# Patient Record
Sex: Male | Born: 1975
Health system: Southern US, Community
[De-identification: ages and names within clinical notes are randomized; demographics above are authoritative.]

## PROBLEM LIST (undated history)

## (undated) DIAGNOSIS — I700 Atherosclerosis of aorta: Secondary | ICD-10-CM

## (undated) DIAGNOSIS — E876 Hypokalemia: Secondary | ICD-10-CM

## (undated) DIAGNOSIS — K760 Fatty (change of) liver, not elsewhere classified: Secondary | ICD-10-CM

## (undated) DIAGNOSIS — R945 Abnormal results of liver function studies: Secondary | ICD-10-CM

## (undated) DIAGNOSIS — Z87442 Personal history of urinary calculi: Secondary | ICD-10-CM

## (undated) DIAGNOSIS — Z720 Tobacco use: Secondary | ICD-10-CM

## (undated) DIAGNOSIS — I10 Essential (primary) hypertension: Secondary | ICD-10-CM

## (undated) DIAGNOSIS — J45909 Unspecified asthma, uncomplicated: Secondary | ICD-10-CM

## (undated) DIAGNOSIS — Z7289 Other problems related to lifestyle: Secondary | ICD-10-CM

## (undated) DIAGNOSIS — F419 Anxiety disorder, unspecified: Secondary | ICD-10-CM

## (undated) DIAGNOSIS — F1090 Alcohol use, unspecified, uncomplicated: Secondary | ICD-10-CM

## (undated) DIAGNOSIS — R7989 Other specified abnormal findings of blood chemistry: Secondary | ICD-10-CM

---
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
## 1997-11-16 ENCOUNTER — Emergency Department (HOSPITAL_COMMUNITY): Admission: EM | Admit: 1997-11-16 | Discharge: 1997-11-16 | Payer: Self-pay | Admitting: Emergency Medicine

## 1998-01-13 ENCOUNTER — Emergency Department (HOSPITAL_COMMUNITY): Admission: EM | Admit: 1998-01-13 | Discharge: 1998-01-13 | Payer: Self-pay | Admitting: Emergency Medicine

## 1998-03-29 ENCOUNTER — Emergency Department (HOSPITAL_COMMUNITY): Admission: EM | Admit: 1998-03-29 | Discharge: 1998-03-30 | Payer: Self-pay

## 1998-04-10 ENCOUNTER — Emergency Department (HOSPITAL_COMMUNITY): Admission: EM | Admit: 1998-04-10 | Discharge: 1998-04-10 | Payer: Self-pay | Admitting: Emergency Medicine

## 1999-06-22 ENCOUNTER — Emergency Department (HOSPITAL_COMMUNITY): Admission: EM | Admit: 1999-06-22 | Discharge: 1999-06-22 | Payer: Self-pay | Admitting: Emergency Medicine

## 1999-06-22 ENCOUNTER — Encounter: Payer: Self-pay | Admitting: Emergency Medicine

## 1999-07-28 ENCOUNTER — Ambulatory Visit (HOSPITAL_COMMUNITY): Admission: RE | Admit: 1999-07-28 | Discharge: 1999-07-28 | Payer: Self-pay | Admitting: Family Medicine

## 1999-07-28 ENCOUNTER — Encounter: Payer: Self-pay | Admitting: Family Medicine

## 2000-03-01 ENCOUNTER — Emergency Department (HOSPITAL_COMMUNITY): Admission: EM | Admit: 2000-03-01 | Discharge: 2000-03-01 | Payer: Self-pay | Admitting: Emergency Medicine

## 2000-05-09 ENCOUNTER — Emergency Department (HOSPITAL_COMMUNITY): Admission: EM | Admit: 2000-05-09 | Discharge: 2000-05-09 | Payer: Self-pay | Admitting: *Deleted

## 2000-05-09 ENCOUNTER — Encounter: Payer: Self-pay | Admitting: Emergency Medicine

## 2000-09-11 ENCOUNTER — Emergency Department (HOSPITAL_COMMUNITY): Admission: EM | Admit: 2000-09-11 | Discharge: 2000-09-11 | Payer: Self-pay | Admitting: *Deleted

## 2000-09-11 ENCOUNTER — Encounter: Payer: Self-pay | Admitting: Emergency Medicine

## 2001-01-02 ENCOUNTER — Emergency Department (HOSPITAL_COMMUNITY): Admission: EM | Admit: 2001-01-02 | Discharge: 2001-01-02 | Payer: Self-pay | Admitting: Emergency Medicine

## 2001-02-06 ENCOUNTER — Encounter: Payer: Self-pay | Admitting: Emergency Medicine

## 2001-02-06 ENCOUNTER — Emergency Department (HOSPITAL_COMMUNITY): Admission: EM | Admit: 2001-02-06 | Discharge: 2001-02-06 | Payer: Self-pay | Admitting: Emergency Medicine

## 2001-07-25 ENCOUNTER — Emergency Department (HOSPITAL_COMMUNITY): Admission: EM | Admit: 2001-07-25 | Discharge: 2001-07-26 | Payer: Self-pay | Admitting: Emergency Medicine

## 2001-07-25 ENCOUNTER — Encounter: Payer: Self-pay | Admitting: Emergency Medicine

## 2001-10-30 ENCOUNTER — Emergency Department (HOSPITAL_COMMUNITY): Admission: EM | Admit: 2001-10-30 | Discharge: 2001-10-30 | Payer: Self-pay | Admitting: Emergency Medicine

## 2001-10-30 ENCOUNTER — Encounter: Payer: Self-pay | Admitting: Emergency Medicine

## 2011-05-05 HISTORY — PX: BUNIONECTOMY: SHX129

## 2017-07-20 ENCOUNTER — Ambulatory Visit (HOSPITAL_COMMUNITY)
Admission: EM | Admit: 2017-07-20 | Discharge: 2017-07-20 | Disposition: A | Discharge status: Home / Self Care | Payer: Self-pay | Attending: Family Medicine | Admitting: Family Medicine

## 2017-07-20 ENCOUNTER — Encounter (HOSPITAL_COMMUNITY): Payer: Self-pay | Admitting: Emergency Medicine

## 2017-07-20 DIAGNOSIS — I10 Essential (primary) hypertension: Secondary | ICD-10-CM

## 2017-07-20 HISTORY — DX: Essential (primary) hypertension: I10

## 2017-07-20 LAB — POCT I-STAT, CHEM 8
BUN: 17 mg/dL (ref 6–20)
CHLORIDE: 103 mmol/L (ref 101–111)
CREATININE: 1.1 mg/dL (ref 0.61–1.24)
Calcium, Ion: 1.16 mmol/L (ref 1.15–1.40)
Glucose, Bld: 91 mg/dL (ref 65–99)
HCT: 47 % (ref 39.0–52.0)
Hemoglobin: 16 g/dL (ref 13.0–17.0)
POTASSIUM: 3.5 mmol/L (ref 3.5–5.1)
Sodium: 141 mmol/L (ref 135–145)
TCO2: 26 mmol/L (ref 22–32)

## 2017-07-20 MED ORDER — AMLODIPINE BESYLATE 5 MG PO TABS: 5.0000 mg | ORAL_TABLET | Freq: Every day | ORAL | 0 refills | Status: DC

## 2017-07-20 NOTE — Discharge Instructions (Signed)
Your electrolytes and kidney function was normal. Start amlodipine as directed. I have attached some information for it. Start DASH diet to further reduce high blood pressure. Monitor your blood pressure on the medicine, and keep a log. Follow up with PCP for further evaluation needed.

## 2017-07-20 NOTE — ED Provider Notes (Signed)
[facility]    CSN: [id number] Arrival date & time: 07/20/17  1815     History   Chief Complaint Chief Complaint  Patient presents with  . Hypertension    HPI Derrick Hoffman is a 42 y.o. male.   42 year old male comes in for evaluation of hypertension.  Patient states was at his psychiatrist office yesterday, and was noted to have elevated blood pressure reading.  States he went to the pharmacy afterwards and  had continued increased blood pressure.  States he was diagnosed with hypertension while he was in prison a few years ago, and stopped taking the medicine after he was released.  Has not seen a doctor since.  He is unsure what medication he was on.  States insurance kicks in next month, and will be finding PCP for further evaluation, but would like to start on a medication during this process.   Has intermittent left chest pain that he associates with anxiety. No obvious aggravating or alleviating factor. Not associated with activity. Denies current chest pain. Denies shortness of breath, palpitations. Denies weakness, dizziness, syncope.       Past Medical History:  Diagnosis Date  . Hypertension     There are no active problems to display for this patient.   History reviewed. No pertinent surgical history.     Home Medications    Prior to Admission medications   Medication Sig Start Date End Date Taking? Authorizing Provider  amLODipine (NORVASC) 5 MG tablet Take 1 tablet (5 mg total) by mouth daily. 07/20/17   Belinda Fisher, PA-C    Family History History reviewed. No pertinent family history.  Social History Social History   Tobacco Use  . Smoking status: Current Every Day Smoker  . Smokeless tobacco: Never Used  Substance Use Topics  . Alcohol use: Yes    Frequency: Never  . Drug use: No     Allergies   Patient has no known allergies.   Review of Systems Review of Systems  Reason unable to perform ROS: See HPI as above.      Physical Exam Triage Vital Signs ED Triage Vitals [07/20/17 1856]  Enc Vitals Group     BP (!) 166/100     Pulse Rate 82     Resp 18     Temp 98.2 F (36.8 C)     Temp Source Oral     SpO2 100 %     Weight      Height      Head Circumference      Peak Flow      Pain Score 0     Pain Loc      Pain Edu?      Excl. in GC?    No data found.  Updated Vital Signs BP (!) 166/100 (BP Location: Left Arm)   Pulse 82   Temp 98.2 F (36.8 C) (Oral)   Resp 18   SpO2 100%   Physical Exam  Constitutional: He is oriented to person, place, and time. He appears well-developed and well-nourished. No distress.  HENT:  Head: Normocephalic and atraumatic.  Eyes: Conjunctivae are normal. Pupils are equal, round, and reactive to light.  Cardiovascular: Normal rate, regular rhythm and normal heart sounds. Exam reveals no gallop and no friction rub.  No murmur heard. Pulmonary/Chest: Effort normal and breath sounds normal. No stridor. No respiratory distress. He has no decreased breath sounds. He has no wheezes. He has no rhonchi. He has  no rales.  Neurological: He is alert and oriented to person, place, and time.    UC Treatments / Results  Labs (all labs ordered are listed, but only abnormal results are displayed) Labs Reviewed  POCT I-STAT, CHEM 8    EKG  EKG Interpretation None       Radiology No results found.  Procedures Procedures (including critical care time)  Medications Ordered in UC Medications - No data to display   Initial Impression / Assessment and Plan / UC Course  I have reviewed the triage vital signs and the nursing notes.  Pertinent labs & imaging results that were available during my care of the patient were reviewed by me and considered in my medical decision making (see chart for details).    I-STAT without abnormalities.  Will have patient start Norvasc for hypertension.  Information about Norvasc provided.  DASH diet provided.  Patient to  document blood pressure on medication.  Follow-up with PCP for further evaluation management needed.  Final Clinical Impressions(s) / UC Diagnoses   Final diagnoses:  Hypertension, unspecified type    ED Discharge Orders        Ordered    amLODipine (NORVASC) 5 MG tablet  Daily     07/20/17 2002       Belinda Fisher, Cordelia Poche 07/20/17 2006

## 2017-07-20 NOTE — ED Triage Notes (Signed)
Pt here with htn; pt sts hx of same supposed to take meds but not taking

## 2017-09-18 ENCOUNTER — Emergency Department (HOSPITAL_COMMUNITY)
Admission: EM | Admit: 2017-09-18 | Discharge: 2017-09-18 | Disposition: A | Discharge status: Home / Self Care | Payer: Self-pay | Attending: Emergency Medicine | Admitting: Emergency Medicine

## 2017-09-18 ENCOUNTER — Encounter (HOSPITAL_COMMUNITY): Payer: Self-pay

## 2017-09-18 ENCOUNTER — Other Ambulatory Visit: Payer: Self-pay

## 2017-09-18 DIAGNOSIS — Z5321 Procedure and treatment not carried out due to patient leaving prior to being seen by health care provider: Secondary | ICD-10-CM | POA: Insufficient documentation

## 2017-09-18 DIAGNOSIS — R1110 Vomiting, unspecified: Secondary | ICD-10-CM | POA: Insufficient documentation

## 2017-09-18 MED ORDER — ONDANSETRON 4 MG PO TBDP
4.0000 mg | ORAL_TABLET | Freq: Once | ORAL | Status: AC | PRN
  Administered 2017-09-18: 4 mg via ORAL
  Filled 2017-09-18: qty 1

## 2017-09-18 NOTE — ED Triage Notes (Signed)
He c/o "vomited all night". He is in no distress. Zofran ODT given at triage.

## 2017-09-18 NOTE — ED Notes (Signed)
Called pt for room replacement no response x2.

## 2017-09-18 NOTE — ED Notes (Signed)
Called pt for room placement no response. x3 

## 2017-09-18 NOTE — ED Notes (Signed)
Pt called for room replacement no response.

## 2018-01-05 ENCOUNTER — Emergency Department (HOSPITAL_COMMUNITY): Payer: 59 | Admitting: Certified Registered Nurse Anesthetist

## 2018-01-05 ENCOUNTER — Ambulatory Visit (HOSPITAL_COMMUNITY)
Admission: EM | Admit: 2018-01-05 | Discharge: 2018-01-06 | Disposition: A | Discharge status: Home / Self Care | Payer: 59 | Attending: Internal Medicine | Admitting: Internal Medicine

## 2018-01-05 ENCOUNTER — Other Ambulatory Visit: Payer: Self-pay

## 2018-01-05 ENCOUNTER — Encounter (HOSPITAL_COMMUNITY)
Admission: EM | Disposition: A | Discharge status: Home / Self Care | Payer: Self-pay | Source: Home / Self Care | Attending: Emergency Medicine

## 2018-01-05 ENCOUNTER — Emergency Department (HOSPITAL_COMMUNITY): Payer: 59

## 2018-01-05 ENCOUNTER — Encounter (HOSPITAL_COMMUNITY): Payer: Self-pay | Admitting: Emergency Medicine

## 2018-01-05 DIAGNOSIS — Z79899 Other long term (current) drug therapy: Secondary | ICD-10-CM | POA: Insufficient documentation

## 2018-01-05 DIAGNOSIS — Z880 Allergy status to penicillin: Secondary | ICD-10-CM | POA: Diagnosis not present

## 2018-01-05 DIAGNOSIS — K3589 Other acute appendicitis without perforation or gangrene: Secondary | ICD-10-CM

## 2018-01-05 DIAGNOSIS — K37 Unspecified appendicitis: Secondary | ICD-10-CM | POA: Diagnosis present

## 2018-01-05 DIAGNOSIS — Z9119 Patient's noncompliance with other medical treatment and regimen: Secondary | ICD-10-CM | POA: Diagnosis not present

## 2018-01-05 DIAGNOSIS — K3533 Acute appendicitis with perforation and localized peritonitis, with abscess: Secondary | ICD-10-CM | POA: Diagnosis not present

## 2018-01-05 DIAGNOSIS — I700 Atherosclerosis of aorta: Secondary | ICD-10-CM | POA: Diagnosis not present

## 2018-01-05 DIAGNOSIS — K449 Diaphragmatic hernia without obstruction or gangrene: Secondary | ICD-10-CM | POA: Diagnosis not present

## 2018-01-05 DIAGNOSIS — I10 Essential (primary) hypertension: Secondary | ICD-10-CM | POA: Insufficient documentation

## 2018-01-05 DIAGNOSIS — K760 Fatty (change of) liver, not elsewhere classified: Secondary | ICD-10-CM | POA: Insufficient documentation

## 2018-01-05 DIAGNOSIS — F17200 Nicotine dependence, unspecified, uncomplicated: Secondary | ICD-10-CM | POA: Diagnosis not present

## 2018-01-05 HISTORY — DX: Anxiety disorder, unspecified: F41.9

## 2018-01-05 HISTORY — DX: Personal history of urinary calculi: Z87.442

## 2018-01-05 HISTORY — DX: Unspecified asthma, uncomplicated: J45.909

## 2018-01-05 HISTORY — PX: LAPAROSCOPIC APPENDECTOMY: SHX408

## 2018-01-05 HISTORY — PX: APPENDECTOMY: SHX54

## 2018-01-05 LAB — COMPREHENSIVE METABOLIC PANEL
ALT: 90 U/L — AB (ref 0–44)
ANION GAP: 16 — AB (ref 5–15)
AST: 83 U/L — ABNORMAL HIGH (ref 15–41)
Albumin: 5 g/dL (ref 3.5–5.0)
Alkaline Phosphatase: 78 U/L (ref 38–126)
BILIRUBIN TOTAL: 1.5 mg/dL — AB (ref 0.3–1.2)
BUN: 19 mg/dL (ref 6–20)
CO2: 24 mmol/L (ref 22–32)
Calcium: 10 mg/dL (ref 8.9–10.3)
Chloride: 100 mmol/L (ref 98–111)
Creatinine, Ser: 1.64 mg/dL — ABNORMAL HIGH (ref 0.61–1.24)
GFR calc Af Amer: 59 mL/min — ABNORMAL LOW (ref >60)
GFR calc non Af Amer: 50 mL/min — ABNORMAL LOW (ref >60)
Glucose, Bld: 122 mg/dL — ABNORMAL HIGH (ref 70–99)
POTASSIUM: 3.3 mmol/L — AB (ref 3.5–5.1)
Sodium: 140 mmol/L (ref 135–145)
Total Protein: 9 g/dL — ABNORMAL HIGH (ref 6.5–8.1)

## 2018-01-05 LAB — URINALYSIS, ROUTINE W REFLEX MICROSCOPIC
BACTERIA UA: NONE SEEN
Bilirubin Urine: NEGATIVE
GLUCOSE, UA: NEGATIVE mg/dL
Hgb urine dipstick: NEGATIVE
KETONES UR: 5 mg/dL — AB
Nitrite: NEGATIVE
PROTEIN: 100 mg/dL — AB
Specific Gravity, Urine: 1.028 (ref 1.005–1.030)
pH: 5 (ref 5.0–8.0)

## 2018-01-05 LAB — LIPASE, BLOOD: LIPASE: 45 U/L (ref 11–51)

## 2018-01-05 LAB — CBC
HEMATOCRIT: 54.1 % — AB (ref 39.0–52.0)
HEMOGLOBIN: 18 g/dL — AB (ref 13.0–17.0)
MCH: 32.6 pg (ref 26.0–34.0)
MCHC: 33.3 g/dL (ref 30.0–36.0)
MCV: 98 fL (ref 78.0–100.0)
Platelets: 284 10*3/uL (ref 150–400)
RBC: 5.52 MIL/uL (ref 4.22–5.81)
RDW: 14.3 % (ref 11.5–15.5)
WBC: 14 10*3/uL — ABNORMAL HIGH (ref 4.0–10.5)

## 2018-01-05 SURGERY — APPENDECTOMY, LAPAROSCOPIC
Anesthesia: General | Site: Abdomen

## 2018-01-05 MED ORDER — SODIUM CHLORIDE 0.9 % IV SOLN
2.0000 g | Freq: Once | INTRAVENOUS | Status: AC
  Administered 2018-01-05: 2 g via INTRAVENOUS
  Filled 2018-01-05: qty 20

## 2018-01-05 MED ORDER — HYDROCODONE-ACETAMINOPHEN 5-325 MG PO TABS
1.0000 | ORAL_TABLET | ORAL | Status: DC | PRN
  Administered 2018-01-05: 2 via ORAL
  Administered 2018-01-05: 1 via ORAL
  Administered 2018-01-06 (×2): 2 via ORAL
  Filled 2018-01-05: qty 1
  Filled 2018-01-05 (×4): qty 2

## 2018-01-05 MED ORDER — PANTOPRAZOLE SODIUM 40 MG PO TBEC
40.0000 mg | DELAYED_RELEASE_TABLET | Freq: Every day | ORAL | Status: DC
  Administered 2018-01-05 – 2018-01-06 (×2): 40 mg via ORAL
  Filled 2018-01-05 (×2): qty 1

## 2018-01-05 MED ORDER — FAMOTIDINE IN NACL 20-0.9 MG/50ML-% IV SOLN: 20.0000 mg | Freq: Two times a day (BID) | INTRAVENOUS | Status: DC

## 2018-01-05 MED ORDER — FENTANYL CITRATE (PF) 250 MCG/5ML IJ SOLN
INTRAMUSCULAR | Status: DC | PRN
  Administered 2018-01-05 (×2): 100 ug via INTRAVENOUS
  Administered 2018-01-05: 50 ug via INTRAVENOUS

## 2018-01-05 MED ORDER — POTASSIUM CHLORIDE IN NACL 40-0.9 MEQ/L-% IV SOLN: INTRAVENOUS | Status: DC

## 2018-01-05 MED ORDER — ROCURONIUM BROMIDE 10 MG/ML (PF) SYRINGE
PREFILLED_SYRINGE | INTRAVENOUS | Status: DC | PRN
  Administered 2018-01-05: 50 mg via INTRAVENOUS

## 2018-01-05 MED ORDER — SUGAMMADEX SODIUM 200 MG/2ML IV SOLN
INTRAVENOUS | Status: DC | PRN
  Administered 2018-01-05: 200 mg via INTRAVENOUS

## 2018-01-05 MED ORDER — BUPIVACAINE-EPINEPHRINE 0.25% -1:200000 IJ SOLN
INTRAMUSCULAR | Status: DC | PRN
  Administered 2018-01-05: 20 mL

## 2018-01-05 MED ORDER — LIDOCAINE 2% (20 MG/ML) 5 ML SYRINGE
INTRAMUSCULAR | Status: DC | PRN
  Administered 2018-01-05: 60 mg via INTRAVENOUS

## 2018-01-05 MED ORDER — METOPROLOL TARTRATE 5 MG/5ML IV SOLN
5.0000 mg | INTRAVENOUS | Status: DC | PRN
  Administered 2018-01-05 (×4): 5 mg via INTRAVENOUS

## 2018-01-05 MED ORDER — MORPHINE SULFATE (PF) 4 MG/ML IV SOLN
4.0000 mg | Freq: Once | INTRAVENOUS | Status: AC
  Administered 2018-01-05: 4 mg via INTRAVENOUS
  Filled 2018-01-05: qty 1

## 2018-01-05 MED ORDER — DIPHENHYDRAMINE HCL 50 MG/ML IJ SOLN: 25.0000 mg | Freq: Four times a day (QID) | INTRAMUSCULAR | Status: DC | PRN

## 2018-01-05 MED ORDER — METOPROLOL TARTRATE 5 MG/5ML IV SOLN
INTRAVENOUS | Status: AC
  Filled 2018-01-05: qty 5

## 2018-01-05 MED ORDER — LORAZEPAM 2 MG/ML IJ SOLN: 0.5000 mg | Freq: Once | INTRAMUSCULAR | Status: DC

## 2018-01-05 MED ORDER — HYDRALAZINE HCL 20 MG/ML IJ SOLN: 5.0000 mg | INTRAMUSCULAR | Status: DC | PRN

## 2018-01-05 MED ORDER — ONDANSETRON HCL 4 MG/2ML IJ SOLN
INTRAMUSCULAR | Status: AC
Start: 2018-01-05 — End: 2018-01-05
  Filled 2018-01-05: qty 2

## 2018-01-05 MED ORDER — FENTANYL CITRATE (PF) 250 MCG/5ML IJ SOLN
INTRAMUSCULAR | Status: AC
  Filled 2018-01-05: qty 5

## 2018-01-05 MED ORDER — SUCCINYLCHOLINE CHLORIDE 200 MG/10ML IV SOSY
PREFILLED_SYRINGE | INTRAVENOUS | Status: DC | PRN
  Administered 2018-01-05: 100 mg via INTRAVENOUS

## 2018-01-05 MED ORDER — METOPROLOL TARTRATE 25 MG PO TABS
25.0000 mg | ORAL_TABLET | Freq: Two times a day (BID) | ORAL | Status: DC
  Administered 2018-01-05 – 2018-01-06 (×3): 25 mg via ORAL
  Filled 2018-01-05 (×3): qty 1

## 2018-01-05 MED ORDER — ONDANSETRON 4 MG PO TBDP: 4.0000 mg | ORAL_TABLET | Freq: Four times a day (QID) | ORAL | Status: DC | PRN

## 2018-01-05 MED ORDER — METRONIDAZOLE IN NACL 5-0.79 MG/ML-% IV SOLN
500.0000 mg | Freq: Once | INTRAVENOUS | Status: AC
  Administered 2018-01-05: 500 mg via INTRAVENOUS
  Filled 2018-01-05 (×2): qty 100

## 2018-01-05 MED ORDER — SERTRALINE HCL 50 MG PO TABS
50.0000 mg | ORAL_TABLET | Freq: Every day | ORAL | Status: DC
  Administered 2018-01-05 – 2018-01-06 (×2): 50 mg via ORAL
  Filled 2018-01-05 (×2): qty 1

## 2018-01-05 MED ORDER — HEPARIN SODIUM (PORCINE) 5000 UNIT/ML IJ SOLN
5000.0000 [IU] | Freq: Three times a day (TID) | INTRAMUSCULAR | Status: DC
  Administered 2018-01-06: 5000 [IU] via SUBCUTANEOUS
  Filled 2018-01-05: qty 1

## 2018-01-05 MED ORDER — HYDRALAZINE HCL 20 MG/ML IJ SOLN
INTRAMUSCULAR | Status: AC
  Filled 2018-01-05: qty 1

## 2018-01-05 MED ORDER — 0.9 % SODIUM CHLORIDE (POUR BTL) OPTIME
TOPICAL | Status: DC | PRN
  Administered 2018-01-05: 1000 mL

## 2018-01-05 MED ORDER — SODIUM CHLORIDE 0.9 % IV BOLUS
1000.0000 mL | Freq: Once | INTRAVENOUS | Status: AC
  Administered 2018-01-05: 1000 mL via INTRAVENOUS

## 2018-01-05 MED ORDER — HYDROXYZINE HCL 50 MG PO TABS
50.0000 mg | ORAL_TABLET | Freq: Three times a day (TID) | ORAL | Status: DC | PRN
  Administered 2018-01-05: 50 mg via ORAL
  Filled 2018-01-05 (×2): qty 1

## 2018-01-05 MED ORDER — FAMOTIDINE IN NACL 20-0.9 MG/50ML-% IV SOLN
20.0000 mg | Freq: Two times a day (BID) | INTRAVENOUS | Status: DC
  Administered 2018-01-05: 20 mg via INTRAVENOUS
  Filled 2018-01-05: qty 50

## 2018-01-05 MED ORDER — HYDROMORPHONE HCL 1 MG/ML IJ SOLN
0.5000 mg | Freq: Once | INTRAMUSCULAR | Status: AC
  Administered 2018-01-05: 0.5 mg via INTRAVENOUS
  Filled 2018-01-05: qty 1

## 2018-01-05 MED ORDER — HYDRALAZINE HCL 20 MG/ML IJ SOLN
10.0000 mg | INTRAMUSCULAR | Status: DC | PRN
  Administered 2018-01-05: 5 mg via INTRAVENOUS
  Filled 2018-01-05: qty 1

## 2018-01-05 MED ORDER — METOPROLOL TARTRATE 5 MG/5ML IV SOLN
INTRAVENOUS | Status: AC
  Filled 2018-01-05: qty 10

## 2018-01-05 MED ORDER — AMLODIPINE BESYLATE 5 MG PO TABS
5.0000 mg | ORAL_TABLET | Freq: Every day | ORAL | Status: DC
  Administered 2018-01-05 – 2018-01-06 (×2): 5 mg via ORAL
  Filled 2018-01-05 (×2): qty 1

## 2018-01-05 MED ORDER — IOPAMIDOL (ISOVUE-300) INJECTION 61%
INTRAVENOUS | Status: AC
  Administered 2018-01-05: 100 mL
  Filled 2018-01-05: qty 100

## 2018-01-05 MED ORDER — OXYCODONE HCL 5 MG PO TABS
ORAL_TABLET | ORAL | Status: AC
  Filled 2018-01-05: qty 2

## 2018-01-05 MED ORDER — ONDANSETRON HCL 4 MG/2ML IJ SOLN
INTRAMUSCULAR | Status: DC | PRN
  Administered 2018-01-05: 4 mg via INTRAVENOUS

## 2018-01-05 MED ORDER — MIDAZOLAM HCL 2 MG/2ML IJ SOLN
INTRAMUSCULAR | Status: AC
  Filled 2018-01-05: qty 2

## 2018-01-05 MED ORDER — ACETAMINOPHEN 500 MG PO TABS: 1000.0000 mg | ORAL_TABLET | Freq: Four times a day (QID) | ORAL | Status: DC

## 2018-01-05 MED ORDER — HYDROMORPHONE HCL 1 MG/ML IJ SOLN
0.5000 mg | INTRAMUSCULAR | Status: DC | PRN
  Administered 2018-01-05: 1 mg via INTRAVENOUS
  Administered 2018-01-05: 0.5 mg via INTRAVENOUS
  Filled 2018-01-05 (×3): qty 1

## 2018-01-05 MED ORDER — HYDROMORPHONE HCL 1 MG/ML IJ SOLN
INTRAMUSCULAR | Status: AC
  Administered 2018-01-05: 1 mg
  Filled 2018-01-05: qty 1

## 2018-01-05 MED ORDER — DEXAMETHASONE SODIUM PHOSPHATE 10 MG/ML IJ SOLN
INTRAMUSCULAR | Status: AC
  Filled 2018-01-05: qty 1

## 2018-01-05 MED ORDER — HYDRALAZINE HCL 20 MG/ML IJ SOLN
5.0000 mg | INTRAMUSCULAR | Status: DC | PRN
  Administered 2018-01-05 (×2): 5 mg via INTRAVENOUS

## 2018-01-05 MED ORDER — DOCUSATE SODIUM 100 MG PO CAPS
100.0000 mg | ORAL_CAPSULE | Freq: Two times a day (BID) | ORAL | Status: DC
  Administered 2018-01-05 – 2018-01-06 (×2): 100 mg via ORAL
  Filled 2018-01-05 (×2): qty 1

## 2018-01-05 MED ORDER — KCL IN DEXTROSE-NACL 20-5-0.9 MEQ/L-%-% IV SOLN
INTRAVENOUS | Status: DC
  Administered 2018-01-05: 15:00:00 via INTRAVENOUS
  Filled 2018-01-05 (×2): qty 1000

## 2018-01-05 MED ORDER — PROPOFOL 10 MG/ML IV BOLUS
INTRAVENOUS | Status: AC
  Filled 2018-01-05: qty 20

## 2018-01-05 MED ORDER — MIDAZOLAM HCL 5 MG/5ML IJ SOLN
INTRAMUSCULAR | Status: DC | PRN
  Administered 2018-01-05: 2 mg via INTRAVENOUS

## 2018-01-05 MED ORDER — HYDRALAZINE HCL 20 MG/ML IJ SOLN
5.0000 mg | Freq: Once | INTRAMUSCULAR | Status: AC
  Administered 2018-01-05: 5 mg via INTRAVENOUS

## 2018-01-05 MED ORDER — GABAPENTIN 300 MG PO CAPS
300.0000 mg | ORAL_CAPSULE | ORAL | Status: AC
  Administered 2018-01-05: 300 mg via ORAL
  Filled 2018-01-05: qty 1

## 2018-01-05 MED ORDER — MORPHINE SULFATE (PF) 2 MG/ML IV SOLN
1.0000 mg | INTRAVENOUS | Status: DC | PRN
  Administered 2018-01-05: 2 mg via INTRAVENOUS
  Filled 2018-01-05: qty 1

## 2018-01-05 MED ORDER — ACETAMINOPHEN 500 MG PO TABS
1000.0000 mg | ORAL_TABLET | ORAL | Status: AC
  Administered 2018-01-05: 1000 mg via ORAL
  Filled 2018-01-05: qty 2

## 2018-01-05 MED ORDER — SODIUM CHLORIDE 0.9 % IR SOLN
Status: DC | PRN
  Administered 2018-01-05: 1

## 2018-01-05 MED ORDER — METOPROLOL TARTRATE 5 MG/5ML IV SOLN
5.0000 mg | Freq: Once | INTRAVENOUS | Status: AC
  Administered 2018-01-05: 5 mg via INTRAVENOUS
  Filled 2018-01-05: qty 5

## 2018-01-05 MED ORDER — ONDANSETRON HCL 4 MG/2ML IJ SOLN: 4.0000 mg | Freq: Four times a day (QID) | INTRAMUSCULAR | Status: DC | PRN

## 2018-01-05 MED ORDER — HYDROMORPHONE HCL 1 MG/ML IJ SOLN
1.0000 mg | Freq: Once | INTRAMUSCULAR | Status: AC
  Administered 2018-01-05: 1 mg via INTRAVENOUS
  Filled 2018-01-05: qty 1

## 2018-01-05 MED ORDER — PROPOFOL 10 MG/ML IV BOLUS
INTRAVENOUS | Status: DC | PRN
  Administered 2018-01-05: 200 mg via INTRAVENOUS

## 2018-01-05 MED ORDER — HYDROMORPHONE HCL 1 MG/ML IJ SOLN
0.2500 mg | INTRAMUSCULAR | Status: DC | PRN
  Administered 2018-01-05 (×2): 0.5 mg via INTRAVENOUS

## 2018-01-05 MED ORDER — ONDANSETRON HCL 4 MG/2ML IJ SOLN
4.0000 mg | Freq: Once | INTRAMUSCULAR | Status: AC
  Administered 2018-01-05: 4 mg via INTRAVENOUS
  Filled 2018-01-05: qty 2

## 2018-01-05 MED ORDER — DEXAMETHASONE SODIUM PHOSPHATE 10 MG/ML IJ SOLN
INTRAMUSCULAR | Status: DC | PRN
  Administered 2018-01-05: 10 mg via INTRAVENOUS

## 2018-01-05 MED ORDER — LACTATED RINGERS IV SOLN
INTRAVENOUS | Status: DC
  Administered 2018-01-05: 10:00:00 via INTRAVENOUS

## 2018-01-05 MED ORDER — DIPHENHYDRAMINE HCL 25 MG PO CAPS: 25.0000 mg | ORAL_CAPSULE | Freq: Four times a day (QID) | ORAL | Status: DC | PRN

## 2018-01-05 MED ORDER — OXYCODONE HCL 5 MG PO TABS
5.0000 mg | ORAL_TABLET | ORAL | Status: DC | PRN
  Administered 2018-01-05 – 2018-01-06 (×2): 10 mg via ORAL
  Filled 2018-01-05: qty 2

## 2018-01-05 MED ORDER — ONDANSETRON HCL 4 MG/2ML IJ SOLN
INTRAMUSCULAR | Status: AC
  Filled 2018-01-05: qty 2

## 2018-01-05 MED ORDER — BUPIVACAINE-EPINEPHRINE (PF) 0.25% -1:200000 IJ SOLN
INTRAMUSCULAR | Status: AC
  Filled 2018-01-05: qty 30

## 2018-01-05 SURGICAL SUPPLY — 34 items
APPLIER CLIP ROT 10 11.4 M/L (STAPLE)
BLADE CLIPPER SURG (BLADE) IMPLANT
CANISTER SUCT 3000ML PPV (MISCELLANEOUS) ×3 IMPLANT
CHLORAPREP W/TINT 26ML (MISCELLANEOUS) ×3 IMPLANT
CLIP APPLIE ROT 10 11.4 M/L (STAPLE) IMPLANT
COVER SURGICAL LIGHT HANDLE (MISCELLANEOUS) ×3 IMPLANT
CUTTER FLEX LINEAR 45M (STAPLE) ×3 IMPLANT
DERMABOND ADVANCED (GAUZE/BANDAGES/DRESSINGS) ×2
DERMABOND ADVANCED .7 DNX12 (GAUZE/BANDAGES/DRESSINGS) ×1 IMPLANT
ELECT REM PT RETURN 9FT ADLT (ELECTROSURGICAL) ×3
ELECTRODE REM PT RTRN 9FT ADLT (ELECTROSURGICAL) ×1 IMPLANT
ENDOLOOP SUT PDS II  0 18 (SUTURE)
ENDOLOOP SUT PDS II 0 18 (SUTURE) IMPLANT
GLOVE BIO SURGEON STRL SZ7.5 (GLOVE) ×3 IMPLANT
GOWN STRL REUS W/ TWL LRG LVL3 (GOWN DISPOSABLE) ×3 IMPLANT
GOWN STRL REUS W/TWL LRG LVL3 (GOWN DISPOSABLE) ×6
KIT BASIN OR (CUSTOM PROCEDURE TRAY) ×3 IMPLANT
KIT TURNOVER KIT B (KITS) ×3 IMPLANT
NS IRRIG 1000ML POUR BTL (IV SOLUTION) ×3 IMPLANT
PAD ARMBOARD 7.5X6 YLW CONV (MISCELLANEOUS) ×6 IMPLANT
POUCH SPECIMEN RETRIEVAL 10MM (ENDOMECHANICALS) ×3 IMPLANT
RELOAD STAPLE TA45 3.5 REG BLU (ENDOMECHANICALS) ×3 IMPLANT
SET IRRIG TUBING LAPAROSCOPIC (IRRIGATION / IRRIGATOR) ×3 IMPLANT
SHEARS HARMONIC ACE PLUS 36CM (ENDOMECHANICALS) ×3 IMPLANT
SPECIMEN JAR SMALL (MISCELLANEOUS) ×3 IMPLANT
SUT MNCRL AB 4-0 PS2 18 (SUTURE) ×3 IMPLANT
TOWEL OR 17X24 6PK STRL BLUE (TOWEL DISPOSABLE) ×3 IMPLANT
TOWEL OR 17X26 10 PK STRL BLUE (TOWEL DISPOSABLE) ×3 IMPLANT
TRAY FOLEY CATH SILVER 16FR (SET/KITS/TRAYS/PACK) ×3 IMPLANT
TRAY LAPAROSCOPIC MC (CUSTOM PROCEDURE TRAY) ×3 IMPLANT
TROCAR XCEL BLUNT TIP 100MML (ENDOMECHANICALS) ×3 IMPLANT
TROCAR XCEL NON-BLD 5MMX100MML (ENDOMECHANICALS) ×6 IMPLANT
TUBING INSUFFLATION (TUBING) ×3 IMPLANT
WATER STERILE IRR 1000ML POUR (IV SOLUTION) ×3 IMPLANT

## 2018-01-05 NOTE — Discharge Instructions (Signed)
Please arrive at least 30 min before your appointment to complete your check in paperwork.  If you are unable to arrive 30 min prior to your appointment time we may have to cancel or reschedule you. ° °LAPAROSCOPIC SURGERY: POST OP INSTRUCTIONS  °1. DIET: Follow a light bland diet the first 24 hours after arrival home, such as soup, liquids, crackers, etc. Be sure to include lots of fluids daily. Avoid fast food or heavy meals as your are more likely to get nauseated. Eat a low fat the next few days after surgery.  °2. Take your usually prescribed home medications unless otherwise directed. °3. PAIN CONTROL:  °1. Pain is best controlled by a usual combination of three different methods TOGETHER:  °1. Ice/Heat °2. Over the counter pain medication °3. Prescription pain medication °2. Most patients will experience some swelling and bruising around the incisions. Ice packs or heating pads (30-60 minutes up to 6 times a day) will help. Use ice for the first few days to help decrease swelling and bruising, then switch to heat to help relax tight/sore spots and speed recovery. Some people prefer to use ice alone, heat alone, alternating between ice & heat. Experiment to what works for you. Swelling and bruising can take several weeks to resolve.  °3. It is helpful to take an over-the-counter pain medication regularly for the first few weeks. Choose one of the following that works best for you:  °1. Naproxen (Aleve, etc) Two 220mg tabs twice a day °2. Ibuprofen (Advil, etc) Three 200mg tabs four times a day (every meal & bedtime) °3. Acetaminophen (Tylenol, etc) 500-650mg four times a day (every meal & bedtime) °4. A prescription for pain medication (such as oxycodone, hydrocodone, etc) should be given to you upon discharge. Take your pain medication as prescribed.  °1. If you are having problems/concerns with the prescription medicine (does not control pain, nausea, vomiting, rash, itching, etc), please call us [phone] to see if we need to switch you to a different pain medicine that will work better for you and/or control your side effect better. °2. If you need a refill on your pain medication, please contact your pharmacy. They will contact our office to request authorization. Prescriptions will not be filled after 5 pm or on week-ends. °4. Avoid getting constipated. Between the surgery and the pain medications, it is common to experience some constipation. Increasing fluid intake and taking a fiber supplement (such as Metamucil, Citrucel, FiberCon, MiraLax, etc) 1-2 times a day regularly will usually help prevent this problem from occurring. A mild laxative (prune juice, Milk of Magnesia, MiraLax, etc) should be taken according to package directions if there are no bowel movements after 48 hours.  °5. Watch out for diarrhea. If you have many loose bowel movements, simplify your diet to bland foods & liquids for a few days. Stop any stool softeners and decrease your fiber supplement. Switching to mild anti-diarrheal medications (Kayopectate, Pepto Bismol) can help. If this worsens or does not improve, please call us. °6. Wash / shower every day. You may shower over the dressings as they are waterproof. Continue to shower over incision(s) after the dressing is off. °7. Remove your waterproof bandages 5 days after surgery. You may leave the incision open to air. You may replace a dressing/Band-Aid to cover the incision for comfort if you wish.  °8. ACTIVITIES as tolerated:  °1. You may resume regular (light) daily activities beginning the next day--such as daily self-care, walking, climbing stairs--gradually   increasing activities as tolerated. If you can walk 30 minutes without difficulty, it is safe to try more intense activity such as jogging, treadmill, bicycling, low-impact aerobics, swimming, etc. °2. Save the most intensive and strenuous activity for last such as sit-ups, heavy lifting, contact sports, etc Refrain  from any heavy lifting or straining until you are off narcotics for pain control.  °3. DO NOT PUSH THROUGH PAIN. Let pain be your guide: If it hurts to do something, don't do it. Pain is your body warning you to avoid that activity for another week until the pain goes down. °4. You may drive when you are no longer taking prescription pain medication, you can comfortably wear a seatbelt, and you can safely maneuver your car and apply brakes. °5. You may have sexual intercourse when it is comfortable.  °9. FOLLOW UP in our office  °1. Please call [facility] at [phone] to set up an appointment to see your surgeon in the office for a follow-up appointment approximately 2-3 weeks after your surgery. °2. Make sure that you call for this appointment the day you arrive home to insure a convenient appointment time. °     10. IF YOU HAVE DISABILITY OR FAMILY LEAVE FORMS, BRING THEM TO THE               OFFICE FOR PROCESSING.  ° °WHEN TO CALL US [phone]:  °1. Poor pain control °2. Reactions / problems with new medications (rash/itching, nausea, etc)  °3. Fever over 101.5 F (38.5 C) °4. Inability to urinate °5. Nausea and/or vomiting °6. Worsening swelling or bruising °7. Continued bleeding from incision. °8. Increased pain, redness, or drainage from the incision ° °The clinic staff is available to answer your questions during regular business hours (8:30am-5pm). Please don’t hesitate to call and ask to speak to one of our nurses for clinical concerns.  °If you have a medical emergency, go to the nearest emergency room or call 911.  °A surgeon from [facility] is always on call at the hospitals  ° °[facility]  °[address], [address], [place], [place] [address] ?  °MAIN: [phone] ? TOLL FREE: [phone] ?  °FAX [phone]  °[email or web address] ° °

## 2018-01-05 NOTE — ED Notes (Signed)
Pt c/o severe abd pain- extremely diaphoretic, gown and sheets changed. Denies drinking etoh daily, last drink yesterday-- states "I don't drink like that anymore"

## 2018-01-05 NOTE — Anesthesia Preprocedure Evaluation (Addendum)
Anesthesia Evaluation  Patient identified by MRN, date of birth, ID band Patient awake    Reviewed: Allergy & Precautions, H&P , NPO status , Patient's Chart, lab work & pertinent test results  Airway Mallampati: II  TM Distance: >3 FB Neck ROM: Full    Dental no notable dental hx. (+) Teeth Intact, Dental Advisory Given   Pulmonary Current Smoker,    Pulmonary exam normal breath sounds clear to auscultation       Cardiovascular hypertension, Pt. on medications  Rhythm:Regular Rate:Normal     Neuro/Psych negative neurological ROS  negative psych ROS   GI/Hepatic negative GI ROS, Neg liver ROS,   Endo/Other  negative endocrine ROS  Renal/GU negative Renal ROS  negative genitourinary   Musculoskeletal   Abdominal   Peds  Hematology negative hematology ROS (+)   Anesthesia Other Findings   Reproductive/Obstetrics negative OB ROS                            Anesthesia Physical Anesthesia Plan  ASA: II and emergent  Anesthesia Plan: General   Post-op Pain Management:    Induction: Intravenous, Rapid sequence and Cricoid pressure planned  PONV Risk Score and Plan: 2 and Ondansetron, Dexamethasone and Midazolam  Airway Management Planned: Oral ETT  Additional Equipment:   Intra-op Plan:   Post-operative Plan: Extubation in OR  Informed Consent: I have reviewed the patients History and Physical, chart, labs and discussed the procedure including the risks, benefits and alternatives for the proposed anesthesia with the patient or authorized representative who has indicated his/her understanding and acceptance.   Dental advisory given  Plan Discussed with: CRNA  Anesthesia Plan Comments:        Anesthesia Quick Evaluation

## 2018-01-05 NOTE — Anesthesia Procedure Notes (Signed)
Procedure Name: Intubation Date/Time: 01/05/2018 10:45 AM Performed by: Colin Benton, CRNA Pre-anesthesia Checklist: Patient identified, Emergency Drugs available, Suction available and Patient being monitored Patient Re-evaluated:Patient Re-evaluated prior to induction Oxygen Delivery Method: Circle system utilized Preoxygenation: Pre-oxygenation with 100% oxygen Induction Type: IV induction, Rapid sequence and Cricoid Pressure applied Laryngoscope Size: Mac and 4 Grade View: Grade I Tube type: Oral Tube size: 8.0 mm Number of attempts: 1 Airway Equipment and Method: Stylet Placement Confirmation: ETT inserted through vocal cords under direct vision,  positive ETCO2 and breath sounds checked- equal and bilateral Secured at: 25 cm Tube secured with: Tape Dental Injury: Teeth and Oropharynx as per pre-operative assessment

## 2018-01-05 NOTE — ED Triage Notes (Signed)
Pt went to PCP for abd pains earlier today, was told it was reflux however after taking medication came here b/c it did not work.  Pt reports gas, n/v.

## 2018-01-05 NOTE — Op Note (Signed)
01/05/2018  11:43 AM  PATIENT:  [name]  42 y.o. male  PRE-OPERATIVE DIAGNOSIS:  appendicitis  POST-OPERATIVE DIAGNOSIS:  appendicitis  PROCEDURE:  Procedure(s): APPENDECTOMY LAPAROSCOPIC (N/A)  SURGEON:  Surgeon(s) and Role:    * Griselda Miner, MD - Primary  PHYSICIAN ASSISTANT:   ASSISTANTS: none   ANESTHESIA:   local and general  EBL:  15 mL   BLOOD ADMINISTERED:none  DRAINS: none   LOCAL MEDICATIONS USED:  MARCAINE     SPECIMEN:  Source of Specimen:  appendix  DISPOSITION OF SPECIMEN:  PATHOLOGY  COUNTS:  YES  TOURNIQUET:  * No tourniquets in log *  DICTATION: .Dragon Dictation   After informed consent was obtained patient was brought to the operating room placed in the supine position on the operating room table. After adequate induction of general anesthesia the patient's abdomen was prepped with ChloraPrep, allowed to dry, and draped in usual sterile manner. An appropriate timeout was performed. The area below the umbilicus was infiltrated with quarter percent Marcaine. A small incision was made with a 15 blade knife. This incision was carried down through the subcutaneous tissue bluntly with a hemostat and [employer] retractors until the linea alba was identified. The linea alba was incised with a 15 blade knife. Each side was grasped Coker clamps and elevated anteriorly. The preperitoneal space was probed bluntly with a hemostat until the peritoneum was opened and access was gained to the abdominal cavity. A 0 Vicryl purse string stitch was placed in the fascia surrounding the opening. A Hassan cannula was placed through the opening and anchored in place with the previously placed Vicryl purse string stitch. The laparoscope was placed through the [facility] cannula. The abdomen was insufflated with carbon dioxide without difficulty. Next the suprapubic area was infiltrated with quarter percent Marcaine. A small incision was made with a 15 blade knife. A 5 mm port was  placed bluntly through this incision into the abdominal cavity. A site was then chosen between the 2 port for placement of a 5 mm port. The area was infiltrated with quarter percent Marcaine. A small stab incision was made with a 15 blade knife. A 5 mm port was placed bluntly through this incision and the abdominal cavity under direct vision. The laparoscope was then moved to the suprapubic port. Using a Glassman grasper and harmonic scalpel the right lower quadrant was inspected. The appendix was readily identified. The appendix was elevated anteriorly and the mesoappendix was taken down sharply with the harmonic scalpel. Once the base of the appendix where it joined the cecum was identified and cleared of any tissue then a laparoscopic GIA blue load 6 row stapler was placed through the [facility] cannula. The stapler was placed across the base of the appendix clamped and fired thereby dividing the base of the appendix between staple lines. A laparoscopic bag was then inserted through the [facility] cannula. The appendix was placed within the bag and the bag was sealed. The abdomen was then irrigated with copious amounts of saline until the effluent was clear. No other abnormalities were noted. The appendix and bag were removed with the [facility] cannula through the infraumbilical port without difficulty. The fascial defect was closed with the previously placed Vicryl pursestring stitch as well as with another interrupted 0 Vicryl figure-of-eight stitch. The rest of the ports were removed under direct vision and were found to be hemostatic. The gas was allowed to escape. The skin incisions were closed with interrupted 4-0 Monocryl subcuticular stitches.  Dermabond dressings were applied. The patient tolerated the procedure well. At the end of the case all needle sponge and instrument counts were correct. The patient was then awakened and taken to recovery in stable condition.  PLAN OF CARE: Admit for overnight  observation  PATIENT DISPOSITION:  PACU - hemodynamically stable.   Delay start of Pharmacological VTE agent (>24hrs) due to surgical blood loss or risk of bleeding: no

## 2018-01-05 NOTE — ED Notes (Signed)
Patient transported to CT scan . 

## 2018-01-05 NOTE — Progress Notes (Signed)
Patient's BP 159/100. Paged on call MD- Dr. Lindie Spruce. Received order to administer prn Hydralazine 5mg  in stead of 10mg . Will continue to monitor.

## 2018-01-05 NOTE — ED Provider Notes (Signed)
[facility] EMERGENCY DEPARTMENT Provider Note   CSN: [id number] Arrival date & time: 01/05/18  0454     History   Chief Complaint Chief Complaint  Patient presents with  . Abdominal Pain    HPI Derrick Hoffman is a 42 y.o. male with history of hypertension who presents with a 2 to 3-day history of left lower quadrant pain.  Patient has had associated nausea, vomiting, and began with a few episodes of nonbloody diarrhea.  Patient reports cold chills.  He denies any documented fever at home.  He denies any urinary symptoms.  He has some intermittent mild back pain.  Denies any chest pain, shortness of breath.  Patient has history of kidney stones, however he is unsure if this feels similar.  He reports going to his PCP yesterday who told him it was reflux and he reportedly took Prilosec for reflux which did not help.  HPI  Past Medical History:  Diagnosis Date  . Hypertension     There are no active problems to display for this patient.   History reviewed. No pertinent surgical history.      Home Medications    Prior to Admission medications   Medication Sig Start Date End Date Taking? Authorizing Provider  amLODipine (NORVASC) 5 MG tablet Take 1 tablet (5 mg total) by mouth daily. 07/20/17  Yes Yu, Amy V, PA-C  hydrOXYzine (ATARAX/VISTARIL) 50 MG tablet Take 50 mg by mouth 3 (three) times daily as needed for anxiety.   Yes [provider]  omeprazole (PRILOSEC) 40 MG capsule Take 40 mg by mouth daily.   Yes [provider]  sertraline (ZOLOFT) 50 MG tablet Take 50 mg by mouth daily.   Yes [provider]    Family History History reviewed. No pertinent family history.  Social History Social History   Tobacco Use  . Smoking status: Current Every Day Smoker  . Smokeless tobacco: Never Used  Substance Use Topics  . Alcohol use: Yes    Frequency: Never    Comment: drinks daily- 5th of liquor per day  . Drug use: No      Allergies   Penicillins   Review of Systems Review of Systems  Constitutional: Positive for chills. Negative for fever.  HENT: Negative for facial swelling and sore throat.   Respiratory: Negative for shortness of breath.   Cardiovascular: Negative for chest pain.  Gastrointestinal: Positive for abdominal pain, diarrhea, nausea and vomiting. Negative for blood in stool.  Genitourinary: Negative for dysuria and frequency.  Musculoskeletal: Positive for back pain.  Skin: Negative for rash and wound.  Neurological: Negative for headaches.  Psychiatric/Behavioral: The patient is not nervous/anxious.      Physical Exam Updated Vital Signs BP (!) 139/110   Pulse 77   Temp 98.5 F (36.9 C) (Oral)   Resp (!) 22   Ht 6\' 2"  (1.88 m)   Wt 104.3 kg   SpO2 95%   BMI 29.53 kg/m   Physical Exam  Constitutional: He appears well-developed and well-nourished. No distress.  HENT:  Head: Normocephalic and atraumatic.  Mouth/Throat: Oropharynx is clear and moist. No oropharyngeal exudate.  Eyes: Pupils are equal, round, and reactive to light. Conjunctivae are normal. Right eye exhibits no discharge. Left eye exhibits no discharge. No scleral icterus.  Neck: Normal range of motion. Neck supple. No thyromegaly present.  Cardiovascular: Normal rate, regular rhythm, normal heart sounds and intact distal pulses. Exam reveals no gallop and no friction rub.  No murmur heard. Pulmonary/Chest: Effort normal and breath sounds normal. No stridor. No respiratory distress. He has no wheezes. He has no rales.  Abdominal: Soft. Bowel sounds are normal. He exhibits no distension. There is tenderness in the left lower quadrant. There is no rebound, no guarding and no CVA tenderness.  Musculoskeletal: He exhibits no edema.  Lymphadenopathy:    He has no cervical adenopathy.  Neurological: He is alert. Coordination normal.  Skin: Skin is warm. No rash noted. He is diaphoretic (clammy). No pallor.   Psychiatric: He has a normal mood and affect.  Nursing note and vitals reviewed.    ED Treatments / Results  Labs (all labs ordered are listed, but only abnormal results are displayed) Labs Reviewed  COMPREHENSIVE METABOLIC PANEL - Abnormal; Notable for the following components:      Result Value   Potassium 3.3 (*)    Glucose, Bld 122 (*)    Creatinine, Ser 1.64 (*)    Total Protein 9.0 (*)    AST 83 (*)    ALT 90 (*)    Total Bilirubin 1.5 (*)    GFR calc non Af Amer 50 (*)    GFR calc Af Amer 59 (*)    Anion gap 16 (*)    All other components within normal limits  CBC - Abnormal; Notable for the following components:   WBC 14.0 (*)    Hemoglobin 18.0 (*)    HCT 54.1 (*)    All other components within normal limits  URINALYSIS, ROUTINE W REFLEX MICROSCOPIC - Abnormal; Notable for the following components:   APPearance HAZY (*)    Ketones, ur 5 (*)    Protein, ur 100 (*)    Leukocytes, UA TRACE (*)    All other components within normal limits  LIPASE, BLOOD    EKG None  Radiology Ct Abdomen Pelvis W Contrast  Result Date: 01/05/2018 CLINICAL DATA:  Lower abdominal pain with fever and diarrhea. EXAM: CT ABDOMEN AND PELVIS WITH CONTRAST TECHNIQUE: Multidetector CT imaging of the abdomen and pelvis was performed using the standard protocol following bolus administration of intravenous contrast. CONTRAST:  ISOVUE-300 IOPAMIDOL (ISOVUE-300) INJECTION 61% COMPARISON:  None. FINDINGS: Lower chest: Small type 1 hiatal hernia. Hepatobiliary: Diffuse hepatic steatosis with some sparing along the gallbladder fossa. 1.2 by 1.1 cm focus of accentuated density in segment 4 of the liver is probably from focal fatty sparing but technically nonspecific. Gallbladder unremarkable. Pancreas: Unremarkable Spleen: Unremarkable Adrenals/Urinary Tract: Unremarkable Stomach/Bowel: Abnormal appendiceal thickening up to 1.2 cm with mild surrounding periappendiceal stranding, compatible with  acute nonruptured appendicitis. Vascular/Lymphatic: Mild aortoiliac atherosclerotic vascular disease. Reproductive: Unremarkable Other: No supplemental non-categorized findings. Musculoskeletal: Mild fragmented spurring from both acetabula, chronic. Mild lower lumbar spondylosis and degenerative disc disease. IMPRESSION: 1. Acute nonruptured appendicitis. 2. Diffuse hepatic steatosis. 3. Small type 1 hiatal hernia. 4. 1.2 cm focus of accentuated density in segment 4 of the liver is probably from focal fatty sparing but technically nonspecific. 5.  Aortic Atherosclerosis (ICD10-I70.0). Electronically Signed   By: Gaylyn Rong M.D.   On: 01/05/2018 07:38    Procedures Procedures (including critical care time)  Medications Ordered in ED Medications  LORazepam (ATIVAN) injection 0.5 mg ( Intravenous MAR Hold 01/05/18 0959)  lactated ringers infusion ( Intravenous Continued from Pre-op 01/05/18 1037)  morphine 4 MG/ML injection 4 mg (4 mg Intravenous Given 01/05/18 0613)  sodium chloride 0.9 % bolus 1,000 mL (0 mLs Intravenous Stopped 01/05/18 0735)  ondansetron (ZOFRAN) injection  4 mg (4 mg Intravenous Given 01/05/18 0613)  iopamidol (ISOVUE-300) 61 % injection (100 mLs  Contrast Given 01/05/18 0655)  HYDROmorphone (DILAUDID) injection 0.5 mg (0.5 mg Intravenous Given 01/05/18 0745)  cefTRIAXone (ROCEPHIN) 2 g in sodium chloride 0.9 % 100 mL IVPB (0 g Intravenous Stopped 01/05/18 0931)    And  metroNIDAZOLE (FLAGYL) IVPB 500 mg (500 mg Intravenous Given 01/05/18 1059)  HYDROmorphone (DILAUDID) injection 1 mg (1 mg Intravenous Given 01/05/18 0838)  metoprolol tartrate (LOPRESSOR) injection 5 mg (5 mg Intravenous Given 01/05/18 0943)  gabapentin (NEURONTIN) capsule 300 mg (300 mg Oral Given 01/05/18 1020)  acetaminophen (TYLENOL) tablet 1,000 mg (1,000 mg Oral Given 01/05/18 1020)     Initial Impression / Assessment and Plan / ED Course  I have reviewed the triage vital signs and the nursing notes.  Pertinent labs  & imaging results that were available during my care of the patient were reviewed by me and considered in my medical decision making (see chart for details).     Patient with acute appendicitis, nonruptured.  CBC shows WBC 14.0, hemoglobin 18.0.  CMP shows AST 83, ALT 90, anion gap 16.  Patient does drink alcohol, last drink yesterday.  CT abdomen pelvis also showed diffuse fatty liver, and a nonspecific liver lesion, and small hiatal hernia.  Patient made aware of these findings.  Patient case discussed with general surgery who will take the patient to the OR.  Rocephin and Flagyl initiated in the ED.  Pain controlled with Dilaudid and nausea vomiting controlled with Zofran.  Fluids also given.  Patient and significant other understand and agree with plan.  Patient vitals stable throughout ED course.   Final Clinical Impressions(s) / ED Diagnoses   Final diagnoses:  Other acute appendicitis    ED Discharge Orders    None       Emi Holes, Cordelia Poche 01/05/18 1109    Azalia Bilis, MD 01/06/18 [phone]

## 2018-01-05 NOTE — Transfer of Care (Signed)
Immediate Anesthesia Transfer of Care Note  Patient: Derrick Hoffman  Procedure(s) Performed: APPENDECTOMY LAPAROSCOPIC (N/A Abdomen)  Patient Location: PACU  Anesthesia Type:General  Level of Consciousness: awake, alert , oriented and patient cooperative  Airway & Oxygen Therapy: Patient Spontanous Breathing and Patient connected to face mask oxygen  Post-op Assessment: Report given to RN, Post -op Vital signs reviewed and stable and Patient moving all extremities X 4  Post vital signs: Reviewed and stable  Last Vitals:  Vitals Value Taken Time  BP 152/114 01/05/2018 11:52 AM  Temp    Pulse 97 01/05/2018 11:55 AM  Resp 23 01/05/2018 11:55 AM  SpO2 99 % 01/05/2018 11:55 AM  Vitals shown include unvalidated device data.  Last Pain:  Vitals:   01/05/18 0829  TempSrc:   PainSc: 8          Complications: No apparent anesthesia complications

## 2018-01-05 NOTE — Anesthesia Postprocedure Evaluation (Signed)
Anesthesia Post Note  Patient: Derrick Hoffman  Procedure(s) Performed: APPENDECTOMY LAPAROSCOPIC (N/A Abdomen)     Patient location during evaluation: PACU Anesthesia Type: General Level of consciousness: awake and alert Pain management: pain level controlled Vital Signs Assessment: post-procedure vital signs reviewed and stable Respiratory status: spontaneous breathing, nonlabored ventilation and respiratory function stable Cardiovascular status: blood pressure returned to baseline and stable Postop Assessment: no apparent nausea or vomiting Anesthetic complications: no    Last Vitals:  Vitals:   01/05/18 1352 01/05/18 1356  BP: (!) 143/92 (!) 159/97  Pulse: 78 87  Resp: (!) 38 (!) 29  Temp:    SpO2: 99% 97%    Last Pain:  Vitals:   01/05/18 1400  TempSrc:   PainSc: Asleep                 Zeev Deakins,W. EDMOND

## 2018-01-05 NOTE — H&P (Signed)
[facility] Surgery Consult/Admission Note  Derrick Hoffman 12/12/1975  [id number].    Requesting MD: Dr. Venora Maples Chief Complaint/Reason for Consult: appendicitis  HPI:   Pt is a 42 yo male with uncontrolled HTN (medical noncompliance) who presented to the ED with complaints of abdominal pain. Pt states he woke Sunday morning around 0200 with pain and vomiting. Pain is in his periumbilical region, non radiating, severe at times. He was seen at his PCP yesterday and they said it was acid reflux. Emesis Sunday subsided but returned last night. Associated chills and diaphoresis. He has not been eating much. No anticoagulation, no hx of abdominal surgeries. Has not taken his BP med in a month or more. Family at bedside.   ROS:  Review of Systems  Constitutional: Positive for chills and diaphoresis. Negative for fever.  HENT: Negative for sore throat.   Respiratory: Negative for cough and shortness of breath.   Cardiovascular: Negative for chest pain.  Gastrointestinal: Positive for abdominal pain, nausea and vomiting. Negative for blood in stool, constipation and diarrhea.  Genitourinary: Negative for dysuria, hematuria and urgency.  Skin: Negative for rash.  Neurological: Negative for dizziness, loss of consciousness and weakness.  All other systems reviewed and are negative.    No family history on file.  Past Medical History:  Diagnosis Date  . Hypertension     No past surgical history on file.  Social History:  reports that he has been smoking. He has never used smokeless tobacco. He reports that he drinks alcohol. He reports that he does not use drugs.  Allergies:  Allergies  Allergen Reactions  . Penicillins Swelling     (Not in a hospital admission)  Blood pressure (!) 182/116, pulse 64, temperature 98.5 F (36.9 C), temperature source Oral, resp. rate (!) 22, height 6' 2"  (1.88 m), weight 104.3 kg, SpO2 100 %.  Physical Exam  Constitutional: He is oriented to  person, place, and time. He appears well-developed and well-nourished.  Non-toxic appearance. He does not appear ill. No distress.  HENT:  Head: Normocephalic and atraumatic.  Nose: Nose normal.  Mouth/Throat: Oropharynx is clear and moist and mucous membranes are normal. No oropharyngeal exudate.  Eyes: Pupils are equal, round, and reactive to light. Conjunctivae are normal. Right eye exhibits no discharge. Left eye exhibits no discharge. No scleral icterus.  Neck: Normal range of motion. Neck supple. No thyromegaly present.  Cardiovascular: Normal rate, regular rhythm, normal heart sounds and intact distal pulses.  No murmur heard. Pulses:      Radial pulses are 2+ on the right side, and 2+ on the left side.       Posterior tibial pulses are 2+ on the right side, and 2+ on the left side.  Pulmonary/Chest: Effort normal and breath sounds normal. No respiratory distress. He has no wheezes. He has no rhonchi. He has no rales.  Abdominal: Soft. Normal appearance and bowel sounds are normal. He exhibits no distension. There is no hepatosplenomegaly. There is tenderness in the periumbilical area. There is guarding. There is no rigidity.    Musculoskeletal: Normal range of motion. He exhibits no edema, tenderness or deformity.  Lymphadenopathy:    He has no cervical adenopathy.  Neurological: He is alert and oriented to person, place, and time. He has normal strength. No sensory deficit.  Skin: Skin is warm and dry. No rash noted. He is not diaphoretic.  Psychiatric: He has a normal mood and affect.  Nursing note and vitals reviewed.  Results for orders placed or performed during the hospital encounter of 01/05/18 (from the past 48 hour(s))  Urinalysis, Routine w reflex microscopic     Status: Abnormal   Collection Time: 01/05/18  5:10 AM  Result Value Ref Range   Color, Urine YELLOW YELLOW   APPearance HAZY (A) CLEAR   Specific Gravity, Urine 1.028 1.005 - 1.030   pH 5.0 5.0 - 8.0    Glucose, UA NEGATIVE NEGATIVE mg/dL   Hgb urine dipstick NEGATIVE NEGATIVE   Bilirubin Urine NEGATIVE NEGATIVE   Ketones, ur 5 (A) NEGATIVE mg/dL   Protein, ur 100 (A) NEGATIVE mg/dL   Nitrite NEGATIVE NEGATIVE   Leukocytes, UA TRACE (A) NEGATIVE   RBC / HPF 0-5 0 - 5 RBC/hpf   WBC, UA 0-5 0 - 5 WBC/hpf   Bacteria, UA NONE SEEN NONE SEEN   Squamous Epithelial / LPF 0-5 0 - 5   Mucus PRESENT    Hyaline Casts, UA PRESENT     Comment: Performed at [facility], [address] [place], [place] [address]  Lipase, blood     Status: None   Collection Time: 01/05/18  5:11 AM  Result Value Ref Range   Lipase 45 11 - 51 U/L    Comment: Performed at [facility] [address] [place], [place] [address]  Comprehensive metabolic panel     Status: Abnormal   Collection Time: 01/05/18  5:11 AM  Result Value Ref Range   Sodium 140 135 - 145 mmol/L   Potassium 3.3 (L) 3.5 - 5.1 mmol/L   Chloride 100 98 - 111 mmol/L   CO2 24 22 - 32 mmol/L   Glucose, Bld 122 (H) 70 - 99 mg/dL   BUN 19 6 - 20 mg/dL   Creatinine, Ser 1.64 (H) 0.61 - 1.24 mg/dL   Calcium 10.0 8.9 - 10.3 mg/dL   Total Protein 9.0 (H) 6.5 - 8.1 g/dL   Albumin 5.0 3.5 - 5.0 g/dL   AST 83 (H) 15 - 41 U/L   ALT 90 (H) 0 - 44 U/L   Alkaline Phosphatase 78 38 - 126 U/L   Total Bilirubin 1.5 (H) 0.3 - 1.2 mg/dL   GFR calc non Af Amer 50 (L) >60 mL/min   GFR calc Af Amer 59 (L) >60 mL/min    Comment: (NOTE) The eGFR has been calculated using the CKD EPI equation. This calculation has not been validated in all clinical situations. eGFR's persistently <60 mL/min signify possible Chronic Kidney Disease.    Anion gap 16 (H) 5 - 15    Comment: Performed at [facility], [place] [address] [place], [place] [address]  CBC     Status: Abnormal   Collection Time: 01/05/18  5:11 AM  Result Value Ref Range   WBC 14.0 (H) 4.0 - 10.5 K/uL   RBC 5.52 4.22 - 5.81 MIL/uL   Hemoglobin 18.0 (H) 13.0 - 17.0 g/dL   HCT 54.1 (H)  39.0 - 52.0 %   MCV 98.0 78.0 - 100.0 fL   MCH 32.6 26.0 - 34.0 pg   MCHC 33.3 30.0 - 36.0 g/dL   RDW 14.3 11.5 - 15.5 %   Platelets 284 150 - 400 K/uL    Comment: Performed at [facility] [address] [place], [place] [address]   Ct Abdomen Pelvis W Contrast  Result Date: 01/05/2018 CLINICAL DATA:  Lower abdominal pain with fever and diarrhea. EXAM: CT ABDOMEN AND  PELVIS WITH CONTRAST TECHNIQUE: Multidetector CT imaging of the abdomen and pelvis was performed using the standard protocol following bolus administration of intravenous contrast. CONTRAST:  193m ISOVUE-300 IOPAMIDOL (ISOVUE-300) INJECTION 61% COMPARISON:  None. FINDINGS: Lower chest: Small type 1 hiatal hernia. Hepatobiliary: Diffuse hepatic steatosis with some sparing along the gallbladder fossa. 1.2 by 1.1 cm focus of accentuated density in segment 4 of the liver is probably from focal fatty sparing but technically nonspecific. Gallbladder unremarkable. Pancreas: Unremarkable Spleen: Unremarkable Adrenals/Urinary Tract: Unremarkable Stomach/Bowel: Abnormal appendiceal thickening up to 1.2 cm with mild surrounding periappendiceal stranding, compatible with acute nonruptured appendicitis. Vascular/Lymphatic: Mild aortoiliac atherosclerotic vascular disease. Reproductive: Unremarkable Other: No supplemental non-categorized findings. Musculoskeletal: Mild fragmented spurring from both acetabula, chronic. Mild lower lumbar spondylosis and degenerative disc disease. IMPRESSION: 1. Acute nonruptured appendicitis. 2. Diffuse hepatic steatosis. 3. Small type 1 hiatal hernia. 4. 1.2 cm focus of accentuated density in segment 4 of the liver is probably from focal fatty sparing but technically nonspecific. 5.  Aortic Atherosclerosis (ICD10-I70.0). Electronically Signed   By: WVan ClinesM.D.   On: 01/05/2018 07:38      Assessment/Plan Active Problems:   * No active hospital problems. *   HTN - IV lopressor, resume home meds  post-op  Appendicitis  - OR today for lap appy  FEN: NPO VTE: SCD's, no lovenox until post op ID: Rocephin & Flagyl  Foley: none  Follow up: TBD  Plan[facility] today [facility] lap appy[facility] 01/05[facility]2018, 9:18 AM Pager: [phone]: [phone]-Fri 7:00 am-4:30 pm Sat-Sun 7:00 am-11:30 am

## 2018-01-06 ENCOUNTER — Encounter (HOSPITAL_COMMUNITY): Payer: Self-pay | Admitting: General Surgery

## 2018-01-06 LAB — HIV ANTIBODY (ROUTINE TESTING W REFLEX): HIV Screen 4th Generation wRfx: NONREACTIVE

## 2018-01-06 MED ORDER — ZOLPIDEM TARTRATE 5 MG PO TABS
10.0000 mg | ORAL_TABLET | Freq: Every evening | ORAL | Status: DC | PRN
  Administered 2018-01-06: 10 mg via ORAL
  Filled 2018-01-06: qty 2

## 2018-01-06 MED ORDER — OXYCODONE HCL 5 MG PO TABS: 5.0000 mg | ORAL_TABLET | Freq: Four times a day (QID) | ORAL | 0 refills | Status: DC | PRN

## 2018-01-06 MED ORDER — ACETAMINOPHEN 500 MG PO TABS: 1000.0000 mg | ORAL_TABLET | Freq: Four times a day (QID) | ORAL | 0 refills | Status: DC | PRN

## 2018-01-06 MED ORDER — IBUPROFEN 800 MG PO TABS: 800.0000 mg | ORAL_TABLET | Freq: Three times a day (TID) | ORAL | 0 refills | Status: DC | PRN

## 2018-01-06 NOTE — Progress Notes (Signed)
Pt discharged to home Pt left with wife Pt indicated all discharge instructions given to him by PA

## 2018-01-06 NOTE — Discharge Summary (Signed)
[facility] Discharge Summary   Patient ID: Derrick Hoffman MRN: [id number] DOB/AGE: 1976-02-16 42 y.o.  Admit date: 01/05/2018 Discharge date: 01/06/2018  Admitting Diagnosis: Acute appendicitis  Discharge Diagnosis Patient Active Problem List   Diagnosis Date Noted  . Appendicitis 01/05/2018    Consultants None  Imaging: Ct Abdomen Pelvis W Contrast  Result Date: 01/05/2018 CLINICAL DATA:  Lower abdominal pain with fever and diarrhea. EXAM: CT ABDOMEN AND PELVIS WITH CONTRAST TECHNIQUE: Multidetector CT imaging of the abdomen and pelvis was performed using the standard protocol following bolus administration of intravenous contrast. CONTRAST:  ISOVUE-300 IOPAMIDOL (ISOVUE-300) INJECTION 61% COMPARISON:  None. FINDINGS: Lower chest: Small type 1 hiatal hernia. Hepatobiliary: Diffuse hepatic steatosis with some sparing along the gallbladder fossa. 1.2 by 1.1 cm focus of accentuated density in segment 4 of the liver is probably from focal fatty sparing but technically nonspecific. Gallbladder unremarkable. Pancreas: Unremarkable Spleen: Unremarkable Adrenals/Urinary Tract: Unremarkable Stomach/Bowel: Abnormal appendiceal thickening up to 1.2 cm with mild surrounding periappendiceal stranding, compatible with acute nonruptured appendicitis. Vascular/Lymphatic: Mild aortoiliac atherosclerotic vascular disease. Reproductive: Unremarkable Other: No supplemental non-categorized findings. Musculoskeletal: Mild fragmented spurring from both acetabula, chronic. Mild lower lumbar spondylosis and degenerative disc disease. IMPRESSION: 1. Acute nonruptured appendicitis. 2. Diffuse hepatic steatosis. 3. Small type 1 hiatal hernia. 4. 1.2 cm focus of accentuated density in segment 4 of the liver is probably from focal fatty sparing but technically nonspecific. 5.  Aortic Atherosclerosis (ICD10-I70.0). Electronically Signed   By: Gaylyn Rong M.D.   On: 01/05/2018 07:38     Procedures Dr. Carolynne Edouard (01/05/18) - Laparoscopic Appendectomy  Hospital Course:  Patient is a 42 year old male who presented to [facility] with abdominal pain.  Workup showed acute appendicitis.  Patient was admitted and underwent procedure listed above.  Tolerated procedure well and was transferred to the floor.  Diet was advanced as tolerated.  On POD#1, the patient was voiding well, tolerating diet, ambulating well, pain well controlled, vital signs stable, incisions c/d/i and felt stable for discharge home.  Patient will follow up in our office in 2 weeks and knows to call with questions or concerns.  He will call to confirm appointment date/time.    Physical Exam: General:  Alert, NAD, pleasant, comfortable Abd:  Soft, ND, mild tenderness, incisions C/D/I   Allergies as of 01/06/2018      Reactions   Penicillins Swelling      Medication List    TAKE these medications   acetaminophen 500 MG tablet Commonly known as:  TYLENOL Take 2 tablets (1,000 mg total) by mouth every 6 (six) hours as needed for mild pain.   amLODipine 5 MG tablet Commonly known as:  NORVASC Take 1 tablet (5 mg total) by mouth daily.   hydrOXYzine 50 MG tablet Commonly known as:  ATARAX/VISTARIL Take 50 mg by mouth 3 (three) times daily as needed for anxiety.   ibuprofen 800 MG tablet Commonly known as:  ADVIL,MOTRIN Take 1 tablet (800 mg total) by mouth every 8 (eight) hours as needed for mild pain.   omeprazole 40 MG capsule Commonly known as:  PRILOSEC Take 40 mg by mouth daily.   oxyCODONE 5 MG immediate release tablet Commonly known as:  Oxy IR/ROXICODONE Take 1-2 tablets (5-10 mg total) by mouth every 6 (six) hours as needed for moderate pain or severe pain (5mg  for moderate pain, 10mg  for severe pain).   sertraline 50 MG tablet Commonly known as:  ZOLOFT Take 50 mg by mouth  daily.        Follow-up Information    [facility], [facility]. Go on 01/18/2018.   Specialty:  General Surgery Why:   Follow up appointment scheduled for 11:00 AM. Please arrive 30 min prior to appointment time. Bring photo ID and insurance information.  Contact information: [address] [place] [address] [phone]        Norval Gable, DO. Call.   Specialty:  Family Medicine Why:  Follow up for high blood pressure as scheduled Contact information: [address] [place] [address] [phone]           Signed: Wells Guiles, [facility] 01/06/2018, 8:54 AM Pager: [phone] Consults: [phone] Mon-Fri 7:00 am-4:30 pm Sat-Sun 7:00 am-11:30 am

## 2018-01-08 ENCOUNTER — Telehealth: Payer: Self-pay | Admitting: Surgery

## 2018-01-08 NOTE — Telephone Encounter (Signed)
Derrick Hoffman  1976-04-30 [id number]  Patient Care Team: Norval Gable, DO as PCP - General (Family Medicine)  This patient is a 42 y.o.male who calls today for surgical evaluation.   POST-OPERATIVE DIAGNOSIS:  appendicitis  PROCEDURE:  Procedure(s): APPENDECTOMY LAPAROSCOPIC (N/A)  SURGEON:  Surgeon(s) and Role:    Griselda Miner, MD - Primary  Reason for call: Pain  Called by patient's mouth.  Patient status post urgent appendectomy a few days ago.  Was discharged 2 days ago.  Is on his last oxycodone pill.  He is tolerating p.o.  Moving his bowels.  Trying to use ibuprofen.  I recommend he increase ibuprofen 800 mg 4 times a day.  Add ice/heat.  I cannot call narcotics but they can reach out to the office Monday morning.  I did call in Robaxin.  500 mg.  1-2 p.o. every 6 hours as needed muscle spasm.  [facility] on [address] per the patient's wife's request.  Hopefully that will help amount.  She expressed appreciation.  Patient Active Problem List   Diagnosis Date Noted  . Appendicitis 01/05/2018    Past Medical History:  Diagnosis Date  . Anxiety   . Childhood asthma   . History of kidney stones 1990  . Hypertension     Past Surgical History:  Procedure Laterality Date  . ANTERIOR CRUCIATE LIGAMENT REPAIR Right 1996  . APPENDECTOMY  01/05/2018  . BUNIONECTOMY Left 2013  . LAPAROSCOPIC APPENDECTOMY N/A 01/05/2018   Procedure: APPENDECTOMY LAPAROSCOPIC;  Surgeon: Griselda Miner, MD;  Location: [facility] OR;  Service: General;  Laterality: N/A;    Social History   Socioeconomic History  . Marital status: Married    Spouse name: Not on file  . Number of children: Not on file  . Years of education: Not on file  . Highest education level: Not on file  Occupational History  . Not on file  Social Needs  . Financial resource strain: Not on file  . Food insecurity:    Worry: Not on file    Inability: Not on file  . Transportation needs:    Medical: Not on  file    Non-medical: Not on file  Tobacco Use  . Smoking status: Current Every Day Smoker    Packs/day: 0.50    Years: 1.00    Pack years: 0.50  . Smokeless tobacco: Never Used  Substance and Sexual Activity  . Alcohol use: Yes    Alcohol/week: 48.0 standard drinks    Types: 48 Shots of liquor per week    Frequency: Never    Comment: 01/05/2018 " 5th of liquor/day;  3 days/wk  . Drug use: Not Currently  . Sexual activity: Not Currently  Lifestyle  . Physical activity:    Days per week: Not on file    Minutes per session: Not on file  . Stress: Not on file  Relationships  . Social connections:    Talks on phone: Not on file    Gets together: Not on file    Attends religious service: Not on file    Active member of club or organization: Not on file    Attends meetings of clubs or organizations: Not on file    Relationship status: Not on file  . Intimate partner violence:    Fear of current or ex partner: Not on file    Emotionally abused: Not on file    Physically abused: Not on file    Forced sexual activity: Not  on file  Other Topics Concern  . Not on file  Social History Narrative  . Not on file    No family history on file.  Current Outpatient Medications  Medication Sig Dispense Refill  . acetaminophen (TYLENOL) 500 MG tablet Take 2 tablets (1,000 mg total) by mouth every 6 (six) hours as needed for mild pain.  0  . amLODipine (NORVASC) 5 MG tablet Take 1 tablet (5 mg total) by mouth daily. 30 tablet 0  . hydrOXYzine (ATARAX/VISTARIL) 50 MG tablet Take 50 mg by mouth 3 (three) times daily as needed for anxiety.    Marland Kitchen ibuprofen (ADVIL,MOTRIN) 800 MG tablet Take 1 tablet (800 mg total) by mouth every 8 (eight) hours as needed for mild pain. 30 tablet 0  . omeprazole (PRILOSEC) 40 MG capsule Take 40 mg by mouth daily.    Marland Kitchen oxyCODONE (OXY IR/ROXICODONE) 5 MG immediate release tablet Take 1-2 tablets (5-10 mg total) by mouth every 6 (six) hours as needed for moderate pain  or severe pain (5mg  for moderate pain, 10mg  for severe pain). 15 tablet 0  . sertraline (ZOLOFT) 50 MG tablet Take 50 mg by mouth daily.     No current facility-administered medications for this visit.      Allergies  Allergen Reactions  . Penicillins Swelling    @VS @  Ct Abdomen Pelvis W Contrast  Result Date: 01/05/2018 CLINICAL DATA:  Lower abdominal pain with fever and diarrhea. EXAM: CT ABDOMEN AND PELVIS WITH CONTRAST TECHNIQUE: Multidetector CT imaging of the abdomen and pelvis was performed using the standard protocol following bolus administration of intravenous contrast. CONTRAST:  ISOVUE-300 IOPAMIDOL (ISOVUE-300) INJECTION 61% COMPARISON:  None. FINDINGS: Lower chest: Small type 1 hiatal hernia. Hepatobiliary: Diffuse hepatic steatosis with some sparing along the gallbladder fossa. 1.2 by 1.1 cm focus of accentuated density in segment 4 of the liver is probably from focal fatty sparing but technically nonspecific. Gallbladder unremarkable. Pancreas: Unremarkable Spleen: Unremarkable Adrenals/Urinary Tract: Unremarkable Stomach/Bowel: Abnormal appendiceal thickening up to 1.2 cm with mild surrounding periappendiceal stranding, compatible with acute nonruptured appendicitis. Vascular/Lymphatic: Mild aortoiliac atherosclerotic vascular disease. Reproductive: Unremarkable Other: No supplemental non-categorized findings. Musculoskeletal: Mild fragmented spurring from both acetabula, chronic. Mild lower lumbar spondylosis and degenerative disc disease. IMPRESSION: 1. Acute nonruptured appendicitis. 2. Diffuse hepatic steatosis. 3. Small type 1 hiatal hernia. 4. 1.2 cm focus of accentuated density in segment 4 of the liver is probably from focal fatty sparing but technically nonspecific. 5.  Aortic Atherosclerosis (ICD10-I70.0). Electronically Signed   By: Gaylyn Rong M.D.   On: 01/05/2018 07:38    Note: This dictation was prepared with Dragon/digital dictation along with  [employer]. Any transcriptional errors that result from this process are unintentional.   .Ardeth Sportsman, M.D., F.A.C.S. Gastrointestinal and Minimally Invasive Surgery [facility] [address] [place], [place] [address] [phone] Main / Paging  01/08/2018 3:22 PM

## 2018-06-25 ENCOUNTER — Encounter (HOSPITAL_COMMUNITY): Payer: Self-pay | Admitting: Emergency Medicine

## 2018-06-25 ENCOUNTER — Emergency Department (HOSPITAL_COMMUNITY)
Admission: EM | Admit: 2018-06-25 | Discharge: 2018-06-25 | Disposition: A | Discharge status: Home / Self Care | Payer: 59 | Attending: Emergency Medicine | Admitting: Emergency Medicine

## 2018-06-25 ENCOUNTER — Other Ambulatory Visit: Payer: Self-pay

## 2018-06-25 DIAGNOSIS — R1013 Epigastric pain: Secondary | ICD-10-CM

## 2018-06-25 DIAGNOSIS — R110 Nausea: Secondary | ICD-10-CM

## 2018-06-25 DIAGNOSIS — F17210 Nicotine dependence, cigarettes, uncomplicated: Secondary | ICD-10-CM | POA: Insufficient documentation

## 2018-06-25 DIAGNOSIS — R197 Diarrhea, unspecified: Secondary | ICD-10-CM | POA: Insufficient documentation

## 2018-06-25 DIAGNOSIS — R112 Nausea with vomiting, unspecified: Secondary | ICD-10-CM | POA: Insufficient documentation

## 2018-06-25 DIAGNOSIS — I10 Essential (primary) hypertension: Secondary | ICD-10-CM | POA: Insufficient documentation

## 2018-06-25 LAB — COMPREHENSIVE METABOLIC PANEL
ALT: 188 U/L — ABNORMAL HIGH (ref 0–44)
ANION GAP: 13 (ref 5–15)
AST: 140 U/L — ABNORMAL HIGH (ref 15–41)
Albumin: 4.6 g/dL (ref 3.5–5.0)
Alkaline Phosphatase: 69 U/L (ref 38–126)
BILIRUBIN TOTAL: 0.9 mg/dL (ref 0.3–1.2)
BUN: 10 mg/dL (ref 6–20)
CALCIUM: 9.5 mg/dL (ref 8.9–10.3)
CO2: 20 mmol/L — ABNORMAL LOW (ref 22–32)
Chloride: 106 mmol/L (ref 98–111)
Creatinine, Ser: 1.09 mg/dL (ref 0.61–1.24)
GFR calc Af Amer: >60 mL/min (ref >60)
GLUCOSE: 82 mg/dL (ref 70–99)
POTASSIUM: 3.5 mmol/L (ref 3.5–5.1)
Sodium: 139 mmol/L (ref 135–145)
TOTAL PROTEIN: 7.9 g/dL (ref 6.5–8.1)

## 2018-06-25 LAB — CBC WITH DIFFERENTIAL/PLATELET
ABS IMMATURE GRANULOCYTES: 0.03 10*3/uL (ref 0.00–0.07)
BASOS PCT: 0 %
Basophils Absolute: 0 10*3/uL (ref 0.0–0.1)
EOS ABS: 0.1 10*3/uL (ref 0.0–0.5)
Eosinophils Relative: 1 %
HCT: 46.2 % (ref 39.0–52.0)
Hemoglobin: 15 g/dL (ref 13.0–17.0)
Immature Granulocytes: 0 %
Lymphocytes Relative: 25 %
Lymphs Abs: 2.5 10*3/uL (ref 0.7–4.0)
MCH: 30.6 pg (ref 26.0–34.0)
MCHC: 32.5 g/dL (ref 30.0–36.0)
MCV: 94.3 fL (ref 80.0–100.0)
MONO ABS: 0.8 10*3/uL (ref 0.1–1.0)
MONOS PCT: 8 %
NEUTROS ABS: 6.6 10*3/uL (ref 1.7–7.7)
NEUTROS PCT: 66 %
PLATELETS: 220 10*3/uL (ref 150–400)
RBC: 4.9 MIL/uL (ref 4.22–5.81)
RDW: 13.3 % (ref 11.5–15.5)
WBC: 10.1 10*3/uL (ref 4.0–10.5)
nRBC: 0 % (ref 0.0–0.2)

## 2018-06-25 LAB — LIPASE, BLOOD: LIPASE: 52 U/L — AB (ref 11–51)

## 2018-06-25 MED ORDER — SUCRALFATE 1 G PO TABS
1.0000 g | ORAL_TABLET | Freq: Once | ORAL | Status: AC
  Administered 2018-06-25: 1 g via ORAL
  Filled 2018-06-25: qty 1

## 2018-06-25 MED ORDER — PANTOPRAZOLE SODIUM 40 MG IV SOLR
40.0000 mg | Freq: Once | INTRAVENOUS | Status: AC
  Administered 2018-06-25: 40 mg via INTRAVENOUS
  Filled 2018-06-25: qty 40

## 2018-06-25 MED ORDER — OMEPRAZOLE 40 MG PO CPDR: 40.0000 mg | DELAYED_RELEASE_CAPSULE | Freq: Every day | ORAL | 0 refills | Status: DC

## 2018-06-25 MED ORDER — FAMOTIDINE IN NACL 20-0.9 MG/50ML-% IV SOLN
20.0000 mg | Freq: Once | INTRAVENOUS | Status: AC
  Administered 2018-06-25: 20 mg via INTRAVENOUS
  Filled 2018-06-25: qty 50

## 2018-06-25 MED ORDER — SUCRALFATE 1 G PO TABS: 1.0000 g | ORAL_TABLET | Freq: Three times a day (TID) | ORAL | 0 refills | Status: DC

## 2018-06-25 MED ORDER — ONDANSETRON HCL 4 MG/2ML IJ SOLN
4.0000 mg | Freq: Once | INTRAMUSCULAR | Status: AC
  Administered 2018-06-25: 4 mg via INTRAVENOUS
  Filled 2018-06-25: qty 2

## 2018-06-25 MED ORDER — SODIUM CHLORIDE 0.9 % IV BOLUS
1000.0000 mL | Freq: Once | INTRAVENOUS | Status: AC
  Administered 2018-06-25: 1000 mL via INTRAVENOUS

## 2018-06-25 NOTE — ED Triage Notes (Signed)
Pt reports upper abdominal pain over the past several months, but has become constant over the past week. Pt reports nausea and green vomitus within the past seven days.

## 2018-06-25 NOTE — ED Provider Notes (Addendum)
[facility] EMERGENCY DEPARTMENT Provider Note   CSN: [id number] Arrival date & time: 06/25/18  1747    History   Chief Complaint Chief Complaint  Patient presents with  . Abdominal Pain    HPI Derrick Hoffman is a 43 y.o. male.      Abdominal Pain  Pain location:  Epigastric Pain quality: fullness and pressure   Pain radiates to:  Does not radiate Pain severity:  Mild Duration:  8 weeks Timing:  Intermittent Progression:  Waxing and waning Chronicity:  Recurrent Context: previous surgery   Relieved by:  Nothing Worsened by:  Nothing Ineffective treatments:  None tried Associated symptoms: no chest pain, no chills, no constipation, no cough, no diarrhea, no dysuria, no fever, no hematemesis, no hematochezia, no hematuria, no shortness of breath, no sore throat and no vomiting   Risk factors: alcohol abuse     Past Medical History:  Diagnosis Date  . Anxiety   . Childhood asthma   . History of kidney stones 1990  . Hypertension     Patient Active Problem List   Diagnosis Date Noted  . Appendicitis 01/05/2018    Past Surgical History:  Procedure Laterality Date  . ANTERIOR CRUCIATE LIGAMENT REPAIR Right 1996  . APPENDECTOMY  01/05/2018  . BUNIONECTOMY Left 2013  . LAPAROSCOPIC APPENDECTOMY N/A 01/05/2018   Procedure: APPENDECTOMY LAPAROSCOPIC;  Surgeon: Griselda Miner, MD;  Location: [facility] OR;  Service: General;  Laterality: N/A;        Home Medications    Prior to Admission medications   Medication Sig Start Date End Date Taking? Authorizing Provider  amLODipine (NORVASC) 10 MG tablet Take 10 mg by mouth daily. 02/02/18  Yes [provider]  omeprazole (PRILOSEC) 40 MG capsule Take 40 mg by mouth daily.   Yes [provider]  sertraline (ZOLOFT) 50 MG tablet Take 50 mg by mouth daily.   Yes [provider]  Vitamin D, Ergocalciferol, (DRISDOL) 1.25 MG (50000 UT) CAPS capsule Take 50,000 Units by mouth every 7  (seven) days. 01/12/18  Yes [provider]  acetaminophen (TYLENOL) 500 MG tablet Take 2 tablets (1,000 mg total) by mouth every 6 (six) hours as needed for mild pain. Patient not taking: Reported on 06/25/2018 01/06/18   Rayburn, Tresa Endo A, PA-C  amLODipine (NORVASC) 5 MG tablet Take 1 tablet (5 mg total) by mouth daily. Patient not taking: Reported on 06/25/2018 07/20/17   Belinda Fisher, PA-C  ibuprofen (ADVIL,MOTRIN) 800 MG tablet Take 1 tablet (800 mg total) by mouth every 8 (eight) hours as needed for mild pain. Patient not taking: Reported on 06/25/2018 01/06/18   Rayburn, Alphonsus Sias, PA-C  omeprazole (PRILOSEC) 40 MG capsule Take 1 capsule (40 mg total) by mouth daily for 20 days. 06/25/18 07/15/18  Dahlia Client, MD  oxyCODONE (OXY IR/ROXICODONE) 5 MG immediate release tablet Take 1-2 tablets (5-10 mg total) by mouth every 6 (six) hours as needed for moderate pain or severe pain (5mg  for moderate pain, 10mg  for severe pain). Patient not taking: Reported on 06/25/2018 01/06/18   Rayburn, Tresa Endo A, PA-C  sucralfate (CARAFATE) 1 g tablet Take 1 tablet (1 g total) by mouth 4 (four) times daily -  with meals and at bedtime for 10 days. 06/25/18 07/05/18  Dahlia Client, MD    Family History No family history on file.  Social History Social History   Tobacco Use  . Smoking status: Current Every Day Smoker    Packs/day: 0.50  Years: 1.00    Pack years: 0.50  . Smokeless tobacco: Never Used  Substance Use Topics  . Alcohol use: Yes    Alcohol/week: 48.0 standard drinks    Types: 48 Shots of liquor per week    Frequency: Never    Comment: 01/05/2018 " 5th of liquor/day;  3 days/wk  . Drug use: Not Currently     Allergies   Penicillins   Review of Systems Review of Systems  Constitutional: Negative for chills and fever.  HENT: Negative for ear pain and sore throat.   Eyes: Negative for pain and visual disturbance.  Respiratory: Negative for cough and shortness of breath.     Cardiovascular: Negative for chest pain and palpitations.  Gastrointestinal: Positive for abdominal pain. Negative for constipation, diarrhea, hematemesis, hematochezia and vomiting.  Genitourinary: Negative for dysuria and hematuria.  Musculoskeletal: Negative for arthralgias and back pain.  Skin: Negative for color change and rash.  Neurological: Negative for seizures and syncope.  All other systems reviewed and are negative.    Physical Exam Updated Vital Signs BP (!) 155/107   Pulse 73   Temp 97.8 F (36.6 C) (Oral)   Resp 18   Ht 6\' 2"  (1.88 m)   Wt 104.3 kg   SpO2 97%   BMI 29.53 kg/m   Physical Exam Vitals signs and nursing note reviewed.  Constitutional:      Appearance: He is well-developed.     Comments: Patient resting comfortably, no acute distress.  HENT:     Head: Normocephalic and atraumatic.  Eyes:     Conjunctiva/sclera: Conjunctivae normal.  Neck:     Musculoskeletal: Neck supple.  Cardiovascular:     Rate and Rhythm: Normal rate and regular rhythm.     Heart sounds: No murmur.  Pulmonary:     Effort: Pulmonary effort is normal. No respiratory distress.     Breath sounds: Normal breath sounds.  Abdominal:     Palpations: Abdomen is soft.     Tenderness: There is abdominal tenderness in the epigastric area and left upper quadrant. There is guarding. There is no right CVA tenderness, left CVA tenderness or rebound. Negative signs include Murphy's sign and Rovsing's sign.     Hernia: No hernia is present.  Skin:    General: Skin is warm and dry.     Capillary Refill: Capillary refill takes less than 2 seconds.  Neurological:     General: No focal deficit present.     Mental Status: He is alert.  Psychiatric:        Mood and Affect: Mood normal.      ED Treatments / Results  Labs (all labs ordered are listed, but only abnormal results are displayed) Labs Reviewed  COMPREHENSIVE METABOLIC PANEL - Abnormal; Notable for the following  components:      Result Value   CO2 20 (*)    AST 140 (*)    ALT 188 (*)    All other components within normal limits  LIPASE, BLOOD - Abnormal; Notable for the following components:   Lipase 52 (*)    All other components within normal limits  CBC WITH DIFFERENTIAL/PLATELET  HEPATITIS PANEL, ACUTE    EKG None  Radiology No results found.  Procedures Procedures (including critical care time)  Medications Ordered in ED Medications  sodium chloride 0.9 % bolus 1,000 mL (0 mLs Intravenous Stopped 06/25/18 2009)  ondansetron (ZOFRAN) injection 4 mg (4 mg Intravenous Given 06/25/18 1903)  pantoprazole (PROTONIX) injection 40  mg (40 mg Intravenous Given 06/25/18 2131)  famotidine (PEPCID) IVPB 20 mg premix (0 mg Intravenous Stopped 06/25/18 2220)  sucralfate (CARAFATE) tablet 1 g (1 g Oral Given 06/25/18 2129)     Initial Impression / Assessment and Plan / ED Course  I have reviewed the triage vital signs and the nursing notes.  Pertinent labs & imaging results that were available during my care of the patient were reviewed by me and considered in my medical decision making (see chart for details).        43 year old male patient who presents with 2 months of intermittent vomiting worse in the morning, also associated with some coughing at times.  Vomiting is nonbloody, nonbilious.  Symptoms have been progressively getting worse which is led to the patient not being able to appropriately eat during the weekend which brought him to the emergency department today with family.  Patient denies any significant vomiting or trouble swallowing food after eating meals.  On further discussion with the patient he does use daily marijuana for quite some time possible cannabinoid hyperemesis syndrome based on the patient that he is less than 50, symptoms are worse in the morning, otherwise normal bowel habits.  In addition patient has alcohol use disorder with more than 2 shots of liquor or more  than 3 beers daily.  Possibly related to GI mucosa upset leading to patient's vomiting syndrome.  Physical exam positive for mild distention with epigastric tenderness on exam.  Will perform laboratory studies to assess liver function, pancreas, electrolytes at this time.  Will provide Zofran and appropriate fluid hydration.  Laboratory studies indicate transaminase elevation not 100% consistent with alcoholism.  Will order hepatitis panel which patient is going to follow-up with the result and a PCP visit.  Other laboratory studies reviewed, using decision making regarding management.  Likely gastritis related irritation from alcoholism as well as possible peptic ulcer disease.  Placed order for right upper quadrant ultrasound per Dr. Donnald Garre for patient to get as outpatient. Will have follow-up with gastroenterology, primary care provider.  Patient given Carafate, Protonix.  Patient with strict return precautions if pain worsens or changes in character.  The above care was discussed and agreed upon by my attending physician.  Final Clinical Impressions(s) / ED Diagnoses   Final diagnoses:  Nausea  Nausea vomiting and diarrhea  Epigastric pain    ED Discharge Orders         Ordered    Ambulatory referral to Gastroenterology     06/25/18 2221    sucralfate (CARAFATE) 1 g tablet  3 times daily with meals & bedtime     06/25/18 2221    omeprazole (PRILOSEC) 40 MG capsule  Daily     06/25/18 2221    US Abdomen Limited     06/25/18 2246           Dahlia Client, MD 06/25/18 2247    Dahlia Client, MD 06/25/18 2248    Arby Barrette, MD 07/05/18 [phone]

## 2018-06-25 NOTE — ED Triage Notes (Signed)
PT reports when he smokes and drinks he then coughs and vomits. Pt denies any diarrhea.

## 2018-06-25 NOTE — ED Notes (Signed)
Provider bedside.

## 2018-06-27 LAB — HEPATITIS PANEL, ACUTE
HCV Ab: <0.1 s/co ratio (ref 0.0–0.9)
Hep A IgM: NEGATIVE
Hep B C IgM: NEGATIVE
Hepatitis B Surface Ag: NEGATIVE

## 2018-09-17 ENCOUNTER — Encounter (HOSPITAL_COMMUNITY): Payer: Self-pay | Admitting: Emergency Medicine

## 2018-09-17 ENCOUNTER — Emergency Department (HOSPITAL_COMMUNITY): Payer: Self-pay

## 2018-09-17 ENCOUNTER — Other Ambulatory Visit: Payer: Self-pay

## 2018-09-17 ENCOUNTER — Emergency Department (HOSPITAL_COMMUNITY)
Admission: EM | Admit: 2018-09-17 | Discharge: 2018-09-17 | Disposition: A | Discharge status: Home / Self Care | Payer: Self-pay | Attending: Emergency Medicine | Admitting: Emergency Medicine

## 2018-09-17 DIAGNOSIS — F17210 Nicotine dependence, cigarettes, uncomplicated: Secondary | ICD-10-CM | POA: Insufficient documentation

## 2018-09-17 DIAGNOSIS — R1013 Epigastric pain: Secondary | ICD-10-CM | POA: Insufficient documentation

## 2018-09-17 DIAGNOSIS — R1011 Right upper quadrant pain: Secondary | ICD-10-CM | POA: Insufficient documentation

## 2018-09-17 DIAGNOSIS — I10 Essential (primary) hypertension: Secondary | ICD-10-CM | POA: Insufficient documentation

## 2018-09-17 DIAGNOSIS — Z79899 Other long term (current) drug therapy: Secondary | ICD-10-CM | POA: Insufficient documentation

## 2018-09-17 DIAGNOSIS — R112 Nausea with vomiting, unspecified: Secondary | ICD-10-CM | POA: Insufficient documentation

## 2018-09-17 LAB — COMPREHENSIVE METABOLIC PANEL
ALT: 98 U/L — ABNORMAL HIGH (ref 0–44)
AST: 56 U/L — ABNORMAL HIGH (ref 15–41)
Albumin: 5.1 g/dL — ABNORMAL HIGH (ref 3.5–5.0)
Alkaline Phosphatase: 78 U/L (ref 38–126)
Anion gap: 15 (ref 5–15)
BUN: 8 mg/dL (ref 6–20)
CO2: 23 mmol/L (ref 22–32)
Calcium: 9.9 mg/dL (ref 8.9–10.3)
Chloride: 103 mmol/L (ref 98–111)
Creatinine, Ser: 1.29 mg/dL — ABNORMAL HIGH (ref 0.61–1.24)
GFR calc Af Amer: >60 mL/min (ref >60)
GFR calc non Af Amer: >60 mL/min (ref >60)
Glucose, Bld: 136 mg/dL — ABNORMAL HIGH (ref 70–99)
Potassium: 3.8 mmol/L (ref 3.5–5.1)
Sodium: 141 mmol/L (ref 135–145)
Total Bilirubin: 0.8 mg/dL (ref 0.3–1.2)
Total Protein: 8.9 g/dL — ABNORMAL HIGH (ref 6.5–8.1)

## 2018-09-17 LAB — CBC WITH DIFFERENTIAL/PLATELET
Abs Immature Granulocytes: 0.11 10*3/uL — ABNORMAL HIGH (ref 0.00–0.07)
Basophils Absolute: 0 10*3/uL (ref 0.0–0.1)
Basophils Relative: 0 %
Eosinophils Absolute: 0 10*3/uL (ref 0.0–0.5)
Eosinophils Relative: 0 %
HCT: 45.7 % (ref 39.0–52.0)
Hemoglobin: 15.1 g/dL (ref 13.0–17.0)
Immature Granulocytes: 1 %
Lymphocytes Relative: 11 %
Lymphs Abs: 1.4 10*3/uL (ref 0.7–4.0)
MCH: 30.1 pg (ref 26.0–34.0)
MCHC: 33 g/dL (ref 30.0–36.0)
MCV: 91.2 fL (ref 80.0–100.0)
Monocytes Absolute: 0.4 10*3/uL (ref 0.1–1.0)
Monocytes Relative: 3 %
Neutro Abs: 10.2 10*3/uL — ABNORMAL HIGH (ref 1.7–7.7)
Neutrophils Relative %: 85 %
Platelets: 295 10*3/uL (ref 150–400)
RBC: 5.01 MIL/uL (ref 4.22–5.81)
RDW: 15.1 % (ref 11.5–15.5)
WBC: 12.1 10*3/uL — ABNORMAL HIGH (ref 4.0–10.5)
nRBC: 0 % (ref 0.0–0.2)

## 2018-09-17 LAB — CBG MONITORING, ED: Glucose-Capillary: 117 mg/dL — ABNORMAL HIGH (ref 70–99)

## 2018-09-17 LAB — LIPASE, BLOOD: Lipase: 37 U/L (ref 11–51)

## 2018-09-17 MED ORDER — SODIUM CHLORIDE 0.9 % IV SOLN
INTRAVENOUS | Status: DC
  Administered 2018-09-17: 13:00:00 via INTRAVENOUS

## 2018-09-17 MED ORDER — ONDANSETRON 8 MG PO TBDP: 8.0000 mg | ORAL_TABLET | Freq: Three times a day (TID) | ORAL | 0 refills | Status: DC | PRN

## 2018-09-17 MED ORDER — MORPHINE SULFATE (PF) 4 MG/ML IV SOLN
4.0000 mg | Freq: Once | INTRAVENOUS | Status: AC
  Administered 2018-09-17: 4 mg via INTRAVENOUS
  Filled 2018-09-17: qty 1

## 2018-09-17 MED ORDER — ONDANSETRON HCL 4 MG/2ML IJ SOLN
4.0000 mg | Freq: Once | INTRAMUSCULAR | Status: AC
  Administered 2018-09-17: 4 mg via INTRAVENOUS
  Filled 2018-09-17: qty 2

## 2018-09-17 MED ORDER — SODIUM CHLORIDE 0.9 % IV BOLUS
1000.0000 mL | Freq: Once | INTRAVENOUS | Status: AC
  Administered 2018-09-17: 1000 mL via INTRAVENOUS

## 2018-09-17 MED ORDER — IOHEXOL 300 MG/ML  SOLN
100.0000 mL | Freq: Once | INTRAMUSCULAR | Status: AC | PRN
  Administered 2018-09-17: 100 mL via INTRAVENOUS

## 2018-09-17 NOTE — ED Notes (Signed)
Pt speaking to wife on cell phone

## 2018-09-17 NOTE — ED Notes (Signed)
Pt states he will message his wife and give her an update.

## 2018-09-17 NOTE — ED Triage Notes (Addendum)
Pt co of stomach pains since 2200 last night rates a 10. Pt reports throwing up all night since 2200 up till 1 hour ago. Pt reports chills. Denies diarrhea. Pt denies fever at home.   Pt is currently nauseated & diaphoretic.

## 2018-09-17 NOTE — Discharge Instructions (Addendum)
Take the medications as prescribed, drink plenty of fluids and slowly advance your diet, your symptoms should resolve over the next couple of days, return as needed for worsening symptoms

## 2018-09-17 NOTE — ED Provider Notes (Signed)
[facility] EMERGENCY DEPARTMENT Provider Note   CSN: [id number] Arrival date & time: 09/17/18  1121    History   Chief Complaint Chief Complaint  Patient presents with   Abdominal Pain   Chills    HPI Derrick Hoffman is a 43 y.o. male.    HPI Patient presents to the emergency room for evaluation of abdominal pain.  Patient states his symptoms started last evening.  Since then he has had multiple episodes of nausea and vomiting.  Patient is having severe pain in his gastric region and right upper quadrant.  It does not radiate.  Nothing seems to help.  He denies any constipation or diarrhea.  No chest pain or shortness of breath.  Patient symptoms have not gotten any better or getting worse.  He cannot keep anything down.  He was not able to take his blood pressure medications this morning.  Patient denies any alcohol use.  He states he has a history of hiatal hernia in the past.  He has had an appendectomy. Past Medical History:  Diagnosis Date   Anxiety    Childhood asthma    History of kidney stones 1990   Hypertension     Patient Active Problem List   Diagnosis Date Noted   Appendicitis 01/05/2018    Past Surgical History:  Procedure Laterality Date   ANTERIOR CRUCIATE LIGAMENT REPAIR Right 1996   APPENDECTOMY  01/05/2018   BUNIONECTOMY Left 2013   LAPAROSCOPIC APPENDECTOMY N/A 01/05/2018   Procedure: APPENDECTOMY LAPAROSCOPIC;  Surgeon: Griselda Miner, MD;  Location: [facility] OR;  Service: General;  Laterality: N/A;        Home Medications    Prior to Admission medications   Medication Sig Start Date End Date Taking? Authorizing Provider  acetaminophen (TYLENOL) 500 MG tablet Take 2 tablets (1,000 mg total) by mouth every 6 (six) hours as needed for mild pain. Patient not taking: Reported on 06/25/2018 01/06/18   Rayburn, Tresa Endo A, PA-C  amLODipine (NORVASC) 10 MG tablet Take 10 mg by mouth daily. 02/02/18   [provider]  amLODipine  (NORVASC) 5 MG tablet Take 1 tablet (5 mg total) by mouth daily. Patient not taking: Reported on 06/25/2018 07/20/17   Belinda Fisher, PA-C  ibuprofen (ADVIL,MOTRIN) 800 MG tablet Take 1 tablet (800 mg total) by mouth every 8 (eight) hours as needed for mild pain. Patient not taking: Reported on 06/25/2018 01/06/18   Rayburn, Alphonsus Sias, PA-C  omeprazole (PRILOSEC) 40 MG capsule Take 40 mg by mouth daily.    [provider]  omeprazole (PRILOSEC) 40 MG capsule Take 1 capsule (40 mg total) by mouth daily for 20 days. 06/25/18 07/15/18  Dahlia Client, MD  ondansetron (ZOFRAN ODT) 8 MG disintegrating tablet Take 1 tablet (8 mg total) by mouth every 8 (eight) hours as needed for nausea or vomiting. 09/17/18   Linwood Dibbles, MD  oxyCODONE (OXY IR/ROXICODONE) 5 MG immediate release tablet Take 1-2 tablets (5-10 mg total) by mouth every 6 (six) hours as needed for moderate pain or severe pain (  for moderate pain,  for severe pain). Patient not taking: Reported on 06/25/2018 01/06/18   Rayburn, Tresa Endo A, PA-C  sertraline (ZOLOFT) 50 MG tablet Take 50 mg by mouth daily.    [provider]  sucralfate (CARAFATE) 1 g tablet Take 1 tablet (1 g total) by mouth 4 (four) times daily -  with meals and at bedtime for 10 days. 06/25/18 07/05/18  Dahlia Client, MD  Vitamin D, Ergocalciferol, (DRISDOL) 1.25 MG (50000 UT) CAPS capsule Take 50,000 Units by mouth every 7 (seven) days. 01/12/18   [provider]    Family History No family history on file.  Social History Social History   Tobacco Use   Smoking status: Current Every Day Smoker    Packs/day: 0.50    Years: 1.00    Pack years: 0.50   Smokeless tobacco: Never Used  Substance Use Topics   Alcohol use: Yes    Alcohol/week: 48.0 standard drinks    Types: 48 Shots of liquor per week    Frequency: Never    Comment: 01/05/2018 " 5th of liquor/day;  3 days/wk   Drug use: Not Currently     Allergies   Penicillins   Review of  Systems Review of Systems  All other systems reviewed and are negative.    Physical Exam Updated Vital Signs BP (!) 134/91    Pulse 97    Temp 98.4 F (36.9 C) (Oral)    Resp (!) 24    SpO2 92%   Physical Exam Vitals signs and nursing note reviewed.  Constitutional:      Appearance: He is well-developed. He is ill-appearing.  HENT:     Head: Normocephalic and atraumatic.     Right Ear: External ear normal.     Left Ear: External ear normal.  Eyes:     General: No scleral icterus.       Right eye: No discharge.        Left eye: No discharge.     Conjunctiva/sclera: Conjunctivae normal.  Neck:     Musculoskeletal: Neck supple.     Trachea: No tracheal deviation.  Cardiovascular:     Rate and Rhythm: Normal rate and regular rhythm.  Pulmonary:     Effort: Pulmonary effort is normal. No respiratory distress.     Breath sounds: Normal breath sounds. No stridor. No wheezing or rales.  Abdominal:     General: Bowel sounds are normal. There is no distension.     Palpations: Abdomen is soft.     Tenderness: There is abdominal tenderness in the right upper quadrant and epigastric area. There is no guarding or rebound.  Musculoskeletal:        General: No tenderness.  Skin:    General: Skin is warm.     Findings: No rash.     Comments: Diaphoretic  Neurological:     Mental Status: He is alert.     Cranial Nerves: No cranial nerve deficit (no facial droop, extraocular movements intact, no slurred speech).     Sensory: No sensory deficit.     Motor: No abnormal muscle tone or seizure activity.     Coordination: Coordination normal.      ED Treatments / Results  Labs (all labs ordered are listed, but only abnormal results are displayed) Labs Reviewed  COMPREHENSIVE METABOLIC PANEL - Abnormal; Notable for the following components:      Result Value   Glucose, Bld 136 (*)    Creatinine, Ser 1.29 (*)    Total Protein 8.9 (*)    Albumin 5.1 (*)    AST 56 (*)    ALT 98 (*)     All other components within normal limits  CBC WITH DIFFERENTIAL/PLATELET - Abnormal; Notable for the following components:   WBC 12.1 (*)    Neutro Abs 10.2 (*)    Abs Immature Granulocytes 0.11 (*)    All other components within normal  limits  CBG MONITORING, ED - Abnormal; Notable for the following components:   Glucose-Capillary 117 (*)    All other components within normal limits  LIPASE, BLOOD  URINALYSIS, ROUTINE W REFLEX MICROSCOPIC    EKG EKG Interpretation  Date/Time:  Saturday Sep 17 2018 11:42:23 EDT Ventricular Rate:  84 PR Interval:    QRS Duration: 106 QT Interval:  413 QTC Calculation: 489 R Axis:   -9 Text Interpretation:  Sinus rhythm Probable left ventricular hypertrophy Borderline T abnormalities, inferior leads Borderline prolonged QT interval lateral t wave changes decreased since last tracing Confirmed by Linwood Dibbles [phone]) on 09/17/2018 12:36:57 PM Also confirmed by Linwood Dibbles [phone]), editor Barbette Hair [phone])  on 09/17/2018 2:35:12 PM   Radiology Ct Abdomen Pelvis W Contrast  Result Date: 09/17/2018 CLINICAL DATA:  Diffuse abdominal pain.  Nausea and vomiting. EXAM: CT ABDOMEN AND PELVIS WITH CONTRAST TECHNIQUE: Multidetector CT imaging of the abdomen and pelvis was performed using the standard protocol following bolus administration of intravenous contrast. CONTRAST:  OMNIPAQUE IOHEXOL 300 MG/ML  SOLN COMPARISON:  KUB Sep 17, 2018.  CT scan January 05, 2018. FINDINGS: Lower chest: No acute abnormality. Hepatobiliary: Hepatic steatosis. The liver, gallbladder, and portal vein are otherwise normal. Pancreas: Unremarkable. No pancreatic ductal dilatation or surrounding inflammatory changes. Spleen: Normal in size without focal abnormality. Adrenals/Urinary Tract: Adrenal glands are unremarkable. Kidneys are normal, without renal calculi, focal lesion, or hydronephrosis. Bladder is unremarkable. Stomach/Bowel: The stomach and small bowel are normal. A  few scattered colonic diverticuli are identified without diverticulitis. The colon is otherwise normal. The patient appears to be status post appendectomy. Vascular/Lymphatic: Mild atherosclerotic changes are seen in the nonaneurysmal aorta. No adenopathy. Reproductive: Prostate is unremarkable. Other: There is a fat containing periumbilical hernia. No free air or free fluid. Musculoskeletal: No acute or significant osseous findings. IMPRESSION: 1. No cause for the patient's symptoms identified. 2. Hepatic steatosis. 3. Mild atherosclerotic changes in the nonaneurysmal aorta. 4. Fat containing periumbilical hernia. Electronically Signed   By: Gerome Sam III M.D   On: 09/17/2018 17:02   Dg Abd Acute W/chest  Result Date: 09/17/2018 CLINICAL DATA:  Abdominal pain. EXAM: DG ABDOMEN ACUTE W/ 1V CHEST COMPARISON:  01/05/2018 FINDINGS: There is no evidence of dilated bowel loops or free intraperitoneal air. No radiopaque calculi or other significant radiographic abnormality is seen. Heart size and mediastinal contours are within normal limits. Both lungs are clear. IMPRESSION: Negative abdominal radiographs.  No acute cardiopulmonary disease. Electronically Signed   By: Signa Kell M.D.   On: 09/17/2018 12:56    Procedures Procedures (including critical care time)  Medications Ordered in ED Medications  sodium chloride 0.9 % bolus 1,000 mL (0 mLs Intravenous Stopped 09/17/18 1314)    And  0.9 %  sodium chloride infusion ( Intravenous New Bag/Given 09/17/18 1315)  morphine 4 MG/ML injection 4 mg (4 mg Intravenous Given 09/17/18 1238)  ondansetron (ZOFRAN) injection 4 mg (4 mg Intravenous Given 09/17/18 1238)  iohexol (OMNIPAQUE) 300 MG/ML solution 100 mL (100 mLs Intravenous Contrast Given 09/17/18 1604)     Initial Impression / Assessment and Plan / ED Course  I have reviewed the triage vital signs and the nursing notes.  Pertinent labs & imaging results that were available during my care of the  patient were reviewed by me and considered in my medical decision making (see chart for details).  Clinical Course as of Sep 17 1731  Sat Sep 17, 2018  1423 No acute  findings noted on laboratory test to account for the patient's pain.  Will CT to evaluate further.   [JK]  1423 Lfts are mildly elevated but this appears to be a chronic issue   [JK]  1732 CT scan findings reviewed.  No acute abnormalities noted.  Patient symptoms have improved.  No further vomiting.  He is tolerating fluids.   [JK]    Clinical Course User Index [JK] Linwood Dibbles, MD     Patient presented to the emergency room with complaints of nausea vomiting and abdominal pain.  ED work-up ultimately has been reassuring.  No signs of obstruction or acute infection.  Patient has been hydrated and given antiemetics.  His symptoms have improved and he is feeling much better.  At this point I suspect possible viral etiology.  Plan on discharge home with antiemetics.  Warning signs were precautions discussed.   Final Clinical Impressions(s) / ED Diagnoses   Final diagnoses:  Non-intractable vomiting with nausea, unspecified vomiting type    ED Discharge Orders         Ordered    ondansetron (ZOFRAN ODT) 8 MG disintegrating tablet  Every 8 hours PRN     09/17/18 1722           Linwood Dibbles, MD 09/17/18 1733

## 2018-09-17 NOTE — ED Notes (Signed)
Patient transported to CT 

## 2018-09-17 NOTE — ED Notes (Signed)
Pt Derrick Hoffman [phone]

## 2018-09-18 ENCOUNTER — Emergency Department (HOSPITAL_COMMUNITY)
Admission: EM | Admit: 2018-09-18 | Discharge: 2018-09-18 | Disposition: A | Discharge status: Home / Self Care | Payer: Self-pay | Attending: Emergency Medicine | Admitting: Emergency Medicine

## 2018-09-18 ENCOUNTER — Encounter (HOSPITAL_COMMUNITY): Payer: Self-pay

## 2018-09-18 DIAGNOSIS — J45909 Unspecified asthma, uncomplicated: Secondary | ICD-10-CM | POA: Insufficient documentation

## 2018-09-18 DIAGNOSIS — F17200 Nicotine dependence, unspecified, uncomplicated: Secondary | ICD-10-CM | POA: Insufficient documentation

## 2018-09-18 DIAGNOSIS — F1290 Cannabis use, unspecified, uncomplicated: Secondary | ICD-10-CM

## 2018-09-18 DIAGNOSIS — R112 Nausea with vomiting, unspecified: Secondary | ICD-10-CM | POA: Insufficient documentation

## 2018-09-18 DIAGNOSIS — Z79899 Other long term (current) drug therapy: Secondary | ICD-10-CM | POA: Insufficient documentation

## 2018-09-18 DIAGNOSIS — I10 Essential (primary) hypertension: Secondary | ICD-10-CM | POA: Insufficient documentation

## 2018-09-18 DIAGNOSIS — F12188 Cannabis abuse with other cannabis-induced disorder: Secondary | ICD-10-CM | POA: Insufficient documentation

## 2018-09-18 DIAGNOSIS — E860 Dehydration: Secondary | ICD-10-CM | POA: Insufficient documentation

## 2018-09-18 LAB — COMPREHENSIVE METABOLIC PANEL
ALT: 83 U/L — ABNORMAL HIGH (ref 0–44)
AST: 42 U/L — ABNORMAL HIGH (ref 15–41)
Albumin: 5.5 g/dL — ABNORMAL HIGH (ref 3.5–5.0)
Alkaline Phosphatase: 85 U/L (ref 38–126)
Anion gap: 11 (ref 5–15)
BUN: 11 mg/dL (ref 6–20)
CO2: 28 mmol/L (ref 22–32)
Calcium: 9.4 mg/dL (ref 8.9–10.3)
Chloride: 103 mmol/L (ref 98–111)
Creatinine, Ser: 1.1 mg/dL (ref 0.61–1.24)
GFR calc Af Amer: >60 mL/min (ref >60)
GFR calc non Af Amer: >60 mL/min (ref >60)
Glucose, Bld: 102 mg/dL — ABNORMAL HIGH (ref 70–99)
Potassium: 3.5 mmol/L (ref 3.5–5.1)
Sodium: 142 mmol/L (ref 135–145)
Total Bilirubin: 0.7 mg/dL (ref 0.3–1.2)
Total Protein: 9.6 g/dL — ABNORMAL HIGH (ref 6.5–8.1)

## 2018-09-18 LAB — CBC
HCT: 47.2 % (ref 39.0–52.0)
Hemoglobin: 15.5 g/dL (ref 13.0–17.0)
MCH: 30.7 pg (ref 26.0–34.0)
MCHC: 32.8 g/dL (ref 30.0–36.0)
MCV: 93.5 fL (ref 80.0–100.0)
Platelets: 302 10*3/uL (ref 150–400)
RBC: 5.05 MIL/uL (ref 4.22–5.81)
RDW: 15.8 % — ABNORMAL HIGH (ref 11.5–15.5)
WBC: 14.8 10*3/uL — ABNORMAL HIGH (ref 4.0–10.5)
nRBC: 0 % (ref 0.0–0.2)

## 2018-09-18 LAB — RAPID URINE DRUG SCREEN, HOSP PERFORMED
Amphetamines: NOT DETECTED
Barbiturates: NOT DETECTED
Benzodiazepines: NOT DETECTED
Cocaine: NOT DETECTED
Opiates: POSITIVE — AB
Tetrahydrocannabinol: POSITIVE — AB

## 2018-09-18 LAB — LIPASE, BLOOD: Lipase: 53 U/L — ABNORMAL HIGH (ref 11–51)

## 2018-09-18 MED ORDER — ONDANSETRON HCL 4 MG/2ML IJ SOLN
4.0000 mg | Freq: Once | INTRAMUSCULAR | Status: AC
  Administered 2018-09-18: 4 mg via INTRAVENOUS
  Filled 2018-09-18: qty 2

## 2018-09-18 MED ORDER — SODIUM CHLORIDE 0.9 % IV BOLUS
1000.0000 mL | Freq: Once | INTRAVENOUS | Status: AC
  Administered 2018-09-18: 12:00:00 1000 mL via INTRAVENOUS

## 2018-09-18 MED ORDER — SODIUM CHLORIDE 0.9 % IV BOLUS
1000.0000 mL | Freq: Once | INTRAVENOUS | Status: AC
  Administered 2018-09-18: 1000 mL via INTRAVENOUS

## 2018-09-18 MED ORDER — HYDROMORPHONE HCL 1 MG/ML IJ SOLN
0.5000 mg | Freq: Once | INTRAMUSCULAR | Status: AC
  Administered 2018-09-18: 0.5 mg via INTRAVENOUS
  Filled 2018-09-18: qty 1

## 2018-09-18 NOTE — ED Notes (Signed)
Pt given ice water.

## 2018-09-18 NOTE — ED Provider Notes (Addendum)
[facility]-EMERGENCY DEPT Provider Note   CSN: [id number] Arrival date & time: 09/18/18  1019    History   Chief Complaint Chief Complaint  Patient presents with   Emesis   Abdominal Pain    HPI Derrick Hoffman is a 43 y.o. male.     Patient c/o recurrent nausea and vomiting, and epigastric pain since last evening. Symptoms acute onset, moderate/severe, persistent/recurrent. Emesis clear or color recently ingested fluids, not bloody or bilious. Recently seen in ED for same, ct then neg for acute process. Patient had normal bm today. Denies diarrhea. No abd distension. Denies hx pud, gallstones, or pancreatitis. No fever or chills. No known bad food ingestion or ill contacts. Denies chest pain or sob.   The history is provided by the patient.  Emesis  Associated symptoms: abdominal pain   Associated symptoms: no cough, no diarrhea, no fever, no headaches and no sore throat   Abdominal Pain  Associated symptoms: nausea and vomiting   Associated symptoms: no chest pain, no cough, no diarrhea, no dysuria, no fever, no shortness of breath and no sore throat     Past Medical History:  Diagnosis Date   Anxiety    Childhood asthma    History of kidney stones 1990   Hypertension     Patient Active Problem List   Diagnosis Date Noted   Appendicitis 01/05/2018    Past Surgical History:  Procedure Laterality Date   ANTERIOR CRUCIATE LIGAMENT REPAIR Right 1996   APPENDECTOMY  01/05/2018   BUNIONECTOMY Left 2013   LAPAROSCOPIC APPENDECTOMY N/A 01/05/2018   Procedure: APPENDECTOMY LAPAROSCOPIC;  Surgeon: Griselda Miner, MD;  Location: [facility] OR;  Service: General;  Laterality: N/A;        Home Medications    Prior to Admission medications   Medication Sig Start Date End Date Taking? Authorizing Provider  acetaminophen (TYLENOL) 500 MG tablet Take 2 tablets (1,000 mg total) by mouth every 6 (six) hours as needed for mild pain. Patient not taking:  Reported on 06/25/2018 01/06/18   Rayburn, Tresa Endo A, PA-C  amLODipine (NORVASC) 10 MG tablet Take 10 mg by mouth daily. 02/02/18   [provider]  amLODipine (NORVASC) 5 MG tablet Take 1 tablet (5 mg total) by mouth daily. Patient not taking: Reported on 06/25/2018 07/20/17   Belinda Fisher, PA-C  ibuprofen (ADVIL,MOTRIN) 800 MG tablet Take 1 tablet (800 mg total) by mouth every 8 (eight) hours as needed for mild pain. Patient not taking: Reported on 06/25/2018 01/06/18   Rayburn, Alphonsus Sias, PA-C  omeprazole (PRILOSEC) 40 MG capsule Take 40 mg by mouth daily.    [provider]  omeprazole (PRILOSEC) 40 MG capsule Take 1 capsule (40 mg total) by mouth daily for 20 days. 06/25/18 07/15/18  Dahlia Client, MD  ondansetron (ZOFRAN ODT) 8 MG disintegrating tablet Take 1 tablet (8 mg total) by mouth every 8 (eight) hours as needed for nausea or vomiting. 09/17/18   Linwood Dibbles, MD  oxyCODONE (OXY IR/ROXICODONE) 5 MG immediate release tablet Take 1-2 tablets (5-10 mg total) by mouth every 6 (six) hours as needed for moderate pain or severe pain (5mg  for moderate pain, 10mg  for severe pain). Patient not taking: Reported on 06/25/2018 01/06/18   Rayburn, Tresa Endo A, PA-C  sertraline (ZOLOFT) 50 MG tablet Take 50 mg by mouth daily.    [provider]  sucralfate (CARAFATE) 1 g tablet Take 1 tablet (1 g total) by mouth 4 (four) times daily -  with meals and at bedtime for 10 days. 06/25/18 07/05/18  Dahlia Client, MD  Vitamin D, Ergocalciferol, (DRISDOL) 1.25 MG (50000 UT) CAPS capsule Take 50,000 Units by mouth every 7 (seven) days. 01/12/18   [provider]    Family History History reviewed. No pertinent family history.  Social History Social History   Tobacco Use   Smoking status: Current Every Day Smoker    Packs/day: 0.50    Years: 1.00    Pack years: 0.50   Smokeless tobacco: Never Used  Substance Use Topics   Alcohol use: Yes    Alcohol/week: 48.0 standard drinks    Types:  48 Shots of liquor per week    Frequency: Never    Comment: 01/05/2018 " 5th of liquor/day;  3 days/wk   Drug use: Not Currently     Allergies   Penicillins   Review of Systems Review of Systems  Constitutional: Negative for fever.  HENT: Negative for sore throat.   Eyes: Negative for redness.  Respiratory: Negative for cough and shortness of breath.   Cardiovascular: Negative for chest pain.  Gastrointestinal: Positive for abdominal pain, nausea and vomiting. Negative for diarrhea.  Endocrine: Negative for polyuria.  Genitourinary: Negative for dysuria and flank pain.  Musculoskeletal: Negative for back pain and neck pain.  Skin: Negative for rash.  Neurological: Negative for headaches.  Hematological: Does not bruise/bleed easily.  Psychiatric/Behavioral: Negative for confusion.     Physical Exam Updated Vital Signs BP (!) 178/112 (BP Location: Left Arm)    Pulse 88    Temp 100.3 F (37.9 C) (Oral)    Resp 16    Ht 1.88 m ( )    Wt 104.3 kg    SpO2 100%    BMI 29.53 kg/m   Physical Exam Vitals signs and nursing note reviewed.  Constitutional:      Appearance: Normal appearance. He is well-developed.  HENT:     Head: Atraumatic.     Nose: Nose normal.     Mouth/Throat:     Mouth: Mucous membranes are moist.     Pharynx: Oropharynx is clear.  Eyes:     General: No scleral icterus.    Conjunctiva/sclera: Conjunctivae normal.  Neck:     Musculoskeletal: Normal range of motion and neck supple. No neck rigidity.     Trachea: No tracheal deviation.  Cardiovascular:     Rate and Rhythm: Normal rate and regular rhythm.     Pulses: Normal pulses.     Heart sounds: Normal heart sounds. No murmur. No friction rub. No gallop.   Pulmonary:     Effort: Pulmonary effort is normal. No accessory muscle usage or respiratory distress.     Breath sounds: Normal breath sounds.  Abdominal:     General: Bowel sounds are normal. There is no distension.     Palpations: Abdomen  is soft. There is no mass.     Tenderness: There is abdominal tenderness. There is no guarding or rebound.     Comments: Epigastric tenderness.  No incarcerated hernia.   Genitourinary:    Comments: No cva tenderness. Musculoskeletal:        General: No swelling.  Skin:    General: Skin is warm and dry.     Findings: No rash.  Neurological:     Mental Status: He is alert.     Comments: Alert, speech clear.   Psychiatric:        Mood and Affect: Mood normal.  ED Treatments / Results  Labs (all labs ordered are listed, but only abnormal results are displayed) Results for orders placed or performed during the hospital encounter of 09/18/18  CBC  Result Value Ref Range   WBC 14.8 (H) 4.0 - 10.5 K/uL   RBC 5.05 4.22 - 5.81 MIL/uL   Hemoglobin 15.5 13.0 - 17.0 g/dL   HCT 11.9 14.7 - 82.9 %   MCV 93.5 80.0 - 100.0 fL   MCH 30.7 26.0 - 34.0 pg   MCHC 32.8 30.0 - 36.0 g/dL   RDW 56.2 (H) 13.0 - 86.5 %   Platelets 302 150 - 400 K/uL   nRBC 0.0 0.0 - 0.2 %  Comprehensive metabolic panel  Result Value Ref Range   Sodium 142 135 - 145 mmol/L   Potassium 3.5 3.5 - 5.1 mmol/L   Chloride 103 98 - 111 mmol/L   CO2 28 22 - 32 mmol/L   Glucose, Bld 102 (H) 70 - 99 mg/dL   BUN 11 6 - 20 mg/dL   Creatinine, Ser 7.84 0.61 - 1.24 mg/dL   Calcium 9.4 8.9 - 69.6 mg/dL   Total Protein 9.6 (H) 6.5 - 8.1 g/dL   Albumin 5.5 (H) 3.5 - 5.0 g/dL   AST 42 (H) 15 - 41 U/L   ALT 83 (H) 0 - 44 U/L   Alkaline Phosphatase 85 38 - 126 U/L   Total Bilirubin 0.7 0.3 - 1.2 mg/dL   GFR calc non Af Amer >60 >60 mL/min   GFR calc Af Amer >60 >60 mL/min   Anion gap 11 5 - 15  Lipase, blood  Result Value Ref Range   Lipase 53 (H) 11 - 51 U/L  Rapid urine drug screen (hospital performed)  Result Value Ref Range   Opiates POSITIVE (A) NONE DETECTED   Cocaine NONE DETECTED NONE DETECTED   Benzodiazepines NONE DETECTED NONE DETECTED   Amphetamines NONE DETECTED NONE DETECTED   Tetrahydrocannabinol  POSITIVE (A) NONE DETECTED   Barbiturates NONE DETECTED NONE DETECTED   Ct Abdomen Pelvis W Contrast  Result Date: 09/17/2018 CLINICAL DATA:  Diffuse abdominal pain.  Nausea and vomiting. EXAM: CT ABDOMEN AND PELVIS WITH CONTRAST TECHNIQUE: Multidetector CT imaging of the abdomen and pelvis was performed using the standard protocol following bolus administration of intravenous contrast. CONTRAST:  OMNIPAQUE IOHEXOL 300 MG/ML  SOLN COMPARISON:  K[facility] Sep 17, 2018.  CT scan January 05, 2018. FINDINGS: Lower chest: No acute abnormality. Hepatobiliary: Hepatic steatosis. The liver, gallbladder, and portal vein are otherwise normal. Pancreas: Unremarkable. No pancreatic ductal dilatation or surrounding inflammatory changes. Spleen: Normal in size without focal abnormality. Adrenals/Urinary Tract: Adrenal glands are unremarkable. Kidneys are normal, without renal calculi, focal lesion, or hydronephrosis. Bladder is unremarkable. Stomach/Bowel: The stomach and small bowel are normal. A few scattered colonic diverticuli are identified without diverticulitis. The colon is otherwise normal. The patient appears to be status post appendectomy. Vascular/Lymphatic: Mild atherosclerotic changes are seen in the nonaneurysmal aorta. No adenopathy. Reproductive: Prostate is unremarkable. Other: There is a fat containing periumbilical hernia. No free air or free fluid. Musculoskeletal: No acute or significant osseous findings. IMPRESSION: 1. No cause for the patient's symptoms identified. 2. Hepatic steatosis. 3. Mild atherosclerotic changes in the nonaneurysmal aorta. 4. Fat containing periumbilical hernia. Electronically Signed   By: Gerome Sam III M.D   On: 09/17/2018 17:02   Dg Abd Acute W/chest  Result Date: 09/17/2018 CLINICAL DATA:  Abdominal pain. EXAM: DG ABDOMEN ACUTE W/ 1V  CHEST COMPARISON:  01/05/2018 FINDINGS: There is no evidence of dilated bowel loops or free intraperitoneal air. No radiopaque calculi  or other significant radiographic abnormality is seen. Heart size and mediastinal contours are within normal limits. Both lungs are clear. IMPRESSION: Negative abdominal radiographs.  No acute cardiopulmonary disease. Electronically Signed   By: Signa Kellaylor  Stroud M.D.   On: 09/17/2018 12:56    EKG None  Radiology Ct Abdomen Pelvis W Contrast  Result Date: 09/17/2018 CLINICAL DATA:  Diffuse abdominal pain.  Nausea and vomiting. EXAM: CT ABDOMEN AND PELVIS WITH CONTRAST TECHNIQUE: Multidetector CT imaging of the abdomen and pelvis was performed using the standard protocol following bolus administration of intravenous contrast. CONTRAST:  100mL OMNIPAQUE IOHEXOL 300 MG/ML  SOLN COMPARISON:  KUB Sep 17, 2018.  CT scan January 05, 2018. FINDINGS: Lower chest: No acute abnormality. Hepatobiliary: Hepatic steatosis. The liver, gallbladder, and portal vein are otherwise normal. Pancreas: Unremarkable. No pancreatic ductal dilatation or surrounding inflammatory changes. Spleen: Normal in size without focal abnormality. Adrenals/Urinary Tract: Adrenal glands are unremarkable. Kidneys are normal, without renal calculi, focal lesion, or hydronephrosis. Bladder is unremarkable. Stomach/Bowel: The stomach and small bowel are normal. A few scattered colonic diverticuli are identified without diverticulitis. The colon is otherwise normal. The patient appears to be status post appendectomy. Vascular/Lymphatic: Mild atherosclerotic changes are seen in the nonaneurysmal aorta. No adenopathy. Reproductive: Prostate is unremarkable. Other: There is a fat containing periumbilical hernia. No free air or free fluid. Musculoskeletal: No acute or significant osseous findings. IMPRESSION: 1. No cause for the patient's symptoms identified. 2. Hepatic steatosis. 3. Mild atherosclerotic changes in the nonaneurysmal aorta. 4. Fat containing periumbilical hernia. Electronically Signed   By: Gerome Samavid  Williams III M.D   On: 09/17/2018 17:02    Dg Abd Acute W/chest  Result Date: 09/17/2018 CLINICAL DATA:  Abdominal pain. EXAM: DG ABDOMEN ACUTE W/ 1V CHEST COMPARISON:  01/05/2018 FINDINGS: There is no evidence of dilated bowel loops or free intraperitoneal air. No radiopaque calculi or other significant radiographic abnormality is seen. Heart size and mediastinal contours are within normal limits. Both lungs are clear. IMPRESSION: Negative abdominal radiographs.  No acute cardiopulmonary disease. Electronically Signed   By: Signa Kellaylor  Stroud M.D.   On: 09/17/2018 12:56    Procedures Procedures (including critical care time)  Medications Ordered in ED Medications  HYDROmorphone (DILAUDID) injection 0.5 mg (has no administration in time range)  ondansetron (ZOFRAN) injection 4 mg (has no administration in time range)  sodium chloride 0.9 % bolus 1,000 mL (has no administration in time range)     Initial Impression / Assessment and Plan / ED Course  I have reviewed the triage vital signs and the nursing notes.  Pertinent labs & imaging results that were available during my care of the patient were reviewed by me and considered in my medical decision making (see chart for details).  Iv ns bolus. zofran iv. Dilaudid iv.   Stat labs sent.  Reviewed nursing notes and prior charts for additional history.  Recent ct neg for acute process.   Labs reviewed by me - chem normal. uds + thc.   Recheck abd soft nt.   Tolerating po fluids. No recurrent nv. No diarrhea.   Recheck again, resting comfortably. 2nd liter ns given.  Pt appears stable for d/c.  Suspect possible thc associated hyperemesis syndrome.   Return precautions provided.    Final Clinical Impressions(s) / ED Diagnoses   Final diagnoses:  None    ED Discharge Orders  None           Cathren Laine, MD 09/18/18 1555

## 2018-09-18 NOTE — ED Triage Notes (Addendum)
Patient c/o n/v x2 days.   C/o upper abdominal pain 10/10.  Reports being nauseous here in triage.   9 Occurrences of emesis in past 24 hours. Denies diarrhea, shob, or being around anyone sick.    Patient went to ED yesterday for n/v. Patient given nausea medication and pain medication. Patient went home had a burger and started vomiting again.   Patient states he is dehydrated.   A/Ox4 Ambulatory in triage.

## 2018-09-18 NOTE — ED Notes (Signed)
Bed: WA19 Expected date:  Expected time:  Means of arrival:  Comments: Triage 1  

## 2018-09-18 NOTE — Discharge Instructions (Signed)
It was our pleasure to provide your ER care today - we hope that you feel better.  Drink plenty of fluids. Take zofran as need for nausea.   Please note that increasingly we are seeing marijuana/thc as a cause of a recurrent vomiting and upper abdominal pain and vomiting syndrome. Avoid marijuana use may well help avoid these symptoms.   Follow up with primary care doctor in the coming week.   Return to ER if worse, new symptoms, new or severe pain, persistent vomiting, fevers, other concern.   You were given pain medication in the ER  - no driving for the next 6 hours.

## 2018-10-20 ENCOUNTER — Emergency Department (HOSPITAL_COMMUNITY)
Admission: EM | Admit: 2018-10-20 | Discharge: 2018-10-21 | Disposition: A | Discharge status: Home / Self Care | Payer: Self-pay | Attending: Emergency Medicine | Admitting: Emergency Medicine

## 2018-10-20 ENCOUNTER — Encounter (HOSPITAL_COMMUNITY): Payer: Self-pay | Admitting: Emergency Medicine

## 2018-10-20 ENCOUNTER — Emergency Department (HOSPITAL_COMMUNITY): Payer: Self-pay

## 2018-10-20 ENCOUNTER — Other Ambulatory Visit: Payer: Self-pay

## 2018-10-20 DIAGNOSIS — S0990XA Unspecified injury of head, initial encounter: Secondary | ICD-10-CM

## 2018-10-20 DIAGNOSIS — Y9355 Activity, bike riding: Secondary | ICD-10-CM | POA: Insufficient documentation

## 2018-10-20 DIAGNOSIS — H538 Other visual disturbances: Secondary | ICD-10-CM | POA: Insufficient documentation

## 2018-10-20 DIAGNOSIS — F17210 Nicotine dependence, cigarettes, uncomplicated: Secondary | ICD-10-CM | POA: Insufficient documentation

## 2018-10-20 DIAGNOSIS — Z23 Encounter for immunization: Secondary | ICD-10-CM | POA: Insufficient documentation

## 2018-10-20 DIAGNOSIS — I10 Essential (primary) hypertension: Secondary | ICD-10-CM | POA: Insufficient documentation

## 2018-10-20 DIAGNOSIS — Z79899 Other long term (current) drug therapy: Secondary | ICD-10-CM | POA: Insufficient documentation

## 2018-10-20 DIAGNOSIS — J45909 Unspecified asthma, uncomplicated: Secondary | ICD-10-CM | POA: Insufficient documentation

## 2018-10-20 DIAGNOSIS — Y92838 Other recreation area as the place of occurrence of the external cause: Secondary | ICD-10-CM | POA: Insufficient documentation

## 2018-10-20 DIAGNOSIS — Y998 Other external cause status: Secondary | ICD-10-CM | POA: Insufficient documentation

## 2018-10-20 DIAGNOSIS — S0292XA Unspecified fracture of facial bones, initial encounter for closed fracture: Secondary | ICD-10-CM | POA: Insufficient documentation

## 2018-10-20 LAB — CBC WITH DIFFERENTIAL/PLATELET
Abs Immature Granulocytes: 0.06 10*3/uL (ref 0.00–0.07)
Basophils Absolute: 0 10*3/uL (ref 0.0–0.1)
Basophils Relative: 0 %
Eosinophils Absolute: 0.1 10*3/uL (ref 0.0–0.5)
Eosinophils Relative: 1 %
HCT: 43.3 % (ref 39.0–52.0)
Hemoglobin: 14.3 g/dL (ref 13.0–17.0)
Immature Granulocytes: 1 %
Lymphocytes Relative: 29 %
Lymphs Abs: 3.7 10*3/uL (ref 0.7–4.0)
MCH: 31 pg (ref 26.0–34.0)
MCHC: 33 g/dL (ref 30.0–36.0)
MCV: 93.7 fL (ref 80.0–100.0)
Monocytes Absolute: 1 10*3/uL (ref 0.1–1.0)
Monocytes Relative: 8 %
Neutro Abs: 7.9 10*3/uL — ABNORMAL HIGH (ref 1.7–7.7)
Neutrophils Relative %: 61 %
Platelets: 243 10*3/uL (ref 150–400)
RBC: 4.62 MIL/uL (ref 4.22–5.81)
RDW: 16.8 % — ABNORMAL HIGH (ref 11.5–15.5)
WBC: 12.9 10*3/uL — ABNORMAL HIGH (ref 4.0–10.5)
nRBC: 0 % (ref 0.0–0.2)

## 2018-10-20 LAB — COMPREHENSIVE METABOLIC PANEL
ALT: 52 U/L — ABNORMAL HIGH (ref 0–44)
AST: 30 U/L (ref 15–41)
Albumin: 4.3 g/dL (ref 3.5–5.0)
Alkaline Phosphatase: 72 U/L (ref 38–126)
Anion gap: 14 (ref 5–15)
BUN: 7 mg/dL (ref 6–20)
CO2: 21 mmol/L — ABNORMAL LOW (ref 22–32)
Calcium: 9.2 mg/dL (ref 8.9–10.3)
Chloride: 103 mmol/L (ref 98–111)
Creatinine, Ser: 1.11 mg/dL (ref 0.61–1.24)
GFR calc Af Amer: >60 mL/min (ref >60)
GFR calc non Af Amer: >60 mL/min (ref >60)
Glucose, Bld: 105 mg/dL — ABNORMAL HIGH (ref 70–99)
Potassium: 3.2 mmol/L — ABNORMAL LOW (ref 3.5–5.1)
Sodium: 138 mmol/L (ref 135–145)
Total Bilirubin: 0.7 mg/dL (ref 0.3–1.2)
Total Protein: 7.6 g/dL (ref 6.5–8.1)

## 2018-10-20 MED ORDER — TETRACAINE HCL 0.5 % OP SOLN
2.0000 [drp] | Freq: Once | OPHTHALMIC | Status: AC
  Administered 2018-10-20: 2 [drp] via OPHTHALMIC
  Filled 2018-10-20: qty 4

## 2018-10-20 MED ORDER — FENTANYL CITRATE (PF) 100 MCG/2ML IJ SOLN
50.0000 ug | Freq: Once | INTRAMUSCULAR | Status: AC
  Administered 2018-10-20: 22:00:00 50 ug via INTRAVENOUS
  Filled 2018-10-20: qty 2

## 2018-10-20 MED ORDER — FLUORESCEIN SODIUM 1 MG OP STRP
1.0000 | ORAL_STRIP | Freq: Once | OPHTHALMIC | Status: AC
  Administered 2018-10-20: 1 via OPHTHALMIC
  Filled 2018-10-20: qty 1

## 2018-10-20 MED ORDER — ERYTHROMYCIN 5 MG/GM OP OINT
1.0000 "application " | TOPICAL_OINTMENT | Freq: Once | OPHTHALMIC | Status: AC
  Administered 2018-10-20: 1 via OPHTHALMIC
  Filled 2018-10-20: qty 3.5

## 2018-10-20 MED ORDER — CEPHALEXIN 250 MG PO CAPS
500.0000 mg | ORAL_CAPSULE | Freq: Once | ORAL | Status: AC
  Administered 2018-10-20: 500 mg via ORAL
  Filled 2018-10-20: qty 2
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
## 2018-10-21 MED ORDER — DOXYCYCLINE HYCLATE 100 MG PO CAPS: 100.0000 mg | ORAL_CAPSULE | Freq: Two times a day (BID) | ORAL | 0 refills | Status: DC

## 2018-10-21 MED ORDER — ACETAMINOPHEN 325 MG PO TABS
650.0000 mg | ORAL_TABLET | Freq: Once | ORAL | Status: AC
  Administered 2018-10-21: 650 mg via ORAL
  Filled 2018-10-21: qty 2

## 2018-10-21 MED ORDER — CEPHALEXIN 500 MG PO CAPS: 500.0000 mg | ORAL_CAPSULE | Freq: Four times a day (QID) | ORAL | 0 refills | Status: AC

## 2018-10-21 MED ORDER — TETANUS-DIPHTH-ACELL PERTUSSIS 5-2.5-18.5 LF-MCG/0.5 IM SUSP
0.5000 mL | Freq: Once | INTRAMUSCULAR | Status: AC
  Administered 2018-10-21: 01:00:00 0.5 mL via INTRAMUSCULAR
  Filled 2018-10-21: qty 0.5

## 2018-10-21 MED ORDER — ERYTHROMYCIN 5 MG/GM OP OINT: TOPICAL_OINTMENT | OPHTHALMIC | 0 refills | Status: DC

## 2018-10-21 MED ORDER — POTASSIUM CHLORIDE CRYS ER 20 MEQ PO TBCR
40.0000 meq | EXTENDED_RELEASE_TABLET | Freq: Once | ORAL | Status: AC
  Administered 2018-10-21: 01:00:00 40 meq via ORAL
  Filled 2018-10-21: qty 2

## 2018-10-21 MED ORDER — ACETAMINOPHEN 500 MG PO TABS: 1000.0000 mg | ORAL_TABLET | Freq: Once | ORAL | Status: DC

## 2018-10-21 MED ORDER — OXYCODONE-ACETAMINOPHEN 5-325 MG PO TABS: 1.0000 | ORAL_TABLET | Freq: Four times a day (QID) | ORAL | 0 refills | Status: DC | PRN

## 2018-10-21 MED ORDER — SODIUM CHLORIDE 0.9 % IV BOLUS
1000.0000 mL | Freq: Once | INTRAVENOUS | Status: AC
  Administered 2018-10-21: 1000 mL via INTRAVENOUS

## 2018-10-21 NOTE — ED Notes (Signed)
Pt. Alert at this time. Called wife with no answer. Called brother who will not be able to pick up till 6 am. Pt. States not feeling fully awake to take [employer]. PA Notified.

## 2018-10-21 NOTE — ED Notes (Signed)
Visual acuity screening complete, patient had a difficult time to stay awake during screening.

## 2018-10-21 NOTE — ED Notes (Signed)
Pt. Alert. PO liquid: water offered. Pt informed plan of care to stay awake and walk later. TV and lights on for stimulation.  Will continue to monitor pt ability to drink water.

## 2018-10-21 NOTE — ED Notes (Signed)
Pt. States unable to get rid. Pt. Still very tired.

## 2018-10-21 NOTE — Discharge Instructions (Addendum)
Please take Ibuprofen (Advil, motrin) and Tylenol (acetaminophen) to relieve your pain.  You may take up to 600 MG (3 pills) of normal strength ibuprofen every 8 hours as needed.  In between doses of ibuprofen you make take tylenol, up to 1,000 mg (two extra strength pills).  Do not take more than 3,000 mg tylenol in a 24 hour period.  Please check all medication labels as many medications such as pain and cold medications may contain tylenol.  Do not drink alcohol while taking these medications.  Do not take other NSAID'S while taking ibuprofen (such as aleve or naproxen).  Please take ibuprofen with food to decrease stomach upset. ° °Today you received medications that may make you sleepy or impair your ability to make decisions.  For the next 24 hours please do not drive, operate heavy machinery, care for a small child with out another adult present, or perform any activities that may cause harm to you or someone else if you were to fall asleep or be impaired.  ° °You are being prescribed a medication which may make you sleepy. Please follow up of listed precautions for at least 24 hours after taking one dose. ° °You may have diarrhea from the antibiotics.  It is very important that you continue to take the antibiotics even if you get diarrhea unless a medical professional tells you that you may stop taking them.  If you stop too early the bacteria you are being treated for will become stronger and you may need different, more powerful antibiotics that have more side effects and worsening diarrhea.  Please stay well hydrated and consider probiotics as they may decrease the severity of your diarrhea.   °

## 2018-10-21 NOTE — ED Notes (Signed)
Pt. Able to take medication. Pt. Continues to be very sleepy. Pt states he has been working all day and is feeling very tired. Pt. Continues to state having a headache.

## 2018-10-21 NOTE — ED Notes (Signed)
Pt. To attempt to call wife for pick up
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
## 2018-10-21 NOTE — ED Notes (Addendum)
Upon assessment pt. Very sleep requiring constant verbal commands. Pt. Reports headache denied vision changes/N/V. PA at bedside

## 2018-10-21 NOTE — ED Notes (Signed)
Discharge instructions, prescriptions, and follow up care discussed with pt. And wife via phone. Pt./wife have no questions at this time. Pt. Verbalized understanding. Pt to go home with brother.

## 2018-10-21 NOTE — ED Notes (Signed)
Pt. Continues to be very sleep. Pt. Has to been awaken several times throughout neuro assessment. Pt. Able to follow gaze. Pupils asymmetrical at this time. Pt able to see from left eye.

## 2018-10-27 ENCOUNTER — Other Ambulatory Visit: Payer: Self-pay | Admitting: Otolaryngology

## 2018-10-31 ENCOUNTER — Other Ambulatory Visit (HOSPITAL_COMMUNITY)
Admission: RE | Admit: 2018-10-31 | Discharge: 2018-10-31 | Disposition: A | Discharge status: Home / Self Care | Payer: HRSA Program | Source: Ambulatory Visit | Attending: Otolaryngology | Admitting: Otolaryngology

## 2018-10-31 DIAGNOSIS — Z1159 Encounter for screening for other viral diseases: Secondary | ICD-10-CM | POA: Diagnosis not present

## 2018-10-31 DIAGNOSIS — Z01812 Encounter for preprocedural laboratory examination: Secondary | ICD-10-CM | POA: Diagnosis not present

## 2018-10-31 LAB — SARS CORONAVIRUS 2 (TAT 6-24 HRS): SARS Coronavirus 2: NEGATIVE

## 2018-10-31 NOTE — Progress Notes (Signed)
[id number] [place], [place] - [address] AT [address] [place] [address] Phone: [phone] Fax: [phone]  [id number] [place],  - [address] AT [facility] [address] [place] [address] Phone: [phone] Fax: [phone]      Your procedure is scheduled on July 2  Report to [facility] Main Entrance "A" at 0730 A.M., and check in at the Admitting office.  Call this number if you have problems the morning of surgery:  [phone]  Call [phone] if you have any questions prior to your surgery date Monday-Friday 8am-4pm    Remember:  Do not eat after midnight.    Take these medicines the morning of surgery with A SIP OF WATER  acetaminophen (TYLENOL)  If needed amLODipine (NORVASC)  doxycycline (VIBRAMYCIN)  omeprazole (PRILOSEC) oxyCODONE-acetaminophen (PERCOCET/ROXICET) if needed sertraline (ZOLOFT)  7 days prior to surgery STOP taking any Aspirin (unless otherwise instructed by your surgeon), Aleve, Naproxen, Ibuprofen, Motrin, Advil, Goody's, BC's, all herbal medications, fish oil, and all vitamins.    The Morning of Surgery  Do not wear jewelry, make-up or nail polish.  Do not wear lotions, powders, or perfumes/colognes, or deodorant  Do not shave 48 hours prior to surgery.  Men may shave face and neck.  Do not bring valuables to the hospital.  [facility] is not responsible for any belongings or valuables.  If you are a smoker, DO NOT Smoke 24 hours prior to surgery IF you wear a CPAP at night please bring your mask, tubing, and machine the morning of surgery   Remember that you must have someone to transport you home after your surgery, and remain with you for 24 hours if you are discharged the same day.   Contacts, glasses, hearing aids, dentures or bridgework may not be worn into surgery.    Leave your suitcase in the car.   After surgery it may be brought to your room.  For patients admitted to the hospital, discharge time will be determined by your treatment team.  Patients discharged the day of surgery will not be allowed to drive home.    Special instructions:   [place]- Preparing For Surgery  Before surgery, you can play an important role. Because skin is not sterile, your skin needs to be as free of germs as possible. You can reduce the number of germs on your skin by washing with CHG (chlorahexidine gluconate) Soap before surgery.  CHG is an antiseptic cleaner which kills germs and bonds with the skin to continue killing germs even after washing.    Oral Hygiene is also important to reduce your risk of infection.  Remember - BRUSH YOUR TEETH THE MORNING OF SURGERY WITH YOUR REGULAR TOOTHPASTE  Please do not use if you have an allergy to CHG or antibacterial soaps. If your skin becomes reddened/irritated stop using the CHG.  Do not shave (including legs and underarms) for at least 48 hours prior to first CHG shower. It is OK to shave your face.  Please follow these instructions carefully.   1. Shower the NIGHT BEFORE SURGERY and the MORNING OF SURGERY with CHG Soap.   2. If you chose to wash your hair, wash your hair first as usual with your normal shampoo.  3. After you shampoo, rinse your hair and body thoroughly to remove the shampoo.  4. Use CHG as you would any  other liquid soap. You can apply CHG directly to the skin and wash gently with a scrungie or a clean washcloth.   5. Apply the CHG Soap to your body ONLY FROM THE NECK DOWN.  Do not use on open wounds or open sores. Avoid contact with your eyes, ears, mouth and genitals (private parts). Wash Face and genitals (private parts)  with your normal soap.   6. Wash thoroughly, paying special attention to the area where your surgery will be performed.  7. Thoroughly rinse your body with warm water from the neck down.  8. DO NOT shower/wash  with your normal soap after using and rinsing off the CHG Soap.  9. Pat yourself dry with a CLEAN TOWEL.  10. Wear CLEAN PAJAMAS to bed the night before surgery, wear comfortable clothes the morning of surgery  11. Place CLEAN SHEETS on your bed the night of your first shower and DO NOT SLEEP WITH PETS.    Day of Surgery:  Do not apply any deodorants/lotions.  Please wear clean clothes to the hospital/surgery center.   Remember to brush your teeth WITH YOUR REGULAR TOOTHPASTE.   Please read over the following fact sheets that you were given.

## 2018-11-01 ENCOUNTER — Encounter (HOSPITAL_COMMUNITY): Payer: Self-pay

## 2018-11-01 ENCOUNTER — Other Ambulatory Visit: Payer: Self-pay

## 2018-11-01 ENCOUNTER — Encounter (HOSPITAL_COMMUNITY)
Admission: RE | Admit: 2018-11-01 | Discharge: 2018-11-01 | Disposition: A | Discharge status: Home / Self Care | Payer: Self-pay | Source: Ambulatory Visit | Attending: Otolaryngology | Admitting: Otolaryngology

## 2018-11-01 DIAGNOSIS — I10 Essential (primary) hypertension: Secondary | ICD-10-CM | POA: Diagnosis not present

## 2018-11-01 DIAGNOSIS — S02842A Fracture of lateral orbital wall, left side, initial encounter for closed fracture: Secondary | ICD-10-CM | POA: Diagnosis not present

## 2018-11-01 DIAGNOSIS — Z791 Long term (current) use of non-steroidal anti-inflammatories (NSAID): Secondary | ICD-10-CM | POA: Diagnosis not present

## 2018-11-01 DIAGNOSIS — Z87442 Personal history of urinary calculi: Secondary | ICD-10-CM | POA: Diagnosis not present

## 2018-11-01 DIAGNOSIS — Z79899 Other long term (current) drug therapy: Secondary | ICD-10-CM | POA: Diagnosis not present

## 2018-11-01 DIAGNOSIS — Z880 Allergy status to penicillin: Secondary | ICD-10-CM | POA: Diagnosis not present

## 2018-11-01 DIAGNOSIS — S0240DA Maxillary fracture, left side, initial encounter for closed fracture: Secondary | ICD-10-CM | POA: Diagnosis not present

## 2018-11-01 DIAGNOSIS — F419 Anxiety disorder, unspecified: Secondary | ICD-10-CM | POA: Diagnosis not present

## 2018-11-01 DIAGNOSIS — S0240FA Zygomatic fracture, left side, initial encounter for closed fracture: Secondary | ICD-10-CM | POA: Diagnosis present

## 2018-11-01 DIAGNOSIS — F17200 Nicotine dependence, unspecified, uncomplicated: Secondary | ICD-10-CM | POA: Diagnosis not present

## 2018-11-01 LAB — CBC
HCT: 42.9 % (ref 39.0–52.0)
Hemoglobin: 14.4 g/dL (ref 13.0–17.0)
MCH: 31.8 pg (ref 26.0–34.0)
MCHC: 33.6 g/dL (ref 30.0–36.0)
MCV: 94.7 fL (ref 80.0–100.0)
Platelets: 254 K/uL (ref 150–400)
RBC: 4.53 MIL/uL (ref 4.22–5.81)
RDW: 16.4 % — ABNORMAL HIGH (ref 11.5–15.5)
WBC: 9.5 K/uL (ref 4.0–10.5)
nRBC: 0 % (ref 0.0–0.2)

## 2018-11-01 LAB — BASIC METABOLIC PANEL
Anion gap: 11 (ref 5–15)
BUN: 8 mg/dL (ref 6–20)
CO2: 23 mmol/L (ref 22–32)
Calcium: 9.3 mg/dL (ref 8.9–10.3)
Chloride: 107 mmol/L (ref 98–111)
Creatinine, Ser: 1.17 mg/dL (ref 0.61–1.24)
GFR calc Af Amer: >60 mL/min (ref >60)
GFR calc non Af Amer: >60 mL/min (ref >60)
Glucose, Bld: 92 mg/dL (ref 70–99)
Potassium: 3.3 mmol/L — ABNORMAL LOW (ref 3.5–5.1)
Sodium: 141 mmol/L (ref 135–145)

## 2018-11-01 NOTE — Progress Notes (Addendum)
PCP - denies Cardiologist -denies   Chest x-ray - not needed EKG - 09/17/18 Stress Test - denies ECHO - denies Cardiac Cath - denies   Anesthesia review: yes abnormal EKG Patients pressure elevated at pat appointment, patient stated that he did not take his Norvasc, patient left before recheck   Patient denies shortness of breath, fever, cough and chest pain at PAT appointment   Patient verbalized understanding of instructions that were given to them at the PAT appointment. Patient was also instructed that they will need to review over the PAT instructions again at home before surgery.

## 2018-11-03 ENCOUNTER — Other Ambulatory Visit: Payer: Self-pay

## 2018-11-03 ENCOUNTER — Ambulatory Visit (HOSPITAL_COMMUNITY): Payer: Self-pay | Admitting: Anesthesiology

## 2018-11-03 ENCOUNTER — Ambulatory Visit (HOSPITAL_COMMUNITY): Payer: Self-pay | Admitting: Physician Assistant

## 2018-11-03 ENCOUNTER — Encounter (HOSPITAL_COMMUNITY)
Admission: RE | Disposition: A | Discharge status: Home / Self Care | Payer: Self-pay | Source: Home / Self Care | Attending: Otolaryngology

## 2018-11-03 ENCOUNTER — Encounter (HOSPITAL_COMMUNITY): Payer: Self-pay | Admitting: Surgery

## 2018-11-03 ENCOUNTER — Ambulatory Visit (HOSPITAL_COMMUNITY)
Admission: RE | Admit: 2018-11-03 | Discharge: 2018-11-03 | Disposition: A | Discharge status: Home / Self Care | Payer: Self-pay | Attending: Otolaryngology | Admitting: Otolaryngology

## 2018-11-03 DIAGNOSIS — Z79899 Other long term (current) drug therapy: Secondary | ICD-10-CM | POA: Insufficient documentation

## 2018-11-03 DIAGNOSIS — S0240FA Zygomatic fracture, left side, initial encounter for closed fracture: Secondary | ICD-10-CM | POA: Diagnosis not present

## 2018-11-03 DIAGNOSIS — F419 Anxiety disorder, unspecified: Secondary | ICD-10-CM | POA: Insufficient documentation

## 2018-11-03 DIAGNOSIS — F17200 Nicotine dependence, unspecified, uncomplicated: Secondary | ICD-10-CM | POA: Insufficient documentation

## 2018-11-03 DIAGNOSIS — Z87442 Personal history of urinary calculi: Secondary | ICD-10-CM | POA: Insufficient documentation

## 2018-11-03 DIAGNOSIS — Z880 Allergy status to penicillin: Secondary | ICD-10-CM | POA: Insufficient documentation

## 2018-11-03 DIAGNOSIS — Z791 Long term (current) use of non-steroidal anti-inflammatories (NSAID): Secondary | ICD-10-CM | POA: Insufficient documentation

## 2018-11-03 DIAGNOSIS — S02842A Fracture of lateral orbital wall, left side, initial encounter for closed fracture: Secondary | ICD-10-CM | POA: Insufficient documentation

## 2018-11-03 DIAGNOSIS — I10 Essential (primary) hypertension: Secondary | ICD-10-CM | POA: Insufficient documentation

## 2018-11-03 DIAGNOSIS — S0240DA Maxillary fracture, left side, initial encounter for closed fracture: Secondary | ICD-10-CM | POA: Insufficient documentation

## 2018-11-03 HISTORY — PX: ORIF TRIPOD FRACTURE: SHX5211

## 2018-11-03 SURGERY — OPEN REDUCTION INTERNAL FIXATION (ORIF) TRIPOD FRACTURE
Anesthesia: General | Site: Face

## 2018-11-03 MED ORDER — LIDOCAINE 2% (20 MG/ML) 5 ML SYRINGE
INTRAMUSCULAR | Status: AC
  Filled 2018-11-03: qty 5

## 2018-11-03 MED ORDER — DEXAMETHASONE SODIUM PHOSPHATE 10 MG/ML IJ SOLN
INTRAMUSCULAR | Status: DC | PRN
  Administered 2018-11-03: 5 mg via INTRAVENOUS

## 2018-11-03 MED ORDER — HYDRALAZINE HCL 20 MG/ML IJ SOLN
INTRAMUSCULAR | Status: AC
  Filled 2018-11-03: qty 1

## 2018-11-03 MED ORDER — LACTATED RINGERS IV SOLN
INTRAVENOUS | Status: DC | PRN
  Administered 2018-11-03 (×2): via INTRAVENOUS

## 2018-11-03 MED ORDER — ONDANSETRON HCL 4 MG/2ML IJ SOLN
INTRAMUSCULAR | Status: DC | PRN
  Administered 2018-11-03: 4 mg via INTRAVENOUS

## 2018-11-03 MED ORDER — OXYMETAZOLINE HCL 0.05 % NA SOLN
NASAL | Status: AC
  Filled 2018-11-03: qty 30

## 2018-11-03 MED ORDER — HYDRALAZINE HCL 20 MG/ML IJ SOLN
5.0000 mg | Freq: Once | INTRAMUSCULAR | Status: AC
  Administered 2018-11-03: 5 mg via INTRAVENOUS

## 2018-11-03 MED ORDER — CHLORHEXIDINE GLUCONATE CLOTH 2 % EX PADS: 6.0000 | MEDICATED_PAD | Freq: Once | CUTANEOUS | Status: DC

## 2018-11-03 MED ORDER — BACITRACIN-NEOMYCIN-POLYMYXIN 400-5-5000 EX OINT
TOPICAL_OINTMENT | CUTANEOUS | Status: DC | PRN
  Administered 2018-11-03: 1 via TOPICAL

## 2018-11-03 MED ORDER — FENTANYL CITRATE (PF) 100 MCG/2ML IJ SOLN
INTRAMUSCULAR | Status: DC | PRN
  Administered 2018-11-03 (×2): 100 ug via INTRAVENOUS
  Administered 2018-11-03: 50 ug via INTRAVENOUS

## 2018-11-03 MED ORDER — FENTANYL CITRATE (PF) 250 MCG/5ML IJ SOLN
INTRAMUSCULAR | Status: AC
  Filled 2018-11-03: qty 5

## 2018-11-03 MED ORDER — GELATIN ADSORBABLE OP FILM
ORAL_FILM | OPHTHALMIC | Status: AC
  Filled 2018-11-03: qty 1

## 2018-11-03 MED ORDER — MIDAZOLAM HCL 5 MG/5ML IJ SOLN
INTRAMUSCULAR | Status: DC | PRN
  Administered 2018-11-03: 2 mg via INTRAVENOUS

## 2018-11-03 MED ORDER — HYDRALAZINE HCL 20 MG/ML IJ SOLN
INTRAMUSCULAR | Status: AC
  Administered 2018-11-03: 09:00:00 5 mg via INTRAVENOUS
  Filled 2018-11-03: qty 1

## 2018-11-03 MED ORDER — ARTIFICIAL TEARS OPHTHALMIC OINT
TOPICAL_OINTMENT | OPHTHALMIC | Status: DC | PRN
  Administered 2018-11-03: 1 via OPHTHALMIC

## 2018-11-03 MED ORDER — LIDOCAINE-EPINEPHRINE 1 %-1:100000 IJ SOLN
INTRAMUSCULAR | Status: DC | PRN
  Administered 2018-11-03: 1 mL

## 2018-11-03 MED ORDER — HYDROMORPHONE HCL 1 MG/ML IJ SOLN
0.2500 mg | INTRAMUSCULAR | Status: DC | PRN
  Administered 2018-11-03: 0.5 mg via INTRAVENOUS

## 2018-11-03 MED ORDER — MIDAZOLAM HCL 2 MG/2ML IJ SOLN
INTRAMUSCULAR | Status: AC
  Filled 2018-11-03: qty 2

## 2018-11-03 MED ORDER — HYDROMORPHONE HCL 1 MG/ML IJ SOLN
INTRAMUSCULAR | Status: AC
  Filled 2018-11-03: qty 1

## 2018-11-03 MED ORDER — HYDRALAZINE HCL 20 MG/ML IJ SOLN
INTRAMUSCULAR | Status: DC | PRN
  Administered 2018-11-03: 10 mg via INTRAVENOUS

## 2018-11-03 MED ORDER — LIDOCAINE 2% (20 MG/ML) 5 ML SYRINGE
INTRAMUSCULAR | Status: DC | PRN
  Administered 2018-11-03: 60 mg via INTRAVENOUS

## 2018-11-03 MED ORDER — EPINEPHRINE HCL (NASAL) 0.1 % NA SOLN
NASAL | Status: AC
  Filled 2018-11-03: qty 30

## 2018-11-03 MED ORDER — DOUBLE ANTIBIOTIC 500-10000 UNIT/GM EX OINT
TOPICAL_OINTMENT | CUTANEOUS | Status: AC
  Filled 2018-11-03: qty 1

## 2018-11-03 MED ORDER — OXYMETAZOLINE HCL 0.05 % NA SOLN
NASAL | Status: DC | PRN
  Administered 2018-11-03: 1

## 2018-11-03 MED ORDER — SUCCINYLCHOLINE CHLORIDE 200 MG/10ML IV SOSY
PREFILLED_SYRINGE | INTRAVENOUS | Status: DC | PRN
  Administered 2018-11-03: 120 mg via INTRAVENOUS

## 2018-11-03 MED ORDER — ARTIFICIAL TEARS OPHTHALMIC OINT
TOPICAL_OINTMENT | OPHTHALMIC | Status: AC
  Filled 2018-11-03: qty 3.5

## 2018-11-03 MED ORDER — PROPOFOL 10 MG/ML IV BOLUS
INTRAVENOUS | Status: AC
  Filled 2018-11-03: qty 20

## 2018-11-03 MED ORDER — LABETALOL HCL 5 MG/ML IV SOLN
INTRAVENOUS | Status: AC
  Filled 2018-11-03: qty 4

## 2018-11-03 MED ORDER — LABETALOL HCL 5 MG/ML IV SOLN
INTRAVENOUS | Status: DC | PRN
  Administered 2018-11-03 (×2): 10 mg via INTRAVENOUS

## 2018-11-03 MED ORDER — ACETAMINOPHEN 500 MG PO TABS
1000.0000 mg | ORAL_TABLET | Freq: Once | ORAL | Status: AC
  Administered 2018-11-03: 08:00:00 1000 mg via ORAL
  Filled 2018-11-03: qty 2

## 2018-11-03 MED ORDER — HYDRALAZINE HCL 20 MG/ML IJ SOLN
5.0000 mg | Freq: Once | INTRAMUSCULAR | Status: AC
  Administered 2018-11-03: 5 mg via INTRAVENOUS
  Filled 2018-11-03: qty 0.25

## 2018-11-03 MED ORDER — LIDOCAINE-EPINEPHRINE 1 %-1:100000 IJ SOLN
INTRAMUSCULAR | Status: AC
  Filled 2018-11-03: qty 1

## 2018-11-03 MED ORDER — PROPOFOL 10 MG/ML IV BOLUS
INTRAVENOUS | Status: DC | PRN
  Administered 2018-11-03: 200 mg via INTRAVENOUS

## 2018-11-03 SURGICAL SUPPLY — 51 items
BIT DRILL TWIST 1.3X5 (BIT) ×1
BIT DRILL TWIST 1.3X5MM (BIT) IMPLANT
BIT DRILL UPPR FCE 0.9M 4 TWST (BIT) IMPLANT
BLADE SURG 10 STRL SS (BLADE) ×3 IMPLANT
BLADE SURG 15 STRL LF DISP TIS (BLADE) IMPLANT
BLADE SURG 15 STRL SS (BLADE)
CANISTER SUCT 3000ML PPV (MISCELLANEOUS) ×3 IMPLANT
CLEANER TIP ELECTROSURG 2X2 (MISCELLANEOUS) ×3 IMPLANT
CONFORMER OPHTHALMIC LG W/HOLE (MISCELLANEOUS) ×2 IMPLANT
COVER SURGICAL LIGHT HANDLE (MISCELLANEOUS) ×3 IMPLANT
COVER WAND RF STERILE (DRAPES) ×1 IMPLANT
DECANTER SPIKE VIAL GLASS SM (MISCELLANEOUS) ×3 IMPLANT
DRAPE HALF SHEET 40X57 (DRAPES) IMPLANT
DRILL BIT TWIST 1.3X5MM (BIT) ×2
DRILL UPPERFACE 0.9M 4MM TWIST (BIT) ×3
ELECT COATED BLADE 2.86 ST (ELECTRODE) ×3 IMPLANT
ELECT NDL BLADE 2-5/6 (NEEDLE) IMPLANT
ELECT NEEDLE BLADE 2-5/6 (NEEDLE) IMPLANT
ELECT REM PT RETURN 9FT ADLT (ELECTROSURGICAL) ×3
ELECTRODE REM PT RTRN 9FT ADLT (ELECTROSURGICAL) ×1 IMPLANT
GLOVE ECLIPSE 7.5 STRL STRAW (GLOVE) ×3 IMPLANT
GOWN STRL REUS W/ TWL LRG LVL3 (GOWN DISPOSABLE) ×2 IMPLANT
GOWN STRL REUS W/TWL LRG LVL3 (GOWN DISPOSABLE) ×4
KIT BASIN OR (CUSTOM PROCEDURE TRAY) ×3 IMPLANT
KIT TURNOVER KIT B (KITS) ×3 IMPLANT
NDL HYPO 25GX1X1/2 BEV (NEEDLE) IMPLANT
NEEDLE HYPO 25GX1X1/2 BEV (NEEDLE) ×3 IMPLANT
NS IRRIG 1000ML POUR BTL (IV SOLUTION) ×3 IMPLANT
PAD ARMBOARD 7.5X6 YLW CONV (MISCELLANEOUS) ×4 IMPLANT
PATTIES SURGICAL .5 X3 (DISPOSABLE) IMPLANT
PENCIL FOOT CONTROL (ELECTRODE) ×3 IMPLANT
PLATE UPPER FACE .6X10H CURV (Plate) IMPLANT
PLATE UPPER FACE 10H CURVED (Plate) ×2 IMPLANT
POSITIONER HEAD DONUT 9IN (MISCELLANEOUS) ×2 IMPLANT
SCREW MID FACE 1.7X8MM SLF TAP (Screw) ×2 IMPLANT
SCREW UPPERFACE 1.2X5M SL (Screw) ×12 IMPLANT
SUT CHROMIC 3 0 PS 2 (SUTURE) ×1 IMPLANT
SUT CHROMIC 4 0 P 3 18 (SUTURE) ×2 IMPLANT
SUT CHROMIC 5 0 P 3 (SUTURE) ×1 IMPLANT
SUT ETHILON 5 0 P 3 18 (SUTURE)
SUT ETHILON 5 0 PS 2 18 (SUTURE) ×2 IMPLANT
SUT ETHILON 6 0 P 1 (SUTURE) ×1 IMPLANT
SUT NOVAFIL 6 0 PRE 2 4412 13 (SUTURE) ×1 IMPLANT
SUT NYLON ETHILON 5-0 P-3 1X18 (SUTURE) ×1 IMPLANT
SUT SILK 2 0 PERMA HAND 18 BK (SUTURE) ×1 IMPLANT
SUT STEEL 0 (SUTURE)
SUT STEEL 0 18XMFL TIE 17 (SUTURE) IMPLANT
SUT STEEL 2 (SUTURE) IMPLANT
TOWEL GREEN STERILE FF (TOWEL DISPOSABLE) ×3 IMPLANT
TRAY ENT MC OR (CUSTOM PROCEDURE TRAY) ×3 IMPLANT
WATER STERILE IRR 1000ML POUR (IV SOLUTION) ×1 IMPLANT
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
## 2018-11-03 NOTE — Anesthesia Postprocedure Evaluation (Signed)
Anesthesia Post Note  Patient: Derrick Hoffman  Procedure(s) Performed: OPEN REDUCTION INTERNAL FIXATION (ORIF) TRIPOD FRACTURE (N/A Face)     Patient location during evaluation: PACU Anesthesia Type: General Level of consciousness: awake and alert Pain management: pain level controlled Vital Signs Assessment: post-procedure vital signs reviewed and stable Respiratory status: spontaneous breathing, nonlabored ventilation and respiratory function stable Cardiovascular status: blood pressure returned to baseline and stable Postop Assessment: no apparent nausea or vomiting Anesthetic complications: no    Last Vitals:  Vitals:   11/03/18 1145 11/03/18 1152  BP:  138/75  Pulse: 81 86  Resp: 15 (!) 22  Temp:  (!) 36.3 C  SpO2: 97% 96%    Last Pain:  Vitals:   11/03/18 1130  TempSrc:   PainSc: 0-No pain                 Aydian Dimmick,W. EDMOND

## 2018-11-03 NOTE — Anesthesia Procedure Notes (Signed)
Procedure Name: Intubation Date/Time: 11/03/2018 9:57 AM Performed by: Moshe Salisbury, CRNA Pre-anesthesia Checklist: Patient identified, Emergency Drugs available, Suction available and Patient being monitored Patient Re-evaluated:Patient Re-evaluated prior to induction Oxygen Delivery Method: Circle System Utilized Preoxygenation: Pre-oxygenation with 100% oxygen Induction Type: IV induction Ventilation: Mask ventilation without difficulty Laryngoscope Size: Mac and 4 Grade View: Grade I Tube type: Oral Tube size: 8.0 mm Number of attempts: 1 Airway Equipment and Method: Stylet Placement Confirmation: ETT inserted through vocal cords under direct vision,  positive ETCO2 and breath sounds checked- equal and bilateral Secured at: 24 cm Tube secured with: Tape Dental Injury: Teeth and Oropharynx as per pre-operative assessment

## 2018-11-03 NOTE — Transfer of Care (Signed)
Immediate Anesthesia Transfer of Care Note  Patient: Derrick Hoffman  Procedure(s) Performed: OPEN REDUCTION INTERNAL FIXATION (ORIF) TRIPOD FRACTURE (N/A Face)  Patient Location: PACU  Anesthesia Type:General  Level of Consciousness: drowsy and patient cooperative  Airway & Oxygen Therapy: Patient Spontanous Breathing and Patient connected to nasal cannula oxygen  Post-op Assessment: Report given to RN, Post -op Vital signs reviewed and stable and Patient moving all extremities  Post vital signs: Reviewed and stable  Last Vitals:  Vitals Value Taken Time  BP 119/84 11/03/18 1106  Temp 36.5 C 11/03/18 1105  Pulse 67 11/03/18 1107  Resp 27 11/03/18 1107  SpO2 96 % 11/03/18 1107  Vitals shown include unvalidated device data.  Last Pain:  Vitals:   11/03/18 0746  TempSrc:   PainSc: 7       Patients Stated Pain Goal: 3 (84/66/59 9357)  Complications: No apparent anesthesia complications

## 2018-11-03 NOTE — Anesthesia Preprocedure Evaluation (Addendum)
Anesthesia Evaluation  Patient identified by MRN, date of birth, ID band Patient awake    Reviewed: Allergy & Precautions, H&P , NPO status , Patient's Chart, lab work & pertinent test results  Airway Mallampati: III  TM Distance: >3 FB Neck ROM: Full    Dental no notable dental hx. (+) Teeth Intact, Dental Advisory Given   Pulmonary asthma , Current Smoker,    Pulmonary exam normal breath sounds clear to auscultation       Cardiovascular hypertension, Pt. on medications  Rhythm:Regular Rate:Normal     Neuro/Psych Anxiety negative neurological ROS     GI/Hepatic negative GI ROS, Neg liver ROS,   Endo/Other  negative endocrine ROS  Renal/GU negative Renal ROS  negative genitourinary   Musculoskeletal   Abdominal   Peds  Hematology negative hematology ROS (+)   Anesthesia Other Findings   Reproductive/Obstetrics negative OB ROS                            Anesthesia Physical Anesthesia Plan  ASA: II  Anesthesia Plan: General   Post-op Pain Management:    Induction: Intravenous  PONV Risk Score and Plan: 2 and Ondansetron, Dexamethasone and Midazolam  Airway Management Planned: Oral ETT  Additional Equipment:   Intra-op Plan:   Post-operative Plan: Extubation in OR  Informed Consent: I have reviewed the patients History and Physical, chart, labs and discussed the procedure including the risks, benefits and alternatives for the proposed anesthesia with the patient or authorized representative who has indicated his/her understanding and acceptance.     Dental advisory given  Plan Discussed with: CRNA  Anesthesia Plan Comments:         Anesthesia Quick Evaluation
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
## 2018-11-07 ENCOUNTER — Encounter (HOSPITAL_COMMUNITY): Payer: Self-pay | Admitting: Otolaryngology

## 2018-11-20 ENCOUNTER — Telehealth: Payer: No Typology Code available for payment source | Admitting: Nurse Practitioner

## 2018-11-20 DIAGNOSIS — L03317 Cellulitis of buttock: Secondary | ICD-10-CM

## 2018-11-20 DIAGNOSIS — L0231 Cutaneous abscess of buttock: Secondary | ICD-10-CM

## 2018-11-20 MED ORDER — CEPHALEXIN 500 MG PO CAPS: 500.0000 mg | ORAL_CAPSULE | Freq: Four times a day (QID) | ORAL | 0 refills | Status: DC

## 2018-11-20 NOTE — Progress Notes (Addendum)
E Visit for Cellulitis  We are sorry that you are not feeling well. Here is how we plan to help!  Based on what you shared with me it looks like you have cellulitis.  Cellulitis looks like areas of skin redness, swelling, and warmth; it develops as a result of bacteria entering under the skin. Little red spots and/or bleeding can be seen in skin, and tiny surface sacs containing fluid can occur. Fever can be present. Cellulitis is almost always on one side of a body, and the lower limbs are the most common site of involvement.   I have prescribed:  Keflex 500mg orally, 4 times a day for 5 days  HOME CARE:  . Take your medications as ordered and take all of them, even if the skin irritation appears to be healing.   GET HELP RIGHT AWAY IF:  . Symptoms that don't begin to go away within 48 hours. . Severe redness persists or worsens . If the area turns color, spreads or swells. . If it blisters and opens, develops yellow-brown crust or bleeds. . You develop a fever or chills. . If the pain increases or becomes unbearable.  . Are unable to keep fluids and food down.  MAKE SURE YOU    Understand these instructions.  Will watch your condition.  Will get help right away if you are not doing well or get worse.  Thank you for choosing an e-visit. Your e-visit answers were reviewed by a board certified advanced clinical practitioner to complete your personal care plan. Depending upon the condition, your plan could have included both over the counter or prescription medications. Please review your pharmacy choice. Make sure the pharmacy is open so you can pick up prescription now. If there is a problem, you may contact your provider through MyChart messaging and have the prescription routed to another pharmacy. Your safety is important to us. If you have drug allergies check your prescription carefully.  For the next 24 hours you can use MyChart to ask questions about today's visit, request a  non-urgent call back, or ask for a work or school excuse. You will get an email in the next two days asking about your experience. I hope that your e-visit has been valuable and will speed your recovery.  5-10 minutes spent reviewing and documenting in chart.  

## 2019-05-25 ENCOUNTER — Other Ambulatory Visit: Payer: Self-pay

## 2019-05-25 ENCOUNTER — Emergency Department (HOSPITAL_COMMUNITY): Payer: Self-pay

## 2019-05-25 ENCOUNTER — Emergency Department (HOSPITAL_COMMUNITY)
Admission: EM | Admit: 2019-05-25 | Discharge: 2019-05-25 | Disposition: A | Discharge status: Home / Self Care | Payer: Self-pay | Attending: Emergency Medicine | Admitting: Emergency Medicine

## 2019-05-25 ENCOUNTER — Encounter (HOSPITAL_COMMUNITY): Payer: Self-pay

## 2019-05-25 DIAGNOSIS — R7989 Other specified abnormal findings of blood chemistry: Secondary | ICD-10-CM

## 2019-05-25 DIAGNOSIS — F17210 Nicotine dependence, cigarettes, uncomplicated: Secondary | ICD-10-CM | POA: Insufficient documentation

## 2019-05-25 DIAGNOSIS — R945 Abnormal results of liver function studies: Secondary | ICD-10-CM | POA: Insufficient documentation

## 2019-05-25 DIAGNOSIS — B349 Viral infection, unspecified: Secondary | ICD-10-CM

## 2019-05-25 DIAGNOSIS — R030 Elevated blood-pressure reading, without diagnosis of hypertension: Secondary | ICD-10-CM

## 2019-05-25 DIAGNOSIS — Z20822 Contact with and (suspected) exposure to covid-19: Secondary | ICD-10-CM

## 2019-05-25 DIAGNOSIS — I10 Essential (primary) hypertension: Secondary | ICD-10-CM | POA: Insufficient documentation

## 2019-05-25 DIAGNOSIS — R112 Nausea with vomiting, unspecified: Secondary | ICD-10-CM

## 2019-05-25 DIAGNOSIS — K769 Liver disease, unspecified: Secondary | ICD-10-CM

## 2019-05-25 DIAGNOSIS — Z79899 Other long term (current) drug therapy: Secondary | ICD-10-CM | POA: Insufficient documentation

## 2019-05-25 LAB — HEPATITIS PANEL, ACUTE
HCV Ab: NONREACTIVE
Hep A IgM: NONREACTIVE
Hep B C IgM: NONREACTIVE
Hepatitis B Surface Ag: NONREACTIVE

## 2019-05-25 LAB — COMPREHENSIVE METABOLIC PANEL
ALT: 317 U/L — ABNORMAL HIGH (ref 0–44)
AST: 270 U/L — ABNORMAL HIGH (ref 15–41)
Albumin: 4.8 g/dL (ref 3.5–5.0)
Alkaline Phosphatase: 97 U/L (ref 38–126)
Anion gap: 13 (ref 5–15)
BUN: 13 mg/dL (ref 6–20)
CO2: 26 mmol/L (ref 22–32)
Calcium: 9.6 mg/dL (ref 8.9–10.3)
Chloride: 101 mmol/L (ref 98–111)
Creatinine, Ser: 0.89 mg/dL (ref 0.61–1.24)
GFR calc Af Amer: >60 mL/min (ref >60)
GFR calc non Af Amer: >60 mL/min (ref >60)
Glucose, Bld: 109 mg/dL — ABNORMAL HIGH (ref 70–99)
Potassium: 3.1 mmol/L — ABNORMAL LOW (ref 3.5–5.1)
Sodium: 140 mmol/L (ref 135–145)
Total Bilirubin: 2.6 mg/dL — ABNORMAL HIGH (ref 0.3–1.2)
Total Protein: 8.7 g/dL — ABNORMAL HIGH (ref 6.5–8.1)

## 2019-05-25 LAB — URINALYSIS, ROUTINE W REFLEX MICROSCOPIC
Bacteria, UA: NONE SEEN
Bilirubin Urine: NEGATIVE
Glucose, UA: NEGATIVE mg/dL
Hgb urine dipstick: NEGATIVE
Ketones, ur: 5 mg/dL — AB
Leukocytes,Ua: NEGATIVE
Nitrite: NEGATIVE
Protein, ur: >=300 mg/dL — AB
Specific Gravity, Urine: 1.03 (ref 1.005–1.030)
pH: 6 (ref 5.0–8.0)

## 2019-05-25 LAB — POC SARS CORONAVIRUS 2 AG -  ED: SARS Coronavirus 2 Ag: NEGATIVE

## 2019-05-25 LAB — CBC WITH DIFFERENTIAL/PLATELET
Abs Immature Granulocytes: 0.04 10*3/uL (ref 0.00–0.07)
Basophils Absolute: 0.1 10*3/uL (ref 0.0–0.1)
Basophils Relative: 0 %
Eosinophils Absolute: 0 10*3/uL (ref 0.0–0.5)
Eosinophils Relative: 0 %
HCT: 46.2 % (ref 39.0–52.0)
Hemoglobin: 15.6 g/dL (ref 13.0–17.0)
Immature Granulocytes: 0 %
Lymphocytes Relative: 17 %
Lymphs Abs: 2.1 10*3/uL (ref 0.7–4.0)
MCH: 33.1 pg (ref 26.0–34.0)
MCHC: 33.8 g/dL (ref 30.0–36.0)
MCV: 97.9 fL (ref 80.0–100.0)
Monocytes Absolute: 1 10*3/uL (ref 0.1–1.0)
Monocytes Relative: 8 %
Neutro Abs: 9.2 10*3/uL — ABNORMAL HIGH (ref 1.7–7.7)
Neutrophils Relative %: 75 %
Platelets: 247 10*3/uL (ref 150–400)
RBC: 4.72 MIL/uL (ref 4.22–5.81)
RDW: 14.6 % (ref 11.5–15.5)
WBC: 12.3 10*3/uL — ABNORMAL HIGH (ref 4.0–10.5)
nRBC: 0 % (ref 0.0–0.2)

## 2019-05-25 LAB — ETHANOL: Alcohol, Ethyl (B): <10 mg/dL (ref <10)

## 2019-05-25 LAB — LIPASE, BLOOD: Lipase: 90 U/L — ABNORMAL HIGH (ref 11–51)

## 2019-05-25 LAB — ACETAMINOPHEN LEVEL: Acetaminophen (Tylenol), Serum: <10 ug/mL — ABNORMAL LOW (ref 10–30)

## 2019-05-25 LAB — RAPID HIV SCREEN (HIV 1/2 AB+AG)
HIV 1/2 Antibodies: NONREACTIVE
HIV-1 P24 Antigen - HIV24: NONREACTIVE

## 2019-05-25 MED ORDER — IOHEXOL 300 MG/ML  SOLN
100.0000 mL | Freq: Once | INTRAMUSCULAR | Status: AC | PRN
  Administered 2019-05-25: 100 mL via INTRAVENOUS

## 2019-05-25 MED ORDER — POTASSIUM CHLORIDE ER 10 MEQ PO TBCR: 20.0000 meq | EXTENDED_RELEASE_TABLET | Freq: Every day | ORAL | 0 refills | Status: DC

## 2019-05-25 MED ORDER — SODIUM CHLORIDE 0.9 % IV BOLUS
1000.0000 mL | Freq: Once | INTRAVENOUS | Status: AC
  Administered 2019-05-25: 14:00:00 1000 mL via INTRAVENOUS

## 2019-05-25 MED ORDER — ONDANSETRON 4 MG PO TBDP: 4.0000 mg | ORAL_TABLET | Freq: Three times a day (TID) | ORAL | 0 refills | Status: DC | PRN

## 2019-05-25 MED ORDER — FAMOTIDINE IN NACL 20-0.9 MG/50ML-% IV SOLN
20.0000 mg | Freq: Once | INTRAVENOUS | Status: AC
  Administered 2019-05-25: 20 mg via INTRAVENOUS
  Filled 2019-05-25: qty 50

## 2019-05-25 MED ORDER — ALUM & MAG HYDROXIDE-SIMETH 200-200-20 MG/5ML PO SUSP
30.0000 mL | Freq: Once | ORAL | Status: AC
  Administered 2019-05-25: 14:00:00 30 mL via ORAL
  Filled 2019-05-25: qty 30

## 2019-05-25 MED ORDER — LIDOCAINE VISCOUS HCL 2 % MT SOLN
15.0000 mL | Freq: Once | OROMUCOSAL | Status: AC
  Administered 2019-05-25: 14:00:00 15 mL via ORAL
  Filled 2019-05-25: qty 15

## 2019-05-25 MED ORDER — ONDANSETRON HCL 4 MG/2ML IJ SOLN
4.0000 mg | Freq: Once | INTRAMUSCULAR | Status: AC
  Administered 2019-05-25: 14:00:00 4 mg via INTRAVENOUS
  Filled 2019-05-25: qty 2

## 2019-05-25 NOTE — ED Provider Notes (Signed)
Care handoff received from Derrick Common, PA-C at shift change please see her note for full details of visit.  In short 44 year old male history of alcohol abuse, marijuana use, kidney stones, appendectomy, hypertension.  Here for treatment of abdominal pain, nausea, emesis, fever/chills and diarrhea beginning 4 days ago.  CBC mild leukocytosis of 12.3 with left shift CMP with AST 270, ALT 317, bilirubin 2.6 Lipase 90 Rapid Covid negative - EtOH, hepatitis panel, Tylenol level, HIDA and CT abdomen pelvis currently pending.  Plan of care is to follow-up on remainder of work-up reassess and disposition. Physical Exam  BP (!) 160/116 (BP Location: Left Arm)   Pulse 100   Temp 98.4 F (36.9 C) (Oral)   Resp 14   SpO2 97%   Physical Exam Constitutional:      General: He is not in acute distress.    Appearance: Normal appearance. He is well-developed. He is not ill-appearing or diaphoretic.  HENT:     Head: Normocephalic and atraumatic.     Right Ear: External ear normal.     Left Ear: External ear normal.     Nose: Nose normal.  Eyes:     General: Vision grossly intact. Gaze aligned appropriately.     Pupils: Pupils are equal, round, and reactive to light.  Neck:     Trachea: Trachea and phonation normal. No tracheal deviation.  Pulmonary:     Effort: Pulmonary effort is normal. No respiratory distress.  Abdominal:     General: There is no distension.     Palpations: Abdomen is soft.     Tenderness: There is no abdominal tenderness. There is no guarding or rebound.  Musculoskeletal:        General: Normal range of motion.     Cervical back: Normal range of motion.  Skin:    General: Skin is warm and dry.  Neurological:     Mental Status: He is alert.     GCS: GCS eye subscore is 4. GCS verbal subscore is 5. GCS motor subscore is 6.     Comments: Speech is clear and goal oriented, follows commands Major Cranial nerves without deficit, no facial droop Moves extremities without  ataxia, coordination intact  Psychiatric:        Behavior: Behavior normal.     ED Course/Procedures   Clinical Course as of May 24 1918  Thu May 25, 2019  1510 WBC(!): 12.3 [CG]  1510 Pulse Rate(!): 116 [CG]  1510 Lipase(!): 90 [CG]  1511 Potassium(!): 3.1 [CG]  1511 AST(!): 270 [CG]  1511 ALT(!): 317 [CG]  1511 Total Bilirubin(!): 2.6 [CG]    Clinical Course User Index [CG] Derrick Handy, PA-C    Procedures  MDM  Rapid HIV negative Tylenol level negative Ethanol level negative Urinalysis shows protein and ketones, suspect dehydration CT abdomen pelvis:  IMPRESSION:  1. Indistinct partially visualized focus of patchy consolidation and  ground-glass opacity in the right lower lobe, new from prior CT  abdomen study, cannot exclude pneumonia, including atypical/viral  etiologies (such as COVID-19).  2. Diffuse hepatic steatosis. No overt morphologic changes of  hepatic cirrhosis. No suspicious liver masses. Tiny sub 5 mm right  liver dome low-attenuation lesion can be followed up with MRI  abdomen in 3-6 months if the patient has risk factors for liver  malignancy. This recommendation follows ACR consensus guidelines:  Managing Incidental Findings on Abdominal CT: White Paper of the ACR  Incidental Findings Committee. J Am Coll Radiol 2010;7:754-773  3.  No CT findings of acute pancreatitis.  4. Aortic Atherosclerosis (ICD10-I70.0).   ----------------------------- On reevaluation patient resting comfortably using his phone no acute distress.  Suspect patient's history of fever/chills, GI symptoms and elevation of LFTs may be secondary to viral illness, hepatitis versus COVID-19.  Hepatitis panel is pending.  Given partially visualized groundglass opacities in the lungs concern that this may be due to COVID-19 viral infection.  Send out Covid test is currently in process.  On reexamination his abdomen is soft nontender without peritoneal signs, he has no respiratory  complaints and vital signs are stable on room air.  There is no indication for admission or further work-up at this time.  Patient is aware to follow-up on his Covid test on his MyChart account and discussed them with his primary care provider at his follow-up visit.  Additionally patient noted to have elevated blood pressure reading today.  He is asymptomatic regarding his elevated blood pressure, encouraged to follow-up with primary care provider within 1 week for blood pressure recheck and medication management.  Patient informed of signs/symptoms of hypertensive urgency/emergency and to return to the ED if they occur.  At this time there does not appear to be any evidence of an acute emergency medical condition and the patient appears stable for discharge with appropriate outpatient follow up. Diagnosis was discussed with patient who verbalizes understanding of care plan and is agreeable to discharge. I have discussed return precautions with patient who verbalizes understanding of return precautions. Patient encouraged to follow-up with their PCP. All questions answered.  Patient's case discussed with Dr. Rogene Houston who agrees with plan to discharge with follow-up.   Derrick Hoffman was evaluated in Emergency Department on 05/25/2019 for the symptoms described in the history of present illness. He was evaluated in the context of the global COVID-19 pandemic, which necessitated consideration that the patient might be at risk for infection with the SARS-CoV-2 virus that causes COVID-19. Institutional protocols and algorithms that pertain to the evaluation of patients at risk for COVID-19 are in a state of rapid change based on information released by regulatory bodies including the CDC and federal and state organizations. These policies and algorithms were followed during the patient's care in the ED.  Note: Portions of this report may have been transcribed using voice recognition software. Every effort was  made to ensure accuracy; however, inadvertent computerized transcription errors may still be present.   Derrick Hoffman 05/25/19 2031    Derrick Sorrow, MD 05/26/19 1430

## 2019-05-25 NOTE — ED Provider Notes (Signed)
[facility]-EMERGENCY DEPT Provider Note   CSN: [id number] Arrival date & time: 05/25/19  1219     History Chief Complaint  Patient presents with  . Chills  . Generalized Body Aches  . Nausea    Derrick Hoffman is a 44 y.o. male with history of alcohol use disorder, marijuana use disorder, kidney stones, appendectomy, hypertension presents to the ER for evaluation of nausea associated with nonbilious nonbloody emesis, hot sweats and chills, fevers and diarrhea.  Symptoms began 4 days ago.  States symptoms are worse in the morning when he wakes up.  States he wakes up in his bed sheets are soaking wet.  Every morning for the last few days has noticed nausea and "acid reflux" in the back of his throat, regurgitation and burning in his chest and throat, bloating.  Yesterday he threw up for the first time.  He has mild, intermittent upper abdominal pain and stomach feels "queasy".  Symptoms seem to improve slightly during the day.  Yesterday he had an oral temperature of 102 and 3 episodes of nonbloody nonmelanotic diarrhea.  No interventions.  Reports couple of years ago he felt similar symptoms when he had appendicitis.  He drinks 4-6 shots of hard liquor every other day.  He quit smoking marijuana 20 days ago.  Denies use of NSAIDs.  No dysuria, hematuria or urinary symptoms.  Denies chest pain or shortness of breath.  No cough, sore throat, congestion.  No sick contacts or travel.  No exposure or intake of suspicious food.  He does not take anything for acid reflux  HPI     Past Medical History:  Diagnosis Date  . Anxiety   . Childhood asthma   . History of kidney stones 1990  . Hypertension     Patient Active Problem List   Diagnosis Date Noted  . Appendicitis 01/05/2018    Past Surgical History:  Procedure Laterality Date  . ANTERIOR CRUCIATE LIGAMENT REPAIR Right 1996  . APPENDECTOMY  01/05/2018  . BUNIONECTOMY Left 2013  . LAPAROSCOPIC APPENDECTOMY N/A  01/05/2018   Procedure: APPENDECTOMY LAPAROSCOPIC;  Surgeon: Griselda Miner, MD;  Location: [facility] OR;  Service: General;  Laterality: N/A;  . ORIF TRIPOD FRACTURE N/A 11/03/2018   Procedure: OPEN REDUCTION INTERNAL FIXATION (ORIF) TRIPOD FRACTURE;  Surgeon: Suzanna Obey, MD;  Location: [facility] OR;  Service: ENT;  Laterality: N/A;  ORIF tripod fracture- malar fracture       No family history on file.  Social History   Tobacco Use  . Smoking status: Current Every Day Smoker    Packs/day: 0.50    Years: 1.00    Pack years: 0.50  . Smokeless tobacco: Never Used  Substance Use Topics  . Alcohol use: Yes    Alcohol/week: 48.0 standard drinks    Types: 48 Shots of liquor per week    Comment: 01/05/2018 " 5th of liquor/day;  3 days/wk  . Drug use: Not Currently    Home Medications Prior to Admission medications   Medication Sig Start Date End Date Taking? Authorizing Provider  amLODipine (NORVASC) 10 MG tablet Take 10 mg by mouth daily. 02/02/18  Yes [provider]  acetaminophen (TYLENOL) 500 MG tablet Take 2 tablets (1,000 mg total) by mouth every 6 (six) hours as needed for mild pain. Patient not taking: Reported on 06/25/2018 01/06/18   Rayburn, Tresa Endo A, PA-C  amLODipine (NORVASC) 5 MG tablet Take 1 tablet (5 mg total) by mouth daily. Patient not taking:  Reported on 06/25/2018 07/20/17   Belinda Fisher, PA-C  cephALEXin (KEFLEX) 500 MG capsule Take 1 capsule (500 mg total) by mouth 4 (four) times daily. Patient not taking: Reported on 05/25/2019 11/20/18   Bennie Pierini, FNP  doxycycline (VIBRAMYCIN) 100 MG capsule Take 1 capsule (100 mg total) by mouth 2 (two) times daily. Patient not taking: Reported on 05/25/2019 10/21/18   Cristina Gong, PA-C  erythromycin ophthalmic ointment Place a 1/2 inch ribbon of ointment into the lower eyelid 6 times a day for 7 days Patient not taking: Reported on 05/25/2019 10/21/18   Cristina Gong, PA-C  ibuprofen (ADVIL,MOTRIN) 800 MG tablet Take 1  tablet (800 mg total) by mouth every 8 (eight) hours as needed for mild pain. Patient not taking: Reported on 06/25/2018 01/06/18   Rayburn, Alphonsus Sias, PA-C  omeprazole (PRILOSEC) 40 MG capsule Take 1 capsule (40 mg total) by mouth daily for 20 days. Patient not taking: Reported on 05/25/2019 06/25/18 11/03/18  Dahlia Client, MD  ondansetron (ZOFRAN ODT) 8 MG disintegrating tablet Take 1 tablet (8 mg total) by mouth every 8 (eight) hours as needed for nausea or vomiting. Patient not taking: Reported on 05/25/2019 09/17/18   Linwood Dibbles, MD  oxyCODONE (OXY IR/ROXICODONE) 5 MG immediate release tablet Take 1-2 tablets (5-10 mg total) by mouth every 6 (six) hours as needed for moderate pain or severe pain (5mg  for moderate pain, 10mg  for severe pain). Patient not taking: Reported on 06/25/2018 01/06/18   Rayburn, 06/27/2018, PA-C  oxyCODONE-acetaminophen (PERCOCET/ROXICET) 5-325 MG tablet Take 1 tablet by mouth every 6 (six) hours as needed for severe pain. Patient not taking: Reported on 05/25/2019 10/21/18   05/27/2019, PA-C  sucralfate (CARAFATE) 1 g tablet Take 1 tablet (1 g total) by mouth 4 (four) times daily -  with meals and at bedtime for 10 days. Patient not taking: Reported on 09/18/2018 06/25/18 07/05/18  06/27/18, MD    Allergies    Penicillins  Review of Systems   Review of Systems  Constitutional: Positive for chills, diaphoresis and fatigue.  Gastrointestinal: Positive for abdominal pain, diarrhea, nausea and vomiting.  All other systems reviewed and are negative.   Physical Exam Updated Vital Signs BP (!) 172/117 (BP Location: Left Arm)   Pulse (!) 116   Temp 98.4 F (36.9 C) (Oral)   Resp 16   SpO2 98%   Physical Exam Vitals and nursing note reviewed.  Constitutional:      Appearance: He is well-developed.     Comments: Found asleep in bed, easily arousable.  Nontoxic.  HENT:     Head: Normocephalic and atraumatic.     Nose: Nose normal.  Eyes:      Conjunctiva/sclera: Conjunctivae normal.  Cardiovascular:     Rate and Rhythm: Normal rate and regular rhythm.     Heart sounds: Normal heart sounds.  Pulmonary:     Effort: Pulmonary effort is normal.     Breath sounds: Normal breath sounds.  Abdominal:     General: Bowel sounds are normal.     Palpations: Abdomen is soft.     Tenderness: There is abdominal tenderness (Epigastric).     Comments: No G/R/R. No suprapubic or CVA tenderness. Negative Murphy's and McBurney's.  Active bowel sounds to lower quadrants.  Musculoskeletal:        General: Normal range of motion.     Cervical back: Normal range of motion.  Skin:    General: Skin is warm  and dry.     Capillary Refill: Capillary refill takes less than 2 seconds.  Neurological:     Mental Status: He is alert.  Psychiatric:        Behavior: Behavior normal.     ED Results / Procedures / Treatments   Labs (all labs ordered are listed, but only abnormal results are displayed) Labs Reviewed  CBC WITH DIFFERENTIAL/PLATELET - Abnormal; Notable for the following components:      Result Value   WBC 12.3 (*)    Neutro Abs 9.2 (*)    All other components within normal limits  COMPREHENSIVE METABOLIC PANEL - Abnormal; Notable for the following components:   Potassium 3.1 (*)    Glucose, Bld 109 (*)    Total Protein 8.7 (*)    AST 270 (*)    ALT 317 (*)    Total Bilirubin 2.6 (*)    All other components within normal limits  LIPASE, BLOOD - Abnormal; Notable for the following components:   Lipase 90 (*)    All other components within normal limits  URINALYSIS, ROUTINE W REFLEX MICROSCOPIC  ETHANOL  ACETAMINOPHEN LEVEL  HEPATITIS PANEL, ACUTE  RAPID HIV SCREEN (HIV 1/2 AB+AG)  POC SARS CORONAVIRUS 2 AG -  ED    EKG None  Radiology No results found.  Procedures Procedures (including critical care time)  Medications Ordered in ED Medications  ondansetron (ZOFRAN) injection 4 mg (4 mg Intravenous Given 05/25/19 1410)   famotidine (PEPCID) IVPB 20 mg premix (20 mg Intravenous New Bag/Given 05/25/19 1410)  alum & mag hydroxide-simeth (MAALOX/MYLANTA) 200-200-20 MG/5ML suspension 30 mL (30 mLs Oral Given 05/25/19 1410)    And  lidocaine (XYLOCAINE) 2 % viscous mouth solution 15 mL (15 mLs Oral Given 05/25/19 1411)  sodium chloride 0.9 % bolus 1,000 mL (1,000 mLs Intravenous New Bag/Given 05/25/19 1410)    ED Course  I have reviewed the triage vital signs and the nursing notes.  Pertinent labs & imaging results that were available during my care of the patient were reviewed by me and considered in my medical decision making (see chart for details).  Clinical Course as of May 24 1528  Thu May 25, 2019  1510 WBC(!): 12.3 [CG]  1510 Pulse Rate(!): 116 [CG]  1510 Lipase(!): 90 [CG]  1511 Potassium(!): 3.1 [CG]  1511 AST(!): 270 [CG]  1511 ALT(!): 317 [CG]  1511 Total Bilirubin(!): 2.6 [CG]    Clinical Course User Index [CG] Jerrell Mylar   MDM Rules/Calculators/A&P                      44 year old male with history of alcohol use disorder, marijuana use presents with chills, sweats, fever of 102, vomiting, diarrhea and epigastric abdominal pain.  Exam reveals mild epigastric tenderness.  No peritonitis.  Negative Murphy's and McBurney's.  He has no urinary symptoms.  Denies hematemesis, melena.  EMR reviewed.  He had 3 ER visits for nausea, vomiting in 2020.  Most recent CT A/P showed hepatic steatosis, small per umbilical hernia, diverticula.   DDx includes viral gastroenteritis, alcoholic gastritis, pancreatitis, diverticulitis.  Given fever, vomiting, diarrhea COVID-19 illness also on differential.  Doubt SBO, ischemic gut, perforation.  S/p appendectomy.  No urinary symptoms or flank pain to suggest renal stone.  1518: ER work up personally reviewed and interpreted.   Patient is tachycardic this could be due to dehydration or stress response from vomiting/pain.  Afebrile.  Overall non  toxic appearing.  HD stable. Does not appear to be critically ill or septic/shock.  Mild leukocytosis with left shift.  Mild hypokalemia.  Lipase is 90 and LFTs are elevated with total bilirubin of 2.6.  No h/o elevated LFT in the past.  Pt re-evaluated. No clinical decline, patient feels slightly better after IV fluids, antiemetics, GI cocktail and Pepcid.  He has persistent epigastric tenderness. Urine at bedside is cranberry colored. No urinary symptoms, ?possible related to hyperbilirubinemia.   Given elevation in LFTs, lipase and epigastric tenderness with reported fever and diarrhea will proceed with CT A/P to evaluate for pancreatitis, biliary duct/gall bladder process.    Will add ETOH, acute hepatitis panel, HIV, acetaminophen level.   1530: Pt will be handed off to oncoming EDPA who will f/u on CTAP, reassess.  Disposition per imaging findings, clinical course.  If work up benign consider send out COVID test to r/o infection.   Final Clinical Impression(s) / ED Diagnoses Final diagnoses:  Nausea vomiting and diarrhea  Elevated LFTs    Rx / DC Orders ED Discharge Orders    None       Arlean Hopping 05/25/19 1530    Hayden Rasmussen, MD 05/25/19 1651

## 2019-05-25 NOTE — ED Triage Notes (Signed)
Pt states he has not eaten in several days. Pt states that every time he goes to eat he feels reflux. Pt states at night he alternates between sweats and chills. Denies any COVID contacts.

## 2019-05-25 NOTE — ED Notes (Signed)
Date and time results received: 05/25/19 5:26 PM  (use smartphrase ".now" to insert current time)  Test: Rapid HIV Critical Value: Negative  Name of Provider Notified: Apolinar Junes PA  Orders Received? Or Actions Taken?: Orders Received - See Orders for details

## 2019-05-25 NOTE — Discharge Instructions (Addendum)
You have been diagnosed today with viral illness, suspected COVID-19 viral infection, elevated liver function tests, nausea vomiting and diarrhea.  At this time there does not appear to be the presence of an emergent medical condition, however there is always the potential for conditions to change. Please read and follow the below instructions.  Please return to the Emergency Department immediately for any new or worsening symptoms. Please be sure to follow up with your Primary Care Provider within one week regarding your visit today; please call their office to schedule an appointment even if you are feeling better for a follow-up visit. Your Covid test is currently pending and will result in the next 1-2 days.  Please check your MyChart account to review your results and discuss them with your primary care provider at your follow-up visit next week.  Please drink plenty of water and get plenty of rest.  Continue to isolate to avoid spread of potential illness.  There is a potential of a false negative Covid test so even if your test is negative continue to isolate for at least 7 days after your symptoms resolve. Your CT scan had multiple incidental findings.  The first is indistinct patchy groundglass opacities that are concerning for COVID-19 infection.  Additionally it showed hepatic steatosis.  There was also a sub-5 mm lesion to the right liver dome that will need a follow-up MRI scan in 3-6 months to see if this is a lesion concerning for cancer.  Your CT scan also showed aortic atherosclerosis.  Please discuss these incidental findings with your primary care provider at your follow-up visit this week. Additionally your liver function tests were elevated today, this may be due to viral infection however it is important that you cut back on your alcohol intake as alcohol will damage your liver. Please take the potassium supplements as prescribed.  Your potassium level was low today this is likely due  to your illness, have your primary care provider recheck your potassium level at your follow-up visit. You may use the medication Zofran as prescribed to help with nausea and vomiting. Your blood pressure was elevated today, please have your blood pressure rechecked by your primary care provider within 1 week and discuss potential medication management with them.  Get help right away if: You have trouble breathing. You have pain or pressure in your chest. You have confusion. You have bluish lips and fingernails. You have difficulty waking from sleep. You cannot stop vomiting. Your pain is only in areas of your belly, such as the right side or the left lower part of the belly. You have bloody or black poop, or poop that looks like tar. You have very bad pain, cramping, or bloating in your belly. You have signs of not having enough fluid or water in your body (dehydration), such as: Dark pee, very little pee, or no pee. Cracked lips. Dry mouth. Sunken eyes. Sleepiness. Weakness. You have trouble breathing or chest pain. You have any new/concerning or worsening of symptoms  Please read the additional information packets attached to your discharge summary.  Do not take your medicine if  develop an itchy rash, swelling in your mouth or lips, or difficulty breathing; call 911 and seek immediate emergency medical attention if this occurs.  Note: Portions of this text may have been transcribed using voice recognition software. Every effort was made to ensure accuracy; however, inadvertent computerized transcription errors may still be present.

## 2019-05-26 LAB — NOVEL CORONAVIRUS, NAA (HOSP ORDER, SEND-OUT TO REF LAB; TAT 18-24 HRS): SARS-CoV-2, NAA: NOT DETECTED

## 2019-08-16 ENCOUNTER — Ambulatory Visit (INDEPENDENT_AMBULATORY_CARE_PROVIDER_SITE_OTHER)
Admission: RE | Admit: 2019-08-16 | Discharge: 2019-08-16 | Disposition: A | Discharge status: Other Acute Inpatient Hospital | Payer: Self-pay | Source: Ambulatory Visit

## 2019-08-16 ENCOUNTER — Other Ambulatory Visit: Payer: Self-pay

## 2019-08-16 ENCOUNTER — Ambulatory Visit (HOSPITAL_COMMUNITY)
Admission: EM | Admit: 2019-08-16 | Discharge: 2019-08-16 | Disposition: A | Discharge status: Home / Self Care | Payer: Self-pay | Attending: Urgent Care | Admitting: Urgent Care

## 2019-08-16 DIAGNOSIS — M79661 Pain in right lower leg: Secondary | ICD-10-CM

## 2019-08-16 DIAGNOSIS — S80861A Insect bite (nonvenomous), right lower leg, initial encounter: Secondary | ICD-10-CM

## 2019-08-16 DIAGNOSIS — W57XXXA Bitten or stung by nonvenomous insect and other nonvenomous arthropods, initial encounter: Secondary | ICD-10-CM

## 2019-08-16 DIAGNOSIS — L03115 Cellulitis of right lower limb: Secondary | ICD-10-CM

## 2019-08-16 DIAGNOSIS — I10 Essential (primary) hypertension: Secondary | ICD-10-CM

## 2019-08-16 MED ORDER — AMLODIPINE BESYLATE 5 MG PO TABS: 5.0000 mg | ORAL_TABLET | Freq: Every day | ORAL | 0 refills | Status: DC

## 2019-08-16 MED ORDER — DOXYCYCLINE HYCLATE 100 MG PO CAPS: 100.0000 mg | ORAL_CAPSULE | Freq: Two times a day (BID) | ORAL | 0 refills | Status: DC

## 2019-08-16 MED ORDER — NAPROXEN 500 MG PO TABS: 500.0000 mg | ORAL_TABLET | Freq: Two times a day (BID) | ORAL | 0 refills | Status: DC

## 2019-08-16 NOTE — Discharge Instructions (Addendum)
Please report to the [facility] urgent care now so that we could evaluate you in person for possible cellulitis versus abscess from the tick bite.

## 2019-08-16 NOTE — ED Triage Notes (Signed)
Pt removed tick under R knee on Monday, woke up today with pain, swelling and drainage to the area. Sent by telemedicine doctor for evaluation.

## 2019-08-16 NOTE — Discharge Instructions (Addendum)
Please change your dressing 2-3 times daily using non-stick (non-adherent) dressing pads. Do not apply any ointments or creams to the wound. Each time you change your dressing, make sure you clean gently around the wound with gentle soap and warm water. Pat your wound dry and let it air out if possible before reapplying another dressing. Definitely cover your wound at night and if you are going to work. You can stop using dressings once your wound has scabbed over well and is no longer draining.    For diabetes or elevated blood sugar, please make sure you are avoiding starchy, carbohydrate foods like pasta, breads, pastry, rice, potatoes, desserts. These foods can elevated your blood sugar. Also, avoid sodas, sweet teas, sugary beverages, fruit juices.  Drinking plain water will be much more helpful, try 64 ounces of water daily.  It is okay to flavor your water naturally by cutting cucumber or lemon and mint or lime, placing it in a picture with water and drinking it over a period of 2 to 3 days as long as it remains refrigerated.    For elevated blood pressure, make sure you are monitoring salt in your diet.  Do not eat restaurant foods and limit processed foods at home, prepare/cook your own foods at home.  Processed foods include things like frozen meals preseasoned meats and dinners, deli meats, canned foods as they are high in sodium/salt.  Make sure your pain attention to sodium labels on foods you by at the grocery store.  For seasoning you can use a brand called Mrs. Dash which includes a lot of salt free seasonings.  Salads - kale, spinach, cabbage, spring mix; use seeds like pumpkin seeds or sunflower seeds, almonds, walnuts or pecans; you can also use 1-2 hard boiled eggs in your salads Fruits - avocadoes, berries (blueberries, raspberries, blackberries), apples, oranges Vegetables - aspargus, cauliflower, broccoli, green beans, brussel spouts, bell peppers; stay away from starchy vegetables  like potatoes, carrots, peas  Regarding meat it is better to eat lean meats and limit your red meat consumption including pork.  Wild caught fish, chicken breast are good options.  DO NOT EAT ANY FOODS ON THIS LIST THAT YOU ARE ALLERGIC TO.

## 2019-08-16 NOTE — ED Provider Notes (Signed)
Virtual Visit via Video Note:  Derrick Hoffman  initiated request for Telemedicine visit with [facility] Urgent Care team. I connected with Derrick Hoffman  on 08/16/2019 at 6:21 PM  for a synchronized telemedicine visit using a video enabled HIPPA compliant telemedicine application. I verified that I am speaking with Derrick Hoffman  using two identifiers. Derrick Bamberg, PA-C  was physically located in a [facility] Urgent care site and Derrick Hoffman was located at a different location.   The limitations of evaluation and management by telemedicine as well as the availability of in-person appointments were discussed. Patient was informed that he  may incur a bill ( including co-pay) for this virtual visit encounter. Derrick Hoffman  expressed understanding and gave verbal consent to proceed with virtual visit.     History of Present Illness:Derrick Hoffman  is a 44 y.o. male presents with 3 day hx of acute onset posterior right lower leg pain with swelling, calf pain following a tick bite.  Patient reports that the wound has been draining and that the calf is very hardened.  ROS  Current Outpatient Medications  Medication Sig Dispense Refill  . acetaminophen (TYLENOL) 500 MG tablet Take 2 tablets (1,000 mg total) by mouth every 6 (six) hours as needed for mild pain. (Patient not taking: Reported on 06/25/2018)  0  . amLODipine (NORVASC) 10 MG tablet Take 10 mg by mouth daily.    Marland Kitchen amLODipine (NORVASC) 5 MG tablet Take 1 tablet (5 mg total) by mouth daily. (Patient not taking: Reported on 06/25/2018) 30 tablet 0  . cephALEXin (KEFLEX) 500 MG capsule Take 1 capsule (500 mg total) by mouth 4 (four) times daily. (Patient not taking: Reported on 05/25/2019) 20 capsule 0  . doxycycline (VIBRAMYCIN) 100 MG capsule Take 1 capsule (100 mg total) by mouth 2 (two) times daily. (Patient not taking: Reported on 05/25/2019) 20 capsule 0  . erythromycin ophthalmic ointment Place a 1/2 inch ribbon of ointment into the lower  eyelid 6 times a day for 7 days (Patient not taking: Reported on 05/25/2019) 3.5 g 0  . ibuprofen (ADVIL,MOTRIN) 800 MG tablet Take 1 tablet (800 mg total) by mouth every 8 (eight) hours as needed for mild pain. (Patient not taking: Reported on 06/25/2018) 30 tablet 0  . omeprazole (PRILOSEC) 40 MG capsule Take 1 capsule (40 mg total) by mouth daily for 20 days. (Patient not taking: Reported on 05/25/2019) 20 capsule 0  . ondansetron (ZOFRAN ODT) 4 MG disintegrating tablet Take 1 tablet (4 mg total) by mouth every 8 (eight) hours as needed for nausea or vomiting. 9 tablet 0  . oxyCODONE (OXY IR/ROXICODONE) 5 MG immediate release tablet Take 1-2 tablets (5-10 mg total) by mouth every 6 (six) hours as needed for moderate pain or severe pain (5mg  for moderate pain, 10mg  for severe pain). (Patient not taking: Reported on 06/25/2018) 15 tablet 0  . oxyCODONE-acetaminophen (PERCOCET/ROXICET) 5-325 MG tablet Take 1 tablet by mouth every 6 (six) hours as needed for severe pain. (Patient not taking: Reported on 05/25/2019) 15 tablet 0  . potassium chloride (KLOR-CON) 10 MEQ tablet Take 2 tablets (20 mEq total) by mouth daily for 3 days. 6 tablet 0  . sucralfate (CARAFATE) 1 g tablet Take 1 tablet (1 g total) by mouth 4 (four) times daily -  with meals and at bedtime for 10 days. (Patient not taking: Reported on 09/18/2018) 40 tablet 0   No current facility-administered medications for this  encounter.     Allergies  Allergen Reactions  . Penicillins Swelling    Did it involve swelling of the face/tongue/throat, SOB, or low BP? Y Did it involve sudden or severe rash/hives, skin peeling, or any reaction on the inside of your mouth or nose? N Did you need to seek medical attention at a hospital or doctor's office? Y When did it last happen? 10 years If all above answers are "NO", may proceed with cephalosporin use.     Past Medical History:  Diagnosis Date  . Anxiety   . Childhood asthma   . History of kidney  stones 1990  . Hypertension     Past Surgical History:  Procedure Laterality Date  . ANTERIOR CRUCIATE LIGAMENT REPAIR Right 1996  . APPENDECTOMY  01/05/2018  . BUNIONECTOMY Left 2013  . LAPAROSCOPIC APPENDECTOMY N/A 01/05/2018   Procedure: APPENDECTOMY LAPAROSCOPIC;  Surgeon: Jovita Kussmaul, MD;  Location: [place];  Service: General;  Laterality: N/A;  . ORIF TRIPOD FRACTURE N/A 11/03/2018   Procedure: OPEN REDUCTION INTERNAL FIXATION (ORIF) TRIPOD FRACTURE;  Surgeon: Melissa Montane, MD;  Location: [place];  Service: ENT;  Laterality: N/A;  ORIF tripod fracture- malar fracture      Observations/Objective: Physical Exam Constitutional:      General: He is not in acute distress.    Appearance: Normal appearance. He is not ill-appearing, toxic-appearing or diaphoretic.  HENT:     Head: Atraumatic.  Eyes:     General:        Right eye: No discharge.        Left eye: No discharge.     Extraocular Movements: Extraocular movements intact.  Pulmonary:     Effort: Pulmonary effort is normal.  Musculoskeletal:       Legs:  Neurological:     Mental Status: He is alert and oriented to person, place, and time.  Psychiatric:        Mood and Affect: Mood normal.        Behavior: Behavior normal.        Thought Content: Thought content normal.        Judgment: Judgment normal.      Assessment and Plan:  1. Tick bite of right lower leg, initial encounter   2. Pain of right lower leg     Patient has physical signs of abscess versus cellulitis secondary to tick bite.  Recommended an in person office visit for vital signs, physical exam and image documentation of his wound.  Patient states that he will report to the [facility] urgent care now.  Follow Up Instructions:    I discussed the assessment and treatment plan with the patient. The patient was provided an opportunity to ask questions and all were answered. The patient agreed with the plan and demonstrated an understanding of the  instructions.   The patient was advised to call back or seek an in-person evaluation if the symptoms worsen or if the condition fails to improve as anticipated.  I provided 10 minutes of non-face-to-face time during this encounter.    Jaynee Eagles, PA-C  08/16/2019 6:21 PM         Jaynee Eagles, PA-C 08/16/19 1828

## 2019-08-16 NOTE — ED Provider Notes (Signed)
[place]   MRN: [id number] DOB: 1976/04/12  Subjective:   Derrick Hoffman is a 44 y.o. male presenting for 3-day history of acute onset pain over the back of his right knee with associated swelling and drainage.  Patient states that this came from a tick bite.  He started to have drainage shortly after taking the tick off.  He has a history of hypertension.  He is not on his amlodipine right now.  Denies fever, headache, sore throat, cough, systemic illness.  Pain is localized to the back of his knee around the tick bite but not across the anterior portion of his knee.  He has used triple antibiotic ointment over the area.  Is not currently taking any medications.  Allergies  Allergen Reactions  . Penicillins Swelling    Did it involve swelling of the face/tongue/throat, SOB, or low BP? Y Did it involve sudden or severe rash/hives, skin peeling, or any reaction on the inside of your mouth or nose? N Did you need to seek medical attention at a hospital or doctor's office? Y When did it last happen? 10 years If all above answers are "NO", may proceed with cephalosporin use.    Past Medical History:  Diagnosis Date  . Anxiety   . Childhood asthma   . History of kidney stones 1990  . Hypertension      Past Surgical History:  Procedure Laterality Date  . ANTERIOR CRUCIATE LIGAMENT REPAIR Right 1996  . APPENDECTOMY  01/05/2018  . BUNIONECTOMY Left 2013  . LAPAROSCOPIC APPENDECTOMY N/A 01/05/2018   Procedure: APPENDECTOMY LAPAROSCOPIC;  Surgeon: Jovita Kussmaul, MD;  Location: [facility];  Service: General;  Laterality: N/A;  . ORIF TRIPOD FRACTURE N/A 11/03/2018   Procedure: OPEN REDUCTION INTERNAL FIXATION (ORIF) TRIPOD FRACTURE;  Surgeon: Melissa Montane, MD;  Location: [facility];  Service: ENT;  Laterality: N/A;  ORIF tripod fracture- malar fracture    No family history on file.  Social History   Tobacco Use  . Smoking status: Current Every Day Smoker    Packs/day: 0.50    Years:  1.00    Pack years: 0.50  . Smokeless tobacco: Never Used  Substance Use Topics  . Alcohol use: Yes    Alcohol/week: 48.0 standard drinks    Types: 48 Shots of liquor per week    Comment: 01/05/2018 " 5th of liquor/day;  3 days/wk  . Drug use: Not Currently    ROS   Objective:   Vitals: BP (!) 146/93   Pulse (!) 103   Temp 98.7 F (37.1 C)   Resp 16   SpO2 96%   Physical Exam Constitutional:      General: He is not in acute distress.    Appearance: Normal appearance. He is well-developed and normal weight. He is not ill-appearing, toxic-appearing or diaphoretic.  HENT:     Head: Normocephalic and atraumatic.     Right Ear: External ear normal.     Left Ear: External ear normal.     Nose: Nose normal.     Mouth/Throat:     Pharynx: Oropharynx is clear.  Eyes:     General: No scleral icterus.       Right eye: No discharge.        Left eye: No discharge.     Extraocular Movements: Extraocular movements intact.     Pupils: Pupils are equal, round, and reactive to light.  Cardiovascular:     Rate and Rhythm: Normal rate.  Pulmonary:     Effort: Pulmonary effort is normal.  Musculoskeletal:     Cervical back: Normal range of motion.  Skin:    General: Skin is warm and dry.  Neurological:     Mental Status: He is alert and oriented to person, place, and time.  Psychiatric:        Mood and Affect: Mood normal.        Behavior: Behavior normal.        Thought Content: Thought content normal.        Judgment: Judgment normal.          Assessment and Plan :   PDMP not reviewed this encounter.  1. Pain of right lower leg   2. Cellulitis of right lower extremity   3. Tick bite of right lower leg, initial encounter   4. Essential hypertension     Start doxycycline to address cellulitis secondary to the tick bite.  Use naproxen for pain and inflammation.  Reviewed wound care.  Restart amlodipine, discussed need for dietary modifications and follow-up with a  new PCP. Counseled patient on potential for adverse effects with medications prescribed/recommended today, ER and return-to-clinic precautions discussed, patient verbalized understanding.    Wallis Bamberg, PA-C 08/16/19 2017

## 2019-09-24 ENCOUNTER — Other Ambulatory Visit: Payer: Self-pay

## 2019-09-24 ENCOUNTER — Encounter (HOSPITAL_COMMUNITY): Payer: Self-pay | Admitting: Emergency Medicine

## 2019-09-24 ENCOUNTER — Observation Stay (HOSPITAL_COMMUNITY): Payer: Self-pay

## 2019-09-24 ENCOUNTER — Emergency Department (HOSPITAL_COMMUNITY): Payer: Self-pay

## 2019-09-24 ENCOUNTER — Inpatient Hospital Stay (HOSPITAL_COMMUNITY)
Admission: EM | Admit: 2019-09-24 | Discharge: 2019-09-28 | DRG: 286 | Disposition: A | Discharge status: Home / Self Care | Payer: Self-pay | Attending: Internal Medicine | Admitting: Internal Medicine

## 2019-09-24 DIAGNOSIS — F1010 Alcohol abuse, uncomplicated: Secondary | ICD-10-CM | POA: Diagnosis present

## 2019-09-24 DIAGNOSIS — Z20822 Contact with and (suspected) exposure to covid-19: Secondary | ICD-10-CM | POA: Diagnosis present

## 2019-09-24 DIAGNOSIS — E785 Hyperlipidemia, unspecified: Secondary | ICD-10-CM | POA: Diagnosis present

## 2019-09-24 DIAGNOSIS — R519 Headache, unspecified: Secondary | ICD-10-CM | POA: Diagnosis present

## 2019-09-24 DIAGNOSIS — I5021 Acute systolic (congestive) heart failure: Secondary | ICD-10-CM | POA: Diagnosis present

## 2019-09-24 DIAGNOSIS — E876 Hypokalemia: Secondary | ICD-10-CM | POA: Diagnosis present

## 2019-09-24 DIAGNOSIS — F419 Anxiety disorder, unspecified: Secondary | ICD-10-CM | POA: Diagnosis present

## 2019-09-24 DIAGNOSIS — Z23 Encounter for immunization: Secondary | ICD-10-CM

## 2019-09-24 DIAGNOSIS — D72829 Elevated white blood cell count, unspecified: Secondary | ICD-10-CM | POA: Diagnosis present

## 2019-09-24 DIAGNOSIS — R778 Other specified abnormalities of plasma proteins: Secondary | ICD-10-CM | POA: Diagnosis present

## 2019-09-24 DIAGNOSIS — F1910 Other psychoactive substance abuse, uncomplicated: Secondary | ICD-10-CM | POA: Diagnosis present

## 2019-09-24 DIAGNOSIS — R079 Chest pain, unspecified: Secondary | ICD-10-CM | POA: Diagnosis present

## 2019-09-24 DIAGNOSIS — R6510 Systemic inflammatory response syndrome (SIRS) of non-infectious origin without acute organ dysfunction: Secondary | ICD-10-CM | POA: Diagnosis present

## 2019-09-24 DIAGNOSIS — I110 Hypertensive heart disease with heart failure: Principal | ICD-10-CM | POA: Diagnosis present

## 2019-09-24 DIAGNOSIS — R000 Tachycardia, unspecified: Secondary | ICD-10-CM | POA: Diagnosis present

## 2019-09-24 DIAGNOSIS — K760 Fatty (change of) liver, not elsewhere classified: Secondary | ICD-10-CM | POA: Diagnosis present

## 2019-09-24 DIAGNOSIS — I160 Hypertensive urgency: Secondary | ICD-10-CM | POA: Diagnosis present

## 2019-09-24 DIAGNOSIS — Z880 Allergy status to penicillin: Secondary | ICD-10-CM

## 2019-09-24 DIAGNOSIS — F17210 Nicotine dependence, cigarettes, uncomplicated: Secondary | ICD-10-CM | POA: Diagnosis present

## 2019-09-24 DIAGNOSIS — W57XXXA Bitten or stung by nonvenomous insect and other nonvenomous arthropods, initial encounter: Secondary | ICD-10-CM | POA: Diagnosis present

## 2019-09-24 DIAGNOSIS — Z8249 Family history of ischemic heart disease and other diseases of the circulatory system: Secondary | ICD-10-CM

## 2019-09-24 DIAGNOSIS — Z9114 Patient's other noncompliance with medication regimen: Secondary | ICD-10-CM

## 2019-09-24 DIAGNOSIS — I700 Atherosclerosis of aorta: Secondary | ICD-10-CM | POA: Diagnosis present

## 2019-09-24 DIAGNOSIS — I428 Other cardiomyopathies: Secondary | ICD-10-CM | POA: Diagnosis present

## 2019-09-24 DIAGNOSIS — R234 Changes in skin texture: Secondary | ICD-10-CM | POA: Diagnosis present

## 2019-09-24 DIAGNOSIS — F1290 Cannabis use, unspecified, uncomplicated: Secondary | ICD-10-CM | POA: Diagnosis present

## 2019-09-24 DIAGNOSIS — R945 Abnormal results of liver function studies: Secondary | ICD-10-CM | POA: Diagnosis present

## 2019-09-24 DIAGNOSIS — R509 Fever, unspecified: Secondary | ICD-10-CM | POA: Diagnosis present

## 2019-09-24 DIAGNOSIS — I161 Hypertensive emergency: Secondary | ICD-10-CM

## 2019-09-24 HISTORY — DX: Hypokalemia: E87.6

## 2019-09-24 HISTORY — DX: Other problems related to lifestyle: Z72.89

## 2019-09-24 HISTORY — DX: Alcohol use, unspecified, uncomplicated: F10.90

## 2019-09-24 HISTORY — DX: Other specified abnormal findings of blood chemistry: R79.89

## 2019-09-24 HISTORY — DX: Atherosclerosis of aorta: I70.0

## 2019-09-24 HISTORY — DX: Tobacco use: Z72.0

## 2019-09-24 HISTORY — DX: Fatty (change of) liver, not elsewhere classified: K76.0

## 2019-09-24 HISTORY — DX: Abnormal results of liver function studies: R94.5

## 2019-09-24 LAB — CBC
HCT: 50.4 % (ref 39.0–52.0)
Hemoglobin: 17 g/dL (ref 13.0–17.0)
MCH: 33 pg (ref 26.0–34.0)
MCHC: 33.7 g/dL (ref 30.0–36.0)
MCV: 97.9 fL (ref 80.0–100.0)
Platelets: 291 10*3/uL (ref 150–400)
RBC: 5.15 MIL/uL (ref 4.22–5.81)
RDW: 14.2 % (ref 11.5–15.5)
WBC: 13.9 10*3/uL — ABNORMAL HIGH (ref 4.0–10.5)
nRBC: 0 % (ref 0.0–0.2)

## 2019-09-24 LAB — BASIC METABOLIC PANEL
Anion gap: 17 — ABNORMAL HIGH (ref 5–15)
BUN: 9 mg/dL (ref 6–20)
CO2: 22 mmol/L (ref 22–32)
Calcium: 9.1 mg/dL (ref 8.9–10.3)
Chloride: 103 mmol/L (ref 98–111)
Creatinine, Ser: 1.04 mg/dL (ref 0.61–1.24)
GFR calc Af Amer: >60 mL/min (ref >60)
GFR calc non Af Amer: >60 mL/min (ref >60)
Glucose, Bld: 101 mg/dL — ABNORMAL HIGH (ref 70–99)
Potassium: 3.3 mmol/L — ABNORMAL LOW (ref 3.5–5.1)
Sodium: 142 mmol/L (ref 135–145)

## 2019-09-24 LAB — TROPONIN I (HIGH SENSITIVITY)
Troponin I (High Sensitivity): 23 ng/L — ABNORMAL HIGH (ref <18)
Troponin I (High Sensitivity): 24 ng/L — ABNORMAL HIGH (ref <18)

## 2019-09-24 LAB — LACTIC ACID, PLASMA: Lactic Acid, Venous: 1.8 mmol/L (ref 0.5–1.9)

## 2019-09-24 LAB — SARS CORONAVIRUS 2 BY RT PCR (HOSPITAL ORDER, PERFORMED IN ~~LOC~~ HOSPITAL LAB): SARS Coronavirus 2: NEGATIVE

## 2019-09-24 MED ORDER — IBUPROFEN 800 MG PO TABS
800.0000 mg | ORAL_TABLET | Freq: Once | ORAL | Status: AC
  Administered 2019-09-24: 800 mg via ORAL
  Filled 2019-09-24: qty 1

## 2019-09-24 MED ORDER — SODIUM CHLORIDE 0.9 % IV BOLUS
1000.0000 mL | Freq: Once | INTRAVENOUS | Status: AC
  Administered 2019-09-24: 1000 mL via INTRAVENOUS

## 2019-09-24 MED ORDER — SODIUM CHLORIDE 0.9% FLUSH: 3.0000 mL | Freq: Once | INTRAVENOUS | Status: DC

## 2019-09-24 MED ORDER — IOHEXOL 350 MG/ML SOLN
100.0000 mL | Freq: Once | INTRAVENOUS | Status: AC | PRN
  Administered 2019-09-24: 100 mL via INTRAVENOUS

## 2019-09-24 NOTE — ED Triage Notes (Addendum)
C/o sharp pain to center of chest, SOB, nausea, and non-productive cough that started today.  Pt hypertensive and states he has been out of BP medication x 1 week.

## 2019-09-24 NOTE — ED Provider Notes (Signed)
[facility] EMERGENCY DEPARTMENT Provider Note   CSN: [id number] Arrival date & time: 09/24/19  1844     History No chief complaint on file.   Derrick Hoffman is a 44 y.o. male.  With a past medical history of medical noncompliance, hypertension, alcohol misuse disorder drinking about at least a pint of liquor daily who presents the emergency department with chief complaint of chest pain and shortness of breath.  He had onset of the symptoms today.  He complains of chills, myalgias, chest pain which is pressure-like, shortness of breath at rest which is worse with exertion.  Patient states "when I get up and go to the bathroom and walked back I have to stop and catch my breath."  This is new.  His wife was at the bedside states that he has appeared very winded today.  The patient did have a tick bite back in mid April and was prescribed doxycycline.  He did not complete the course of antibiotics.  He has not had his Covid vaccination.  He denies any known exposures.  He is a daily smoker.  HPI     Past Medical History:  Diagnosis Date  . Anxiety   . Childhood asthma   . History of kidney stones 1990  . Hypertension     Patient Active Problem List   Diagnosis Date Noted  . Appendicitis 01/05/2018    Past Surgical History:  Procedure Laterality Date  . ANTERIOR CRUCIATE LIGAMENT REPAIR Right 1996  . APPENDECTOMY  01/05/2018  . BUNIONECTOMY Left 2013  . LAPAROSCOPIC APPENDECTOMY N/A 01/05/2018   Procedure: APPENDECTOMY LAPAROSCOPIC;  Surgeon: Jovita Kussmaul, MD;  Location: [facility];  Service: General;  Laterality: N/A;  . ORIF TRIPOD FRACTURE N/A 11/03/2018   Procedure: OPEN REDUCTION INTERNAL FIXATION (ORIF) TRIPOD FRACTURE;  Surgeon: Melissa Montane, MD;  Location: [facility];  Service: ENT;  Laterality: N/A;  ORIF tripod fracture- malar fracture       No family history on file.  Social History   Tobacco Use  . Smoking status: Current Every Day Smoker    Packs/day:  0.50    Years: 1.00    Pack years: 0.50  . Smokeless tobacco: Never Used  Substance Use Topics  . Alcohol use: Yes    Alcohol/week: 48.0 standard drinks    Types: 48 Shots of liquor per week    Comment: 01/05/2018 " 5th of liquor/day;  3 days/wk  . Drug use: Not Currently    Home Medications Prior to Admission medications   Medication Sig Start Date End Date Taking? Authorizing Provider  amLODipine (NORVASC) 5 MG tablet Take 1 tablet (5 mg total) by mouth daily. 08/16/19   Jaynee Eagles, PA-C  doxycycline (VIBRAMYCIN) 100 MG capsule Take 1 capsule (100 mg total) by mouth 2 (two) times daily. 08/16/19   Jaynee Eagles, PA-C  naproxen (NAPROSYN) 500 MG tablet Take 1 tablet (500 mg total) by mouth 2 (two) times daily. 08/16/19   Jaynee Eagles, PA-C  omeprazole (PRILOSEC) 40 MG capsule Take 1 capsule (40 mg total) by mouth daily for 20 days. Patient not taking: Reported on 05/25/2019 06/25/18 11/03/18  Orson Aloe, MD  ondansetron (ZOFRAN ODT) 4 MG disintegrating tablet Take 1 tablet (4 mg total) by mouth every 8 (eight) hours as needed for nausea or vomiting. 05/25/19   Nuala Alpha A, PA-C  oxyCODONE (OXY IR/ROXICODONE) 5 MG immediate release tablet Take 1-2 tablets (5-10 mg total) by mouth every 6 (six) hours as  needed for moderate pain or severe pain (5mg  for moderate pain, 10mg  for severe pain). Patient not taking: Reported on 06/25/2018 01/06/18   06/27/2018, PA-C  oxyCODONE-acetaminophen (PERCOCET/ROXICET) 5-325 MG tablet Take 1 tablet by mouth every 6 (six) hours as needed for severe pain. Patient not taking: Reported on 05/25/2019 10/21/18   05/27/2019, PA-C  potassium chloride (KLOR-CON) 10 MEQ tablet Take 2 tablets (20 mEq total) by mouth daily for 3 days. 05/25/19 05/28/19  05/27/19 A, PA-C  sucralfate (CARAFATE) 1 g tablet Take 1 tablet (1 g total) by mouth 4 (four) times daily -  with meals and at bedtime for 10 days. Patient not taking: Reported on 09/18/2018 06/25/18  07/05/18  06/27/18, MD    Allergies    Penicillins  Review of Systems   Review of Systems Ten systems reviewed and are negative for acute change, except as noted in the HPI.   Physical Exam Updated Vital Signs BP (!) 179/115   Pulse 99   Temp 99.1 F (37.3 C) (Oral)   Resp (!) 31   Ht 6\' 2"  (1.88 m)   Wt 104.3 kg   SpO2 99%   BMI 29.53 kg/m   Physical Exam Vitals and nursing note reviewed.  Constitutional:      General: He is not in acute distress.    Appearance: He is well-developed. He is ill-appearing. He is not diaphoretic.  HENT:     Head: Normocephalic and atraumatic.  Eyes:     General: No scleral icterus.    Conjunctiva/sclera: Conjunctivae normal.  Cardiovascular:     Rate and Rhythm: Normal rate and regular rhythm.     Heart sounds: Normal heart sounds.  Pulmonary:     Effort: Pulmonary effort is normal. Tachypnea present. No respiratory distress.     Breath sounds: Normal breath sounds.  Abdominal:     Palpations: Abdomen is soft.     Tenderness: There is no abdominal tenderness.  Musculoskeletal:     Cervical back: Normal range of motion and neck supple.  Skin:    General: Skin is dry.     Comments: Hot to touch  Neurological:     Mental Status: He is alert.  Psychiatric:        Behavior: Behavior normal.     ED Results / Procedures / Treatments   Labs (all labs ordered are listed, but only abnormal results are displayed) Labs Reviewed  BASIC METABOLIC PANEL - Abnormal; Notable for the following components:      Result Value   Potassium 3.3 (*)    Glucose, Bld 101 (*)    Anion gap 17 (*)    All other components within normal limits  CBC - Abnormal; Notable for the following components:   WBC 13.9 (*)    All other components within normal limits  TROPONIN I (HIGH SENSITIVITY) - Abnormal; Notable for the following components:   Troponin I (High Sensitivity) 23 (*)    All other components within normal limits  SARS CORONAVIRUS 2 BY  RT PCR (HOSPITAL ORDER, PERFORMED IN [facility])  TROPONIN I (HIGH SENSITIVITY)    EKG None  Radiology DG Chest 2 View  Result Date: 09/24/2019 CLINICAL DATA:  Chest pain. EXAM: CHEST - 2 VIEW COMPARISON:  October 20, 2018 FINDINGS: The heart, hila, and mediastinum are normal. No pneumothorax. No nodules or masses. No focal infiltrates. IMPRESSION: No active cardiopulmonary disease. Electronically Signed   By: III  M.D   On: 09/24/2019 19:28    Procedures Procedures (including critical care time)  Medications Ordered in ED Medications  sodium chloride flush (NS) 0.9 % injection 3 mL (3 mLs Intravenous Not Given 09/24/19 2012)  sodium chloride 0.9 % bolus 1,000 mL (has no administration in time range)  ibuprofen (ADVIL) tablet 800 mg (has no administration in time range)    ED Course  I have reviewed the triage vital signs and the nursing notes.  Pertinent labs & imaging results that were available during my care of the patient were reviewed by me and considered in my medical decision making (see chart for details).  Clinical Course as of Sep 23 2204  Wynelle Link Sep 24, 2019  2206 WBC(!): 13.9 [AH]    Clinical Course User Index [AH] Arthor Captain, PA-C   MDM Rules/Calculators/A&P                      CC:cp/sob, flu like sxs VS: Patient markedly hypertensive, persistently tachycardic with borderline fever PJ:[id number] is gathered by patient and wife at bedside. Previous records obtained and reviewed. DDX:The patient's complaint of chest pain and shortness of breath involves an extensive number of diagnostic and treatment options, and is a complaint that carries with it a high risk of complications, morbidity, and potential mortality. Given the large differential diagnosis, medical decision making is of high complexity. The emergent differential diagnosis for shortness of breath includes, but is not limited to, Pulmonary edema, bronchoconstriction, Pneumonia,  Pulmonary embolism, Pneumotherax/ Hemothorax, Dysrythmia, ACS.   Labs: I ordered reviewed and interpreted labs which include troponin which is elevated x2, CBC which is elevated white blood cell count of 13.9 thousand, CBC which shows mildly low potassium of 3.3 lactate within normal limits, Covid test negative Imaging: I ordered and reviewed images which included 2 view chest x-ray. I independently visualized and interpreted all imaging. There are no acute, significant findings on today's images. EKG: Sinus tachycardia at a rate of 112 with LVH Consults: Dr. Toniann Fail MDM: Patient here with flulike symptoms, chest pain, marked exertional dyspnea and dyspnea at rest.  He has a history of alcohol abuse, untreated hypertension.  Patient also had elevated troponin levels.  I have concern for potential viral myocarditis as patient's Covid testing is negative.  The case was discussed with Dr. Toniann Fail has asked me to place an order for CT angiogram to rule out pulmonary embolus.  He will follow up on these results.  Patient will be admitted to the hospitalist service. Patient disposition: Admit The patient appears reasonably stabilized for admission considering the current resources, flow, and capabilities available in the ED at this time, and I doubt any other [facility] requiring further screening and/or treatment in the ED prior to admission.    SHELTON SQUARE was evaluated in Emergency Department on 09/25/2019 for the symptoms described in the history of present illness. He was evaluated in the context of the global COVID-19 pandemic, which necessitated consideration that the patient might be at risk for infection with the SARS-CoV-2 virus that causes COVID-19. Institutional protocols and algorithms that pertain to the evaluation of patients at risk for COVID-19 are in a state of rapid change based on information released by regulatory bodies including the CDC and federal and state organizations. These policies  and algorithms were followed during the patient's care in the ED.    Final Clinical Impression(s) / ED Diagnoses Final diagnoses:  None    Rx / DC Orders ED  Discharge Orders    None       Arthor Captain, PA-C 09/25/19 1159    Jacalyn Lefevre, MD 09/28/19 1756

## 2019-09-25 ENCOUNTER — Encounter (HOSPITAL_COMMUNITY): Payer: Self-pay | Admitting: Internal Medicine

## 2019-09-25 ENCOUNTER — Encounter (HOSPITAL_COMMUNITY)
Admission: EM | Disposition: A | Discharge status: Home / Self Care | Payer: Self-pay | Source: Home / Self Care | Attending: Internal Medicine

## 2019-09-25 ENCOUNTER — Inpatient Hospital Stay (HOSPITAL_COMMUNITY): Payer: Self-pay

## 2019-09-25 DIAGNOSIS — R079 Chest pain, unspecified: Secondary | ICD-10-CM | POA: Diagnosis present

## 2019-09-25 DIAGNOSIS — R778 Other specified abnormalities of plasma proteins: Secondary | ICD-10-CM

## 2019-09-25 DIAGNOSIS — I340 Nonrheumatic mitral (valve) insufficiency: Secondary | ICD-10-CM

## 2019-09-25 DIAGNOSIS — I361 Nonrheumatic tricuspid (valve) insufficiency: Secondary | ICD-10-CM

## 2019-09-25 DIAGNOSIS — W57XXXA Bitten or stung by nonvenomous insect and other nonvenomous arthropods, initial encounter: Secondary | ICD-10-CM | POA: Diagnosis present

## 2019-09-25 DIAGNOSIS — I160 Hypertensive urgency: Secondary | ICD-10-CM | POA: Diagnosis present

## 2019-09-25 DIAGNOSIS — I351 Nonrheumatic aortic (valve) insufficiency: Secondary | ICD-10-CM

## 2019-09-25 DIAGNOSIS — I428 Other cardiomyopathies: Secondary | ICD-10-CM

## 2019-09-25 DIAGNOSIS — R6510 Systemic inflammatory response syndrome (SIRS) of non-infectious origin without acute organ dysfunction: Secondary | ICD-10-CM

## 2019-09-25 HISTORY — PX: LEFT HEART CATH AND CORONARY ANGIOGRAPHY: CATH118249

## 2019-09-25 LAB — BASIC METABOLIC PANEL
Anion gap: 15 (ref 5–15)
Anion gap: 12 (ref 5–15)
BUN: 7 mg/dL (ref 6–20)
BUN: 6 mg/dL (ref 6–20)
CO2: 24 mmol/L (ref 22–32)
CO2: 25 mmol/L (ref 22–32)
Calcium: 8.4 mg/dL — ABNORMAL LOW (ref 8.9–10.3)
Calcium: 8.6 mg/dL — ABNORMAL LOW (ref 8.9–10.3)
Chloride: 100 mmol/L (ref 98–111)
Chloride: 100 mmol/L (ref 98–111)
Creatinine, Ser: 1.02 mg/dL (ref 0.61–1.24)
Creatinine, Ser: 0.91 mg/dL (ref 0.61–1.24)
GFR calc Af Amer: >60 mL/min (ref >60)
GFR calc Af Amer: >60 mL/min (ref >60)
GFR calc non Af Amer: >60 mL/min (ref >60)
GFR calc non Af Amer: >60 mL/min (ref >60)
Glucose, Bld: 107 mg/dL — ABNORMAL HIGH (ref 70–99)
Glucose, Bld: 112 mg/dL — ABNORMAL HIGH (ref 70–99)
Potassium: 2.9 mmol/L — ABNORMAL LOW (ref 3.5–5.1)
Potassium: 3.1 mmol/L — ABNORMAL LOW (ref 3.5–5.1)
Sodium: 139 mmol/L (ref 135–145)
Sodium: 137 mmol/L (ref 135–145)

## 2019-09-25 LAB — CBC WITH DIFFERENTIAL/PLATELET
Abs Immature Granulocytes: 0.05 10*3/uL (ref 0.00–0.07)
Basophils Absolute: 0.1 10*3/uL (ref 0.0–0.1)
Basophils Relative: 1 %
Eosinophils Absolute: 0.1 10*3/uL (ref 0.0–0.5)
Eosinophils Relative: 1 %
HCT: 47.2 % (ref 39.0–52.0)
Hemoglobin: 15.8 g/dL (ref 13.0–17.0)
Immature Granulocytes: 0 %
Lymphocytes Relative: 23 %
Lymphs Abs: 2.9 10*3/uL (ref 0.7–4.0)
MCH: 32.8 pg (ref 26.0–34.0)
MCHC: 33.5 g/dL (ref 30.0–36.0)
MCV: 97.9 fL (ref 80.0–100.0)
Monocytes Absolute: 0.6 10*3/uL (ref 0.1–1.0)
Monocytes Relative: 5 %
Neutro Abs: 9 10*3/uL — ABNORMAL HIGH (ref 1.7–7.7)
Neutrophils Relative %: 70 %
Platelets: 247 10*3/uL (ref 150–400)
RBC: 4.82 MIL/uL (ref 4.22–5.81)
RDW: 14.2 % (ref 11.5–15.5)
WBC: 12.7 10*3/uL — ABNORMAL HIGH (ref 4.0–10.5)
nRBC: 0 % (ref 0.0–0.2)

## 2019-09-25 LAB — TROPONIN I (HIGH SENSITIVITY)
Troponin I (High Sensitivity): 31 ng/L — ABNORMAL HIGH (ref <18)
Troponin I (High Sensitivity): 26 ng/L — ABNORMAL HIGH (ref <18)
Troponin I (High Sensitivity): 28 ng/L — ABNORMAL HIGH (ref <18)

## 2019-09-25 LAB — HEPATIC FUNCTION PANEL
ALT: 158 U/L — ABNORMAL HIGH (ref 0–44)
AST: 147 U/L — ABNORMAL HIGH (ref 15–41)
Albumin: 3.5 g/dL (ref 3.5–5.0)
Alkaline Phosphatase: 98 U/L (ref 38–126)
Bilirubin, Direct: 0.5 mg/dL — ABNORMAL HIGH (ref 0.0–0.2)
Indirect Bilirubin: 2.1 mg/dL — ABNORMAL HIGH (ref 0.3–0.9)
Total Bilirubin: 2.6 mg/dL — ABNORMAL HIGH (ref 0.3–1.2)
Total Protein: 7.2 g/dL (ref 6.5–8.1)

## 2019-09-25 LAB — RAPID URINE DRUG SCREEN, HOSP PERFORMED
Amphetamines: NOT DETECTED
Amphetamines: NOT DETECTED
Barbiturates: NOT DETECTED
Barbiturates: NOT DETECTED
Benzodiazepines: NOT DETECTED
Benzodiazepines: NOT DETECTED
Cocaine: NOT DETECTED
Cocaine: NOT DETECTED
Opiates: POSITIVE — AB
Opiates: POSITIVE — AB
Tetrahydrocannabinol: POSITIVE — AB
Tetrahydrocannabinol: POSITIVE — AB

## 2019-09-25 LAB — URINALYSIS, ROUTINE W REFLEX MICROSCOPIC
Bilirubin Urine: NEGATIVE
Glucose, UA: NEGATIVE mg/dL
Hgb urine dipstick: NEGATIVE
Ketones, ur: 20 mg/dL — AB
Leukocytes,Ua: NEGATIVE
Nitrite: NEGATIVE
Protein, ur: NEGATIVE mg/dL
Specific Gravity, Urine: 1.011 (ref 1.005–1.030)
pH: 8 (ref 5.0–8.0)

## 2019-09-25 LAB — CK: Total CK: 267 U/L (ref 49–397)

## 2019-09-25 LAB — ECHOCARDIOGRAM COMPLETE
Height: 74 in
Weight: 3680 oz

## 2019-09-25 LAB — TSH: TSH: 2.911 u[IU]/mL (ref 0.350–4.500)

## 2019-09-25 LAB — C-REACTIVE PROTEIN: CRP: 2.7 mg/dL — ABNORMAL HIGH (ref <1.0)

## 2019-09-25 LAB — SEDIMENTATION RATE: Sed Rate: 5 mm/hr (ref 0–16)

## 2019-09-25 LAB — MAGNESIUM: Magnesium: 1.4 mg/dL — ABNORMAL LOW (ref 1.7–2.4)

## 2019-09-25 LAB — PROCALCITONIN: Procalcitonin: <0.1 ng/mL

## 2019-09-25 LAB — LIPASE, BLOOD: Lipase: 37 U/L (ref 11–51)

## 2019-09-25 SURGERY — LEFT HEART CATH AND CORONARY ANGIOGRAPHY
Anesthesia: LOCAL

## 2019-09-25 MED ORDER — SODIUM CHLORIDE 0.9 % IV SOLN: INTRAVENOUS | Status: AC

## 2019-09-25 MED ORDER — ONDANSETRON HCL 4 MG PO TABS: 4.0000 mg | ORAL_TABLET | Freq: Four times a day (QID) | ORAL | Status: DC | PRN

## 2019-09-25 MED ORDER — LIDOCAINE HCL (PF) 1 % IJ SOLN
INTRAMUSCULAR | Status: DC | PRN
  Administered 2019-09-25: 2 mL

## 2019-09-25 MED ORDER — HYDRALAZINE HCL 20 MG/ML IJ SOLN
10.0000 mg | INTRAMUSCULAR | Status: DC | PRN
  Administered 2019-09-25 (×2): 10 mg via INTRAVENOUS
  Filled 2019-09-25 (×2): qty 1

## 2019-09-25 MED ORDER — IOHEXOL 350 MG/ML SOLN
INTRAVENOUS | Status: DC | PRN
  Administered 2019-09-25: 50 mL via INTRA_ARTERIAL

## 2019-09-25 MED ORDER — ADULT MULTIVITAMIN W/MINERALS CH
1.0000 | ORAL_TABLET | Freq: Every day | ORAL | Status: DC
  Administered 2019-09-25 – 2019-09-28 (×4): 1 via ORAL
  Filled 2019-09-25 (×4): qty 1

## 2019-09-25 MED ORDER — AMLODIPINE BESYLATE 5 MG PO TABS
5.0000 mg | ORAL_TABLET | Freq: Every day | ORAL | Status: DC
  Administered 2019-09-25: 5 mg via ORAL

## 2019-09-25 MED ORDER — FENTANYL CITRATE (PF) 100 MCG/2ML IJ SOLN
INTRAMUSCULAR | Status: DC | PRN
  Administered 2019-09-25: 25 ug via INTRAVENOUS

## 2019-09-25 MED ORDER — VERAPAMIL HCL 2.5 MG/ML IV SOLN
INTRA_ARTERIAL | Status: DC | PRN
  Administered 2019-09-25: 5 mL via INTRA_ARTERIAL

## 2019-09-25 MED ORDER — VANCOMYCIN HCL 1500 MG/300ML IV SOLN
1500.0000 mg | Freq: Two times a day (BID) | INTRAVENOUS | Status: DC
  Filled 2019-09-25 (×2): qty 300

## 2019-09-25 MED ORDER — LIDOCAINE HCL (PF) 1 % IJ SOLN
INTRAMUSCULAR | Status: AC
  Filled 2019-09-25: qty 30

## 2019-09-25 MED ORDER — POTASSIUM CHLORIDE CRYS ER 20 MEQ PO TBCR
40.0000 meq | EXTENDED_RELEASE_TABLET | Freq: Once | ORAL | Status: AC
  Administered 2019-09-25: 40 meq via ORAL
  Filled 2019-09-25: qty 2

## 2019-09-25 MED ORDER — ASPIRIN 81 MG PO CHEW
243.0000 mg | CHEWABLE_TABLET | ORAL | Status: AC
  Administered 2019-09-25: 243 mg via ORAL
  Filled 2019-09-25: qty 3

## 2019-09-25 MED ORDER — MIDAZOLAM HCL 2 MG/2ML IJ SOLN
INTRAMUSCULAR | Status: DC | PRN
  Administered 2019-09-25: 1 mg via INTRAVENOUS

## 2019-09-25 MED ORDER — MORPHINE SULFATE (PF) 2 MG/ML IV SOLN: 1.0000 mg | INTRAVENOUS | Status: DC | PRN

## 2019-09-25 MED ORDER — FENTANYL CITRATE (PF) 100 MCG/2ML IJ SOLN
INTRAMUSCULAR | Status: AC
  Filled 2019-09-25: qty 2

## 2019-09-25 MED ORDER — ACETAMINOPHEN 325 MG PO TABS: 650.0000 mg | ORAL_TABLET | ORAL | Status: DC | PRN

## 2019-09-25 MED ORDER — POTASSIUM CHLORIDE CRYS ER 20 MEQ PO TBCR
40.0000 meq | EXTENDED_RELEASE_TABLET | ORAL | Status: DC
  Administered 2019-09-25: 40 meq via ORAL
  Filled 2019-09-25: qty 2

## 2019-09-25 MED ORDER — THIAMINE HCL 100 MG PO TABS
100.0000 mg | ORAL_TABLET | Freq: Every day | ORAL | Status: DC
  Administered 2019-09-25 – 2019-09-28 (×3): 100 mg via ORAL
  Filled 2019-09-25 (×3): qty 1

## 2019-09-25 MED ORDER — IBUPROFEN 800 MG PO TABS
800.0000 mg | ORAL_TABLET | Freq: Three times a day (TID) | ORAL | Status: DC
  Administered 2019-09-25 – 2019-09-26 (×2): 800 mg via ORAL
  Filled 2019-09-25 (×2): qty 1

## 2019-09-25 MED ORDER — SODIUM CHLORIDE 0.9% FLUSH
3.0000 mL | Freq: Two times a day (BID) | INTRAVENOUS | Status: DC
  Administered 2019-09-26: 3 mL via INTRAVENOUS

## 2019-09-25 MED ORDER — SODIUM CHLORIDE 0.9% FLUSH
3.0000 mL | Freq: Once | INTRAVENOUS | Status: AC
  Administered 2019-09-25: 3 mL via INTRAVENOUS

## 2019-09-25 MED ORDER — CARVEDILOL 6.25 MG PO TABS
6.2500 mg | ORAL_TABLET | Freq: Two times a day (BID) | ORAL | Status: DC
  Administered 2019-09-25 – 2019-09-26 (×2): 6.25 mg via ORAL
  Filled 2019-09-25 (×2): qty 1

## 2019-09-25 MED ORDER — ONDANSETRON HCL 4 MG/2ML IJ SOLN: 4.0000 mg | Freq: Four times a day (QID) | INTRAMUSCULAR | Status: DC | PRN

## 2019-09-25 MED ORDER — HYDRALAZINE HCL 20 MG/ML IJ SOLN: 10.0000 mg | INTRAMUSCULAR | Status: DC | PRN

## 2019-09-25 MED ORDER — SODIUM CHLORIDE 0.9 % IV SOLN: 250.0000 mL | INTRAVENOUS | Status: DC | PRN

## 2019-09-25 MED ORDER — SACUBITRIL-VALSARTAN 24-26 MG PO TABS
1.0000 | ORAL_TABLET | Freq: Two times a day (BID) | ORAL | Status: DC
  Administered 2019-09-25: 1 via ORAL
  Filled 2019-09-25: qty 1

## 2019-09-25 MED ORDER — HEPARIN (PORCINE) IN NACL 1000-0.9 UT/500ML-% IV SOLN
INTRAVENOUS | Status: DC | PRN
  Administered 2019-09-25 (×2): 500 mL

## 2019-09-25 MED ORDER — IBUPROFEN 800 MG PO TABS
800.0000 mg | ORAL_TABLET | Freq: Three times a day (TID) | ORAL | Status: DC
  Administered 2019-09-25: 800 mg via ORAL
  Filled 2019-09-25: qty 1

## 2019-09-25 MED ORDER — LORAZEPAM 1 MG PO TABS: 1.0000 mg | ORAL_TABLET | ORAL | Status: DC | PRN

## 2019-09-25 MED ORDER — HEPARIN SODIUM (PORCINE) 1000 UNIT/ML IJ SOLN
INTRAMUSCULAR | Status: AC
  Filled 2019-09-25: qty 1

## 2019-09-25 MED ORDER — ASPIRIN 81 MG PO CHEW: 81.0000 mg | CHEWABLE_TABLET | Freq: Every day | ORAL | Status: DC

## 2019-09-25 MED ORDER — HEPARIN (PORCINE) IN NACL 1000-0.9 UT/500ML-% IV SOLN
INTRAVENOUS | Status: AC
  Filled 2019-09-25: qty 1000

## 2019-09-25 MED ORDER — SODIUM CHLORIDE 0.9% FLUSH
3.0000 mL | Freq: Two times a day (BID) | INTRAVENOUS | Status: DC
  Administered 2019-09-26 – 2019-09-28 (×4): 3 mL via INTRAVENOUS

## 2019-09-25 MED ORDER — VANCOMYCIN HCL 1500 MG/300ML IV SOLN
1500.0000 mg | Freq: Two times a day (BID) | INTRAVENOUS | Status: DC
  Administered 2019-09-25 – 2019-09-26 (×2): 1500 mg via INTRAVENOUS
  Filled 2019-09-25 (×3): qty 300

## 2019-09-25 MED ORDER — MORPHINE SULFATE (PF) 2 MG/ML IV SOLN
1.0000 mg | INTRAVENOUS | Status: DC | PRN
  Administered 2019-09-25 (×4): 1 mg via INTRAVENOUS
  Filled 2019-09-25 (×4): qty 1

## 2019-09-25 MED ORDER — SODIUM CHLORIDE 0.9 % IV SOLN: INTRAVENOUS | Status: DC

## 2019-09-25 MED ORDER — SODIUM CHLORIDE 0.9% FLUSH: 3.0000 mL | INTRAVENOUS | Status: DC | PRN

## 2019-09-25 MED ORDER — MORPHINE SULFATE (PF) 2 MG/ML IV SOLN
2.0000 mg | INTRAVENOUS | Status: DC | PRN
  Administered 2019-09-25: 2 mg via INTRAVENOUS
  Filled 2019-09-25 (×2): qty 1

## 2019-09-25 MED ORDER — LABETALOL HCL 5 MG/ML IV SOLN: 10.0000 mg | INTRAVENOUS | Status: AC | PRN

## 2019-09-25 MED ORDER — HEPARIN SODIUM (PORCINE) 1000 UNIT/ML IJ SOLN
INTRAMUSCULAR | Status: DC | PRN
  Administered 2019-09-25: 5000 [IU] via INTRAVENOUS

## 2019-09-25 MED ORDER — MAGNESIUM SULFATE 4 GM/100ML IV SOLN
4.0000 g | Freq: Once | INTRAVENOUS | Status: AC
  Administered 2019-09-25: 4 g via INTRAVENOUS
  Filled 2019-09-25: qty 100

## 2019-09-25 MED ORDER — ASPIRIN EC 81 MG PO TBEC
81.0000 mg | DELAYED_RELEASE_TABLET | Freq: Every day | ORAL | Status: DC
  Administered 2019-09-25 – 2019-09-28 (×4): 81 mg via ORAL
  Filled 2019-09-25 (×4): qty 1

## 2019-09-25 MED ORDER — VERAPAMIL HCL 2.5 MG/ML IV SOLN
INTRAVENOUS | Status: AC
  Filled 2019-09-25: qty 2

## 2019-09-25 MED ORDER — SODIUM CHLORIDE 0.9 % IV SOLN
2.0000 g | INTRAVENOUS | Status: DC
  Administered 2019-09-25: 2 g via INTRAVENOUS
  Filled 2019-09-25 (×2): qty 20

## 2019-09-25 MED ORDER — SODIUM CHLORIDE 0.9% FLUSH: 3.0000 mL | Freq: Two times a day (BID) | INTRAVENOUS | Status: DC

## 2019-09-25 MED ORDER — HYDRALAZINE HCL 20 MG/ML IJ SOLN: 10.0000 mg | INTRAMUSCULAR | Status: AC | PRN

## 2019-09-25 MED ORDER — THIAMINE HCL 100 MG/ML IJ SOLN
100.0000 mg | Freq: Every day | INTRAMUSCULAR | Status: DC
  Administered 2019-09-26: 100 mg via INTRAVENOUS
  Filled 2019-09-25: qty 2

## 2019-09-25 MED ORDER — MIDAZOLAM HCL 2 MG/2ML IJ SOLN
INTRAMUSCULAR | Status: AC
  Filled 2019-09-25: qty 2

## 2019-09-25 MED ORDER — LORAZEPAM 2 MG/ML IJ SOLN: 1.0000 mg | INTRAMUSCULAR | Status: DC | PRN

## 2019-09-25 MED ORDER — SODIUM CHLORIDE 0.9 % IV SOLN
2.0000 g | INTRAVENOUS | Status: DC
  Administered 2019-09-26: 2 g via INTRAVENOUS
  Filled 2019-09-25: qty 2

## 2019-09-25 MED ORDER — VANCOMYCIN HCL IN DEXTROSE 1-5 GM/200ML-% IV SOLN
1000.0000 mg | INTRAVENOUS | Status: AC
  Administered 2019-09-25 (×2): 1000 mg via INTRAVENOUS
  Filled 2019-09-25 (×2): qty 200

## 2019-09-25 MED ORDER — FOLIC ACID 1 MG PO TABS
1.0000 mg | ORAL_TABLET | Freq: Every day | ORAL | Status: DC
  Administered 2019-09-25 – 2019-09-26 (×2): 1 mg via ORAL
  Filled 2019-09-25 (×2): qty 1

## 2019-09-25 MED ORDER — POTASSIUM CHLORIDE CRYS ER 20 MEQ PO TBCR
20.0000 meq | EXTENDED_RELEASE_TABLET | Freq: Once | ORAL | Status: AC
  Administered 2019-09-25: 20 meq via ORAL
  Filled 2019-09-25: qty 1

## 2019-09-25 SURGICAL SUPPLY — 11 items
CATH INFINITI JR4 5F (CATHETERS) ×1 IMPLANT
CATH OPTITORQUE TIG 4.0 5F (CATHETERS) ×1 IMPLANT
DEVICE RAD COMP TR BAND LRG (VASCULAR PRODUCTS) ×1 IMPLANT
GLIDESHEATH SLEND A-KIT 6F 22G (SHEATH) ×1 IMPLANT
GUIDEWIRE INQWIRE 1.5J.035X260 (WIRE) IMPLANT
INQWIRE 1.5J .035X260CM (WIRE) ×2
KIT HEART LEFT (KITS) ×2 IMPLANT
PACK CARDIAC CATHETERIZATION (CUSTOM PROCEDURE TRAY) ×2 IMPLANT
TRANSDUCER W/STOPCOCK (MISCELLANEOUS) ×2 IMPLANT
TUBING CIL FLEX 10 FLL-RA (TUBING) ×2 IMPLANT
WIRE HI TORQ VERSACORE-J 145CM (WIRE) ×1 IMPLANT

## 2019-09-25 NOTE — Progress Notes (Addendum)
Wife Marylene Land arrived at bedside and had questions about patient's plan of care. Per nurse, pt gave permission to speak with her. Spoke with her via phone and answered her questions.  She would like call after patient's heart cath at [phone].  Cath lab team ordered a repeat BMET this afternoon given his low potassium level on original BMET. K resulted at 3.1 but this was drawn only shortly after patient got dose of KCl. He has another dose ordered this afternoon.   Addendum: Mg level resulted 1.4 - will order Mag Sulfate 4gm with f/u level in AM. Suspect related to ETOH. May need standing magnesium after discharge.  Christen Bedoya PA-C

## 2019-09-25 NOTE — H&P (View-Only) (Signed)
Cardiology Consultation:   Patient ID: Derrick Hoffman MRN: [id number]; DOB: [birth year]  Admit date: 09/24/2019 Date of Consult: 09/25/2019  Primary Care Provider: Patient, No Pcp Per Primary Cardiologist: New to Dr. Johnsie Cancel Primary Electrophysiologist:  None   Patient Profile:   Derrick Hoffman is a 44 y.o. male with a hx of HTN, tobacco abuse, habitual ETOH abuse, abnormal LFTs, diffuse hepatic steatosis by imaging, aortic atherosclerosis by CT 05/2019, hypokalemia who is being seen today for the evaluation of chest pain, abnormal EKG and elevated troponin at the request of Dr. Verlon Au.  History of Present Illness:   Mr. Careaga denies any prior cardiac history. His mother had an MI at age 34. He was seen in the ED 4/14 for cellulitis following a tick bite. He reports he took about 12 days of doxycycline and felt normal otherwise without any systemic symptoms.   On Saturday 5/22 he noticed he was a little more SOB running around with his kids but did not think much of it until he began having CP on Sunday (yesterday). Yesterday morning, he developed persistent substernal pressure-like chest pain associated with SOB and DOE. He also had chills and feeling hot. He thought he was having a heart attack. He initially thought it may be due to anxiety so laid low but the pain persisted, prompting him to seek care yesterday afternoon. In the ED he was found to be markedly hypertensive at 181/125. He has not taken amlodipine for about a week. He was febrile to 100.1. CXR NAD. CTA showed no evidence of PE, + trace bilateral pleural effusions, hepatic steatosis, visualized aorta was normal. Labs show leukocytosis with WBC 13.9, hypokalemia 3.3->2.9, hsTroponin 23 -> 24 -> 31 -> 26, normal CK, CPR 2.7, lactic acid normal, ESR wnl, UDS + opiates/THC, AST 147/ALT 158, Tbili 2.1, Covid test negative, procalcitonin wnl. He has not had symptoms like this before. The pain is not worse with inspiration, movement, palpation  or recumbency - seems to be present no matter what he does. He has received both ibuprofen and morphine for his chest pain. Ibuprofen did not help. Morphine will bring it down slightly but then it goes back up. He rates it an 8/10 right now, similar to the level he's been feeling all along. No other specific localizing or URI symptoms. He denies n/v, headache or rash. No edema, orthopnea, syncope. He does not plan to get the Covid vaccine. UDS + THC/opiates, negative cocaine. He drinks 1 pint of ETOH about 3-4x week.  2D echo just returned and shows EF 25-30% with severe hypokinesis of the left ventricular, entire anteroseptal wall, severe akinesis of the left ventricular, mid anteroseptal wall, RV function mildly reduced, mild MR, grade 2 DD. He has been resumed on home amlodipine and getting PRN IV hydralazine. He is also on empiric antibiotics, pending Lyme/RMSF titers and blood cultures.  He does not recall any specific major episodes of CP in the preceding weeks. When asked about stress he does indicate there is a lot in his life. He lost 3 family members in April and also reports male problems, declines to go into further details.  Past Medical History:  Diagnosis Date   Abnormal liver function test    Anxiety    Aortic atherosclerosis (HCC)    Childhood asthma    Habitual alcohol use    Hepatic steatosis    History of kidney stones 1990   Hypertension    Hypokalemia    Tobacco abuse  Past Surgical History:  Procedure Laterality Date   ANTERIOR CRUCIATE LIGAMENT REPAIR Right 1996   APPENDECTOMY  01/05/2018   BUNIONECTOMY Left 2013   LAPAROSCOPIC APPENDECTOMY N/A 01/05/2018   Procedure: APPENDECTOMY LAPAROSCOPIC;  Surgeon: Jovita Kussmaul, MD;  Location: [facility];  Service: General;  Laterality: N/A;   ORIF TRIPOD FRACTURE N/A 11/03/2018   Procedure: OPEN REDUCTION INTERNAL FIXATION (ORIF) TRIPOD FRACTURE;  Surgeon: Melissa Montane, MD;  Location: [facility];  Service: ENT;  Laterality: N/A;   ORIF tripod fracture- malar fracture     Home Medications:  Prior to Admission medications   Medication Sig Start Date End Date Taking? Authorizing Provider  amLODipine (NORVASC) 5 MG tablet Take 1 tablet (5 mg total) by mouth daily. 08/16/19  Yes Jaynee Eagles, PA-C  doxycycline (VIBRAMYCIN) 100 MG capsule Take 1 capsule (100 mg total) by mouth 2 (two) times daily. Patient not taking: Reported on 09/25/2019 08/16/19   Jaynee Eagles, PA-C  naproxen (NAPROSYN) 500 MG tablet Take 1 tablet (500 mg total) by mouth 2 (two) times daily. Patient not taking: Reported on 09/25/2019 08/16/19   Jaynee Eagles, PA-C  omeprazole (PRILOSEC) 40 MG capsule Take 1 capsule (40 mg total) by mouth daily for 20 days. Patient not taking: Reported on 05/25/2019 06/25/18 11/03/18  Orson Aloe, MD  ondansetron (ZOFRAN ODT) 4 MG disintegrating tablet Take 1 tablet (4 mg total) by mouth every 8 (eight) hours as needed for nausea or vomiting. Patient not taking: Reported on 09/25/2019 05/25/19   Nuala Alpha A, PA-C  oxyCODONE (OXY IR/ROXICODONE) 5 MG immediate release tablet Take 1-2 tablets (5-10 mg total) by mouth every 6 (six) hours as needed for moderate pain or severe pain (56m for moderate pain, 189mfor severe pain). Patient not taking: Reported on 06/25/2018 01/06/18   JoNorm ParcelPA-C  oxyCODONE-acetaminophen (PERCOCET/ROXICET) 5-325 MG tablet Take 1 tablet by mouth every 6 (six) hours as needed for severe pain. Patient not taking: Reported on 05/25/2019 10/21/18   HaLorin GlassPA-C  potassium chloride (KLOR-CON) 10 MEQ tablet Take 2 tablets (20 mEq total) by mouth daily for 3 days. Patient not taking: Reported on 09/25/2019 05/25/19 05/28/19  MoNuala Alpha, PA-C  sucralfate (CARAFATE) 1 g tablet Take 1 tablet (1 g total) by mouth 4 (four) times daily -  with meals and at bedtime for 10 days. Patient not taking: Reported on 09/18/2018 06/25/18 07/05/18  OcOrson AloeMD    Inpatient Medications: Scheduled  Meds:  amLODipine  5 mg Oral Daily   aspirin EC  81 mg Oral Daily   folic acid  1 mg Oral Daily   ibuprofen  800 mg Oral TID   multivitamin with minerals  1 tablet Oral Daily   potassium chloride  40 mEq Oral Q4H   sodium chloride flush  3 mL Intravenous Once   thiamine  100 mg Oral Daily   Or   thiamine  100 mg Intravenous Daily   Continuous Infusions:  cefTRIAXone (ROCEPHIN)  IV Stopped (09/25/19 0644)   vancomycin     PRN Meds: hydrALAZINE, LORazepam **OR** LORazepam, morphine injection, ondansetron **OR** ondansetron (ZOFRAN) IV  Allergies:    Allergies  Allergen Reactions   Penicillins Swelling    Did it involve swelling of the face/tongue/throat, SOB, or low BP? Y Did it involve sudden or severe rash/hives, skin peeling, or any reaction on the inside of your mouth or nose? N Did you need to seek medical attention at a hospital or doctor's  office? Y When did it last happen? 10 years If all above answers are "NO", may proceed with cephalosporin use.    Social History:   Social History   Socioeconomic History   Marital status: Married    Spouse name: Not on file   Number of children: Not on file   Years of education: Not on file   Highest education level: Not on file  Occupational History   Not on file  Tobacco Use   Smoking status: Current Every Day Smoker    Packs/day: 0.50    Years: 1.00    Pack years: 0.50   Smokeless tobacco: Never Used  Substance and Sexual Activity   Alcohol use: Yes    Alcohol/week: 48.0 standard drinks    Types: 48 Shots of liquor per week    Comment: 01/05/2018 " 5th of liquor/day;  3 days/wk   Drug use: Not Currently   Sexual activity: Not Currently  Other Topics Concern   Not on file  Social History Narrative   Not on file   Social Determinants of Health   Financial Resource Strain:    Difficulty of Paying Living Expenses:   Food Insecurity:    Worried About Charity fundraiser in the Last Year:    Arboriculturist in the  Last Year:   Transportation Needs:    Film/video editor (Medical):    Lack of Transportation (Non-Medical):   Physical Activity:    Days of Exercise per Week:    Minutes of Exercise per Session:   Stress:    Feeling of Stress :   Social Connections:    Frequency of Communication with Friends and Family:    Frequency of Social Gatherings with Friends and Family:    Attends Religious Services:    Active Member of Clubs or Organizations:    Attends Music therapist:    Marital Status:   Intimate Partner Violence:    Fear of Current or Ex-Partner:    Emotionally Abused:    Physically Abused:    Sexually Abused:     Family History:   Family History  Problem Relation Age of Onset   CAD Mother        MI age 5     ROS:  Please see the history of present illness.  All other ROS reviewed and negative.     Physical Exam/Data:   Vitals:   09/25/19 0900 09/25/19 1000 09/25/19 1045 09/25/19 1145  BP: (!) 167/113 (!) 161/102  (!) 164/119  Pulse: (!) 101 97 (!) 101 96  Resp: 19 20  16   Temp:      TempSrc:      SpO2: 97% 99% 97% 98%  Weight:      Height:        Intake/Output Summary (Last 24 hours) at 09/25/2019 1204 Last data filed at 09/25/2019 1031 Gross per 24 hour  Intake 1400 ml  Output 600 ml  Net 800 ml   Last 3 Weights 09/24/2019 11/03/2018 11/01/2018  Weight (lbs) 230 lb 235 lb 7.2 oz 235 lb 8 oz  Weight (kg) 104.327 kg 106.8 kg 106.822 kg     Body mass index is 29.53 kg/m.  General: Well developed, well nourished AAM in no acute distress. Lying flat in bed without SOB. Appears comfortable Head: Normocephalic, atraumatic, sclera non-icteric, no xanthomas, nares are without discharge. Neck: Negative for carotid bruits. JVP not elevated. Lungs: Clear bilaterally to auscultation without wheezes, rales, or rhonchi.  Breathing is unlabored. Heart: RRR S1 S2, +S4, without murmurs, rubs, or gallops.  Abdomen: Soft, non-tender, non-distended with  normoactive bowel sounds. No rebound/guarding. Extremities: No clubbing or cyanosis. No edema. Distal pedal pulses are 2+ and equal bilaterally. Neuro: Alert and oriented X 3. Moves all extremities spontaneously. Psych:  Responds to questions appropriately with a normal affect.  EKG:  The EKG was personally reviewed and demonstrates:    1) 5/23 18:56: sinus tach 112bpm, left axis deivation, LVH with nonspecific TW changes, QTc 541m with QRS 1032m(NSIVCD) 2) 5/24 9:35: sinus tach 105bpm, left axis deviation, LVH with diffuse nonspecific TW changes more prominent from last tracing inferiorly with biphasic TW V3-V4 and flattening V5-V6, subtle ST elevation avL, QTc 56280mTelemetry:  Telemetry was personally reviewed and demonstrates: NSR/ST  Relevant CV Studies: N/A  Laboratory Data:  High Sensitivity Troponin:   Recent Labs  Lab 09/24/19 1912 09/24/19 2040 09/25/19 0715 09/25/19 1042  TROPONINIHS 23* 24* 31* 26*     Chemistry Recent Labs  Lab 09/24/19 1912 09/25/19 0715  NA 142 139  K 3.3* 2.9*  CL 103 100  CO2 22 24  GLUCOSE 101* 107*  BUN 9 7  CREATININE 1.04 1.02  CALCIUM 9.1 8.4*  GFRNONAA >60 >60  GFRAA >60 >60  ANIONGAP 17* 15    Recent Labs  Lab 09/25/19 0715  PROT 7.2  ALBUMIN 3.5  AST 147*  ALT 158*  ALKPHOS 98  BILITOT 2.6*   Hematology Recent Labs  Lab 09/24/19 1912 09/25/19 0715  WBC 13.9* 12.7*  RBC 5.15 4.82  HGB 17.0 15.8  HCT 50.4 47.2  MCV 97.9 97.9  MCH 33.0 32.8  MCHC 33.7 33.5  RDW 14.2 14.2  PLT 291 247   BNPNo results for input(s): BNP, PROBNP in the last 168 hours.  DDimer No results for input(s): DDIMER in the last 168 hours.   Radiology/Studies:  DG Chest 2 View  Result Date: 09/24/2019 CLINICAL DATA:  Chest pain. EXAM: CHEST - 2 VIEW COMPARISON:  October 20, 2018 FINDINGS: The heart, hila, and mediastinum are normal. No pneumothorax. No nodules or masses. No focal infiltrates. IMPRESSION: No active cardiopulmonary  disease. Electronically Signed   By: DavDorise BullionI M.D   On: 09/24/2019 19:28   CT Angio Chest PE W and/or Wo Contrast  Result Date: 09/24/2019 CLINICAL DATA:  Derrick Hoffman chest pain with shortness of breath. EXAM: CT ANGIOGRAPHY CHEST WITH CONTRAST TECHNIQUE: Multidetector CT imaging of the chest was performed using the standard protocol during bolus administration of intravenous contrast. Multiplanar CT image reconstructions and MIPs were obtained to evaluate the vascular anatomy. CONTRAST:  100m96mNIPAQUE IOHEXOL 350 MG/ML SOLN COMPARISON:  None. FINDINGS: Cardiovascular: Contrast injection is sufficient to demonstrate satisfactory opacification of the pulmonary arteries to the segmental level. There is no pulmonary embolus. The main pulmonary artery is within normal limits for size. There is no CT evidence of acute right heart strain. The visualized aorta is normal. Heart size is normal, without pericardial effusion. Mediastinum/Nodes: --No mediastinal or hilar lymphadenopathy. --No axillary lymphadenopathy. --No supraclavicular lymphadenopathy. --Normal thyroid gland. --The esophagus is unremarkable Lungs/Pleura: There are trace bilateral pleural effusions. There is no pneumothorax. No focal infiltrate. The trachea is unremarkable. Upper Abdomen: There appears to be hepatic steatosis. Otherwise, the visualized portions of the upper abdomen are unremarkable. Musculoskeletal: No chest wall abnormality. No acute or significant osseous findings. Review of the MIP images confirms the above findings. IMPRESSION: 1. No evidence of pulmonary  embolus. 2. Trace bilateral pleural effusions. 3. Hepatic steatosis. Electronically Signed   By: Constance Holster M.D.   On: 09/24/2019 23:54   ECHOCARDIOGRAM COMPLETE  Result Date: 09/25/2019    ECHOCARDIOGRAM REPORT   Patient Name:   Derrick Hoffman Sahagian Date of Exam: 09/25/2019 Medical Rec #:  [id number]     Height:       74.0 in Accession #:    [id number]     Weight:       230.0 lb Date of Birth:  1975/07/24     BSA:          2.306 m Patient Age:    85 years      BP:           161/102 mmHg Patient Gender: M             HR:           95 bpm. Exam Location:  Inpatient Procedure: 2D Echo, Cardiac Doppler and Color Doppler                     STAT ECHO Reported to: Dr. Acie Fredrickson on 09/25/2019 10:26:00 AM. Indications:    Acute coronary syndrome I24.9  History:        Patient has no prior history of Echocardiogram examinations.                 Risk Factors:Hypertension.  Sonographer:    Jonelle Sidle Dance Referring Phys: [id number] [place]  1. Left ventricular ejection fraction, by estimation, is 25 to 30%. The left ventricle has severely decreased function. The left ventricle demonstrates global hypokinesis. The left ventricular internal cavity size was moderately dilated. Left ventricular diastolic parameters are consistent with Grade II diastolic dysfunction (pseudonormalization). Elevated left ventricular end-diastolic pressure. There is severe hypokinesis of the left ventricular, entire anteroseptal wall. There is severe akinesis of the left ventricular, mid anteroseptal wall.  2. Right ventricular systolic function is mildly reduced. The right ventricular size is normal.  3. The mitral valve is grossly normal. Mild mitral valve regurgitation. No evidence of mitral stenosis.  4. The aortic valve is normal in structure. Aortic valve regurgitation is mild. No aortic stenosis is present. FINDINGS  Left Ventricle: Left ventricular ejection fraction, by estimation, is 25 to 30%. The left ventricle has severely decreased function. The left ventricle demonstrates global hypokinesis. Severe hypokinesis of the left ventricular, entire anteroseptal wall. Severe akinesis of the left ventricular, mid anteroseptal wall. The left ventricular internal cavity size was moderately dilated. There is no left ventricular hypertrophy. Left ventricular diastolic parameters are  consistent with Grade II diastolic  dysfunction (pseudonormalization). Elevated left ventricular end-diastolic pressure. Right Ventricle: The right ventricular size is normal. No increase in right ventricular wall thickness. Right ventricular systolic function is mildly reduced. Left Atrium: Left atrial size was normal in size. Right Atrium: Right atrial size was normal in size. Pericardium: There is no evidence of pericardial effusion. Mitral Valve: The mitral valve is grossly normal. Mild mitral valve regurgitation. No evidence of mitral valve stenosis. Tricuspid Valve: The tricuspid valve is normal in structure. Tricuspid valve regurgitation is mild . No evidence of tricuspid stenosis. Aortic Valve: The aortic valve is normal in structure. Aortic valve regurgitation is mild. Aortic regurgitation PHT measures 358 msec. No aortic stenosis is present. Pulmonic Valve: The pulmonic valve was grossly normal. Pulmonic valve regurgitation is trivial. No evidence of pulmonic stenosis. Aorta: The aortic root and ascending aorta are structurally normal,  with no evidence of dilitation. IAS/Shunts: The atrial septum is grossly normal.  LEFT VENTRICLE PLAX 2D LVIDd:         6.12 cm  Diastology LVIDs:         5.16 cm  LV e' lateral:   3.73 cm/s LV PW:         1.07 cm  LV E/e' lateral: 19.1 LV IVS:        0.95 cm  LV e' medial:    3.49 cm/s LVOT diam:     2.50 cm  LV E/e' medial:  20.4 LV SV:         75 LV SV Index:   33 LVOT Area:     4.91 cm  RIGHT VENTRICLE             IVC RV Basal diam:  2.78 cm     IVC diam: 1.56 cm RV S prime:     11.20 cm/s TAPSE (M-mode): 1.5 cm LEFT ATRIUM             Index       RIGHT ATRIUM           Index LA diam:        4.80 cm 2.08 cm/m  RA Area:     12.80 cm LA Vol (A2C):   91.0 ml 39.45 ml/m RA Volume:   29.60 ml  12.83 ml/m LA Vol (A4C):   58.5 ml 25.36 ml/m LA Biplane Vol: 73.9 ml 32.04 ml/m  AORTIC VALVE LVOT Vmax:   87.20 cm/s LVOT Vmean:  57.000 cm/s LVOT VTI:    0.153 m AI PHT:       358 msec  AORTA Ao Root diam: 3.80 cm Ao Asc diam:  3.60 cm MITRAL VALVE MV Area (PHT): 4.21 cm    SHUNTS MV Decel Time: 180 msec    Systemic VTI:  0.15 m MV E velocity: 71.30 cm/s  Systemic Diam: 2.50 cm MV A velocity: 56.30 cm/s MV E/A ratio:  1.27 Mertie Moores MD Electronically signed by Mertie Moores MD Signature Date/Time: 09/25/2019/11:36:37 AM    Final        HEAR Score (for undifferentiated chest pain):  HEAR Score: 4    Assessment and Plan:   1. Febrile illness of unclear etiology, also here with chest pain/SOB, mildly elevated troponin and acute systolic LV dysfunction - etiology of fever/WBC unclear, tickborne titers pending. The component of LV dysfunction raises question of myocarditis. hsTroponin seems out of proportion to acute LV dysfunction but cannot exclude underlying ACS. CTA showed no PE or acute aortic abnormality in the visualized aorta. However, he continues to have ongoing CP despite analgesics. Per d/w Dr. Johnsie Cancel, plan urgent cardiac catheterization to be done when cath lab next available. Dr. Johnsie Cancel discussed procedure with patient. Will give additional 274m ASA x 1 now to equal 327m Hold off heparin given that troponins were not significantly abnormal. Add carvedilol. Check lipids in AM - may need to consider PCSK9 in the context of liver disease/abnormal liver function. If cath is unrevealing, anticipate cardiac MRI tomorrow and consider adding ehrlichia titers as well. Does not appear overtly fluid overloaded. Will f/u LVEDP by cath.  2. QT prolongation - in context of hypokalemia and LV dysfunction. Needs cath to exclude ischemia. Replete lytes. F/u EKG in AM. Avoid QT prolonging agents.  3. Hypertensive urgency - received amlodipine this AM. Given LV dysfunction anticipate changing over to GDMT. Dr. NiJohnsie Canceluggests initiating carvedilol 6.2563mID and  Entresto 24/80m BID. TSH wnl on 5/24. Check renin/aldo level   4. Hypokalemia - etiology unclear, but has been  present in the past. He received 297m KCl this AM for K 2.9. Will give 2 more doses of 4055mx 1. Check Mg and renin/aldo level. F/u lytes in AM.  5. Polysubstance abuse - THC, ETOH, tobacco - on CIWA protocol. Cessation will be important.  For questions or updates, please contact [facility] consult [email or web address] for contact info under     Signed, DayCharlie PitterA-C  09/25/2019 12:04 PM  Patient examined chart reviewed Discussed care with nurse, PA and patient Chest pain is ongoing in ER with labile antero lateral T wave changes and increasing QT interval Troponin only 23-31.  Clinically this should be non ischemic DCM related to ? Tick bite, poorly controlled HTN or substance abuse,  Echo shows global hypokinesis with EF 25-30% but no defined RWMA. However ongoing pain with labile anterolateral  Twaves and family history suggests that diagnostic cath today is indicated given severity of LV dysfunction Exam with black male resting comfortably now. No murmur, clear lungs soft abdomen no edema. Risks of cath including stroke, contrast reaction, MI, bleeding and need for emergency surgery discussed Willing to proceed. If coronary arteries are clean may benefit from cardiac MRI  Start coreg and entresto for GDMT of cardiomyopathy.   PetJenkins Rouge FACWills Surgical Center Stadium [facility]

## 2019-09-25 NOTE — Progress Notes (Signed)
TR BAND REMOVAL  LOCATION:    Right radial  DEFLATED PER PROTOCOL:    Yes.    TIME BAND OFF / DRESSING APPLIED:    1800 pm Gauze and tegaderm applied, with coban   SITE UPON ARRIVAL:    Level 0  SITE AFTER BAND REMOVAL:    Level 0  CIRCULATION SENSATION AND MOVEMENT:    Within Normal Limits   Yes.    COMMENTS:   Care instruction given to patient.

## 2019-09-25 NOTE — Progress Notes (Signed)
Patient seen and examined and agree with plan of care as per my partner who admitted this patient at 57 this morning  44 year old black male prior appendectomy 01/05/2018 Chronic EtOH, tobacco, cannabis use Recent left tripod facial fracture Known hiatal hernia with multiple emergency room visits 08/2023/2020 nausea vomiting abdominal pain Seen in the emergency room most recently 08/16/2019 cellulitis right lower extremity?  Tick bite-Rx doxycycline counseled to follow-up with PCP  Returns as above per my partner-SIRS criteria met WBC 13 lactic acid 1.8E TC E TC  Now complaining chest pain BP (!) 167/113   Pulse (!) 101   Temp 98 F (36.7 C)   Resp 19   Ht 6' 2"  (1.88 m)   Wt 104.3 kg   SpO2 97%   BMI 29.53 kg/m  Awake slightly sleepy thick neck no icterus no pallor EOMI NCAT CTA B Chest has reproducible pain on pressure over left lower chest   DDx = viral myocarditis?? CRP ESR urinary drug screen Lyme titer [facility] spotted fever titer and other labs are pending at this time  Await echocardiogram-? colchicine versus high-dose ibuprofen and see if this makes a difference  My over read of EKG this morning = sinus tach peaked ST-T waves not different than prior   Given overall scenario will repeat EKG this morning if still abnormalities may need to involve cardiology  Verneita Griffes, MD [facility] 9:31 AM

## 2019-09-25 NOTE — ED Notes (Signed)
Tele  Breakfast Ordered 

## 2019-09-25 NOTE — Progress Notes (Signed)
Pharmacy Antibiotic Note  Derrick Hoffman is a 44 y.o. male admitted on 09/24/2019 with SOB/posible sepsis.  Pharmacy has been consulted for Vancomycin  dosing.  Plan: Vancomycin 2000 mg IV now, then 1500 mg IV q12h  Height: 6\' 2"  (188 cm) Weight: 104.3 kg (230 lb) IBW/kg (Calculated) : 82.2  Temp (24hrs), Avg:99.6 F (37.6 C), Min:99.1 F (37.3 C), Max:100 F (37.8 C)  Recent Labs  Lab 09/24/19 1912 09/24/19 2233  WBC 13.9*  --   CREATININE 1.04  --   LATICACIDVEN  --  1.8    Estimated Creatinine Clearance: 117.9 mL/min (by C-G formula based on SCr of 1.04 mg/dL).    Allergies  Allergen Reactions  . Penicillins Swelling    Did it involve swelling of the face/tongue/throat, SOB, or low BP? Y Did it involve sudden or severe rash/hives, skin peeling, or any reaction on the inside of your mouth or nose? N Did you need to seek medical attention at a hospital or doctor's office? Y When did it last happen? 10 years If all above answers are "NO", may proceed with cephalosporin use.   2234 09/25/2019 5:45 AM

## 2019-09-25 NOTE — Progress Notes (Addendum)
Cath reviewed with Dr. Eden Emms. Will plan for cardiac MRI tomorrow. Sent message to MRI coordinator to help assist tomorrow. OK to eat post-cath but will hold NPO after midnight pending timing of MRI. Chi Garlow PA-C

## 2019-09-25 NOTE — ED Notes (Signed)
Pt wants something else for pain  Not time yet  He has not slept all night   Frequent calls on his call bell

## 2019-09-25 NOTE — ED Notes (Signed)
Admitting doctor has seen the pt 

## 2019-09-25 NOTE — ED Notes (Signed)
Echo at bedside

## 2019-09-25 NOTE — Consult Note (Addendum)
Cardiology Consultation:   Patient ID: Derrick Hoffman MRN: [id number]; DOB: 1975-11-26  Admit date: 09/24/2019 Date of Consult: 09/25/2019  Primary Care Provider: Patient, No Pcp Per Primary Cardiologist: New to Dr. Johnsie Cancel Primary Electrophysiologist:  None   Patient Profile:   Derrick Hoffman is a 44 y.o. male with a hx of HTN, tobacco abuse, habitual ETOH abuse, abnormal LFTs, diffuse hepatic steatosis by imaging, aortic atherosclerosis by CT 05/2019, hypokalemia who is being seen today for the evaluation of chest pain, abnormal EKG and elevated troponin at the request of Dr. Verlon Au.  History of Present Illness:   Derrick Hoffman denies any prior cardiac history. His mother had an MI at age 61. He was seen in the ED 4/14 for cellulitis following a tick bite. He reports he took about 12 days of doxycycline and felt normal otherwise without any systemic symptoms.   On Saturday 5/22 he noticed he was a little more SOB running around with his kids but did not think much of it until he began having CP on Sunday (yesterday). Yesterday morning, he developed persistent substernal pressure-like chest pain associated with SOB and DOE. He also had chills and feeling hot. He thought he was having a heart attack. He initially thought it may be due to anxiety so laid low but the pain persisted, prompting him to seek care yesterday afternoon. In the ED he was found to be markedly hypertensive at 181/125. He has not taken amlodipine for about a week. He was febrile to 100.1. CXR NAD. CTA showed no evidence of PE, + trace bilateral pleural effusions, hepatic steatosis, visualized aorta was normal. Labs show leukocytosis with WBC 13.9, hypokalemia 3.3->2.9, hsTroponin 23 -> 24 -> 31 -> 26, normal CK, CPR 2.7, lactic acid normal, ESR wnl, UDS + opiates/THC, AST 147/ALT 158, Tbili 2.1, Covid test negative, procalcitonin wnl. He has not had symptoms like this before. The pain is not worse with inspiration, movement, palpation  or recumbency - seems to be present no matter what he does. He has received both ibuprofen and morphine for his chest pain. Ibuprofen did not help. Morphine will bring it down slightly but then it goes back up. He rates it an 8/10 right now, similar to the level he's been feeling all along. No other specific localizing or URI symptoms. He denies n/v, headache or rash. No edema, orthopnea, syncope. He does not plan to get the Covid vaccine. UDS + THC/opiates, negative cocaine. He drinks 1 pint of ETOH about 3-4x week.  2D echo just returned and shows EF 25-30% with severe hypokinesis of the left ventricular, entire anteroseptal wall, severe akinesis of the left ventricular, mid anteroseptal wall, RV function mildly reduced, mild MR, grade 2 DD. He has been resumed on home amlodipine and getting PRN IV hydralazine. He is also on empiric antibiotics, pending Lyme/RMSF titers and blood cultures.  He does not recall any specific major episodes of CP in the preceding weeks. When asked about stress he does indicate there is a lot in his life. He lost 3 family members in April and also reports male problems, declines to go into further details.  Past Medical History:  Diagnosis Date   Abnormal liver function test    Anxiety    Aortic atherosclerosis (HCC)    Childhood asthma    Habitual alcohol use    Hepatic steatosis    History of kidney stones 1990   Hypertension    Hypokalemia    Tobacco abuse  Past Surgical History:  Procedure Laterality Date   ANTERIOR CRUCIATE LIGAMENT REPAIR Right 1996   APPENDECTOMY  01/05/2018   BUNIONECTOMY Left 2013   LAPAROSCOPIC APPENDECTOMY N/A 01/05/2018   Procedure: APPENDECTOMY LAPAROSCOPIC;  Surgeon: Jovita Kussmaul, MD;  Location: [facility];  Service: General;  Laterality: N/A;   ORIF TRIPOD FRACTURE N/A 11/03/2018   Procedure: OPEN REDUCTION INTERNAL FIXATION (ORIF) TRIPOD FRACTURE;  Surgeon: Melissa Montane, MD;  Location: [facility];  Service: ENT;  Laterality: N/A;   ORIF tripod fracture- malar fracture     Home Medications:  Prior to Admission medications   Medication Sig Start Date End Date Taking? Authorizing Provider  amLODipine (NORVASC) 5 MG tablet Take 1 tablet (5 mg total) by mouth daily. 08/16/19  Yes Jaynee Eagles, PA-C  doxycycline (VIBRAMYCIN) 100 MG capsule Take 1 capsule (100 mg total) by mouth 2 (two) times daily. Patient not taking: Reported on 09/25/2019 08/16/19   Jaynee Eagles, PA-C  naproxen (NAPROSYN) 500 MG tablet Take 1 tablet (500 mg total) by mouth 2 (two) times daily. Patient not taking: Reported on 09/25/2019 08/16/19   Jaynee Eagles, PA-C  omeprazole (PRILOSEC) 40 MG capsule Take 1 capsule (40 mg total) by mouth daily for 20 days. Patient not taking: Reported on 05/25/2019 06/25/18 11/03/18  Orson Aloe, MD  ondansetron (ZOFRAN ODT) 4 MG disintegrating tablet Take 1 tablet (4 mg total) by mouth every 8 (eight) hours as needed for nausea or vomiting. Patient not taking: Reported on 09/25/2019 05/25/19   Nuala Alpha A, PA-C  oxyCODONE (OXY IR/ROXICODONE) 5 MG immediate release tablet Take 1-2 tablets (5-10 mg total) by mouth every 6 (six) hours as needed for moderate pain or severe pain (45m for moderate pain, 187mfor severe pain). Patient not taking: Reported on 06/25/2018 01/06/18   JoNorm ParcelPA-C  oxyCODONE-acetaminophen (PERCOCET/ROXICET) 5-325 MG tablet Take 1 tablet by mouth every 6 (six) hours as needed for severe pain. Patient not taking: Reported on 05/25/2019 10/21/18   HaLorin GlassPA-C  potassium chloride (KLOR-CON) 10 MEQ tablet Take 2 tablets (20 mEq total) by mouth daily for 3 days. Patient not taking: Reported on 09/25/2019 05/25/19 05/28/19  MoNuala Alpha, PA-C  sucralfate (CARAFATE) 1 g tablet Take 1 tablet (1 g total) by mouth 4 (four) times daily -  with meals and at bedtime for 10 days. Patient not taking: Reported on 09/18/2018 06/25/18 07/05/18  OcOrson AloeMD    Inpatient Medications: Scheduled  Meds:  amLODipine  5 mg Oral Daily   aspirin EC  81 mg Oral Daily   folic acid  1 mg Oral Daily   ibuprofen  800 mg Oral TID   multivitamin with minerals  1 tablet Oral Daily   potassium chloride  40 mEq Oral Q4H   sodium chloride flush  3 mL Intravenous Once   thiamine  100 mg Oral Daily   Or   thiamine  100 mg Intravenous Daily   Continuous Infusions:  cefTRIAXone (ROCEPHIN)  IV Stopped (09/25/19 0644)   vancomycin     PRN Meds: hydrALAZINE, LORazepam **OR** LORazepam, morphine injection, ondansetron **OR** ondansetron (ZOFRAN) IV  Allergies:    Allergies  Allergen Reactions   Penicillins Swelling    Did it involve swelling of the face/tongue/throat, SOB, or low BP? Y Did it involve sudden or severe rash/hives, skin peeling, or any reaction on the inside of your mouth or nose? N Did you need to seek medical attention at a hospital or doctor's  office? Y When did it last happen? 10 years If all above answers are "NO", may proceed with cephalosporin use.    Social History:   Social History   Socioeconomic History   Marital status: Married    Spouse name: Not on file   Number of children: Not on file   Years of education: Not on file   Highest education level: Not on file  Occupational History   Not on file  Tobacco Use   Smoking status: Current Every Day Smoker    Packs/day: 0.50    Years: 1.00    Pack years: 0.50   Smokeless tobacco: Never Used  Substance and Sexual Activity   Alcohol use: Yes    Alcohol/week: 48.0 standard drinks    Types: 48 Shots of liquor per week    Comment: 01/05/2018 " 5th of liquor/day;  3 days/wk   Drug use: Not Currently   Sexual activity: Not Currently  Other Topics Concern   Not on file  Social History Narrative   Not on file   Social Determinants of Health   Financial Resource Strain:    Difficulty of Paying Living Expenses:   Food Insecurity:    Worried About Charity fundraiser in the Last Year:    Arboriculturist in the  Last Year:   Transportation Needs:    Film/video editor (Medical):    Lack of Transportation (Non-Medical):   Physical Activity:    Days of Exercise per Week:    Minutes of Exercise per Session:   Stress:    Feeling of Stress :   Social Connections:    Frequency of Communication with Friends and Family:    Frequency of Social Gatherings with Friends and Family:    Attends Religious Services:    Active Member of Clubs or Organizations:    Attends Music therapist:    Marital Status:   Intimate Partner Violence:    Fear of Current or Ex-Partner:    Emotionally Abused:    Physically Abused:    Sexually Abused:     Family History:   Family History  Problem Relation Age of Onset   CAD Mother        MI age 76     ROS:  Please see the history of present illness.  All other ROS reviewed and negative.     Physical Exam/Data:   Vitals:   09/25/19 0900 09/25/19 1000 09/25/19 1045 09/25/19 1145  BP: (!) 167/113 (!) 161/102  (!) 164/119  Pulse: (!) 101 97 (!) 101 96  Resp: 19 20  16   Temp:      TempSrc:      SpO2: 97% 99% 97% 98%  Weight:      Height:        Intake/Output Summary (Last 24 hours) at 09/25/2019 1204 Last data filed at 09/25/2019 3419 Gross per 24 hour  Intake 1400 ml  Output 600 ml  Net 800 ml   Last 3 Weights 09/24/2019 11/03/2018 11/01/2018  Weight (lbs) 230 lb 235 lb 7.2 oz 235 lb 8 oz  Weight (kg) 104.327 kg 106.8 kg 106.822 kg     Body mass index is 29.53 kg/m.  General: Well developed, well nourished AAM in no acute distress. Lying flat in bed without SOB. Appears comfortable Head: Normocephalic, atraumatic, sclera non-icteric, no xanthomas, nares are without discharge. Neck: Negative for carotid bruits. JVP not elevated. Lungs: Clear bilaterally to auscultation without wheezes, rales, or rhonchi.  Breathing is unlabored. Heart: RRR S1 S2, +S4, without murmurs, rubs, or gallops.  Abdomen: Soft, non-tender, non-distended with  normoactive bowel sounds. No rebound/guarding. Extremities: No clubbing or cyanosis. No edema. Distal pedal pulses are 2+ and equal bilaterally. Neuro: Alert and oriented X 3. Moves all extremities spontaneously. Psych:  Responds to questions appropriately with a normal affect.  EKG:  The EKG was personally reviewed and demonstrates:    1) 5/23 18:56: sinus tach 112bpm, left axis deivation, LVH with nonspecific TW changes, QTc 586m with QRS 1071m(NSIVCD) 2) 5/24 9:35: sinus tach 105bpm, left axis deviation, LVH with diffuse nonspecific TW changes more prominent from last tracing inferiorly with biphasic TW V3-V4 and flattening V5-V6, subtle ST elevation avL, QTc 56240mTelemetry:  Telemetry was personally reviewed and demonstrates: NSR/ST  Relevant CV Studies: N/A  Laboratory Data:  High Sensitivity Troponin:   Recent Labs  Lab 09/24/19 1912 09/24/19 2040 09/25/19 0715 09/25/19 1042  TROPONINIHS 23* 24* 31* 26*     Chemistry Recent Labs  Lab 09/24/19 1912 09/25/19 0715  NA 142 139  K 3.3* 2.9*  CL 103 100  CO2 22 24  GLUCOSE 101* 107*  BUN 9 7  CREATININE 1.04 1.02  CALCIUM 9.1 8.4*  GFRNONAA >60 >60  GFRAA >60 >60  ANIONGAP 17* 15    Recent Labs  Lab 09/25/19 0715  PROT 7.2  ALBUMIN 3.5  AST 147*  ALT 158*  ALKPHOS 98  BILITOT 2.6*   Hematology Recent Labs  Lab 09/24/19 1912 09/25/19 0715  WBC 13.9* 12.7*  RBC 5.15 4.82  HGB 17.0 15.8  HCT 50.4 47.2  MCV 97.9 97.9  MCH 33.0 32.8  MCHC 33.7 33.5  RDW 14.2 14.2  PLT 291 247   BNPNo results for input(s): BNP, PROBNP in the last 168 hours.  DDimer No results for input(s): DDIMER in the last 168 hours.   Radiology/Studies:  DG Chest 2 View  Result Date: 09/24/2019 CLINICAL DATA:  Chest pain. EXAM: CHEST - 2 VIEW COMPARISON:  October 20, 2018 FINDINGS: The heart, hila, and mediastinum are normal. No pneumothorax. No nodules or masses. No focal infiltrates. IMPRESSION: No active cardiopulmonary  disease. Electronically Signed   By: DavDorise BullionI M.D   On: 09/24/2019 19:28   CT Angio Chest PE W and/or Wo Contrast  Result Date: 09/24/2019 CLINICAL DATA:  ShaHervey Ardntralized chest pain with shortness of breath. EXAM: CT ANGIOGRAPHY CHEST WITH CONTRAST TECHNIQUE: Multidetector CT imaging of the chest was performed using the standard protocol during bolus administration of intravenous contrast. Multiplanar CT image reconstructions and MIPs were obtained to evaluate the vascular anatomy. CONTRAST:  100m47mNIPAQUE IOHEXOL 350 MG/ML SOLN COMPARISON:  None. FINDINGS: Cardiovascular: Contrast injection is sufficient to demonstrate satisfactory opacification of the pulmonary arteries to the segmental level. There is no pulmonary embolus. The main pulmonary artery is within normal limits for size. There is no CT evidence of acute right heart strain. The visualized aorta is normal. Heart size is normal, without pericardial effusion. Mediastinum/Nodes: --No mediastinal or hilar lymphadenopathy. --No axillary lymphadenopathy. --No supraclavicular lymphadenopathy. --Normal thyroid gland. --The esophagus is unremarkable Lungs/Pleura: There are trace bilateral pleural effusions. There is no pneumothorax. No focal infiltrate. The trachea is unremarkable. Upper Abdomen: There appears to be hepatic steatosis. Otherwise, the visualized portions of the upper abdomen are unremarkable. Musculoskeletal: No chest wall abnormality. No acute or significant osseous findings. Review of the MIP images confirms the above findings. IMPRESSION: 1. No evidence of pulmonary  embolus. 2. Trace bilateral pleural effusions. 3. Hepatic steatosis. Electronically Signed   By: Constance Holster M.D.   On: 09/24/2019 23:54   ECHOCARDIOGRAM COMPLETE  Result Date: 09/25/2019    ECHOCARDIOGRAM REPORT   Patient Name:   Derrick Hoffman Date of Exam: 09/25/2019 Medical Rec #:  [id number]     Height:       74.0 in Accession #:    [id number]     Weight:       230.0 lb Date of Birth:  August 27, 1975     BSA:          2.306 m Patient Age:    44 years      BP:           161/102 mmHg Patient Gender: M             HR:           95 bpm. Exam Location:  Inpatient Procedure: 2D Echo, Cardiac Doppler and Color Doppler                     STAT ECHO Reported to: Dr. Acie Fredrickson on 09/25/2019 10:26:00 AM. Indications:    Acute coronary syndrome I24.9  History:        Patient has no prior history of Echocardiogram examinations.                 Risk Factors:Hypertension.  Sonographer:    Jonelle Sidle Dance Referring Phys: [id number]  1. Left ventricular ejection fraction, by estimation, is 25 to 30%. The left ventricle has severely decreased function. The left ventricle demonstrates global hypokinesis. The left ventricular internal cavity size was moderately dilated. Left ventricular diastolic parameters are consistent with Grade II diastolic dysfunction (pseudonormalization). Elevated left ventricular end-diastolic pressure. There is severe hypokinesis of the left ventricular, entire anteroseptal wall. There is severe akinesis of the left ventricular, mid anteroseptal wall.  2. Right ventricular systolic function is mildly reduced. The right ventricular size is normal.  3. The mitral valve is grossly normal. Mild mitral valve regurgitation. No evidence of mitral stenosis.  4. The aortic valve is normal in structure. Aortic valve regurgitation is mild. No aortic stenosis is present. FINDINGS  Left Ventricle: Left ventricular ejection fraction, by estimation, is 25 to 30%. The left ventricle has severely decreased function. The left ventricle demonstrates global hypokinesis. Severe hypokinesis of the left ventricular, entire anteroseptal wall. Severe akinesis of the left ventricular, mid anteroseptal wall. The left ventricular internal cavity size was moderately dilated. There is no left ventricular hypertrophy. Left ventricular diastolic parameters are  consistent with Grade II diastolic  dysfunction (pseudonormalization). Elevated left ventricular end-diastolic pressure. Right Ventricle: The right ventricular size is normal. No increase in right ventricular wall thickness. Right ventricular systolic function is mildly reduced. Left Atrium: Left atrial size was normal in size. Right Atrium: Right atrial size was normal in size. Pericardium: There is no evidence of pericardial effusion. Mitral Valve: The mitral valve is grossly normal. Mild mitral valve regurgitation. No evidence of mitral valve stenosis. Tricuspid Valve: The tricuspid valve is normal in structure. Tricuspid valve regurgitation is mild . No evidence of tricuspid stenosis. Aortic Valve: The aortic valve is normal in structure. Aortic valve regurgitation is mild. Aortic regurgitation PHT measures 358 msec. No aortic stenosis is present. Pulmonic Valve: The pulmonic valve was grossly normal. Pulmonic valve regurgitation is trivial. No evidence of pulmonic stenosis. Aorta: The aortic root and ascending aorta are structurally normal,  with no evidence of dilitation. IAS/Shunts: The atrial septum is grossly normal.  LEFT VENTRICLE PLAX 2D LVIDd:         6.12 cm  Diastology LVIDs:         5.16 cm  LV e' lateral:   3.73 cm/s LV PW:         1.07 cm  LV E/e' lateral: 19.1 LV IVS:        0.95 cm  LV e' medial:    3.49 cm/s LVOT diam:     2.50 cm  LV E/e' medial:  20.4 LV SV:         75 LV SV Index:   33 LVOT Area:     4.91 cm  RIGHT VENTRICLE             IVC RV Basal diam:  2.78 cm     IVC diam: 1.56 cm RV S prime:     11.20 cm/s TAPSE (M-mode): 1.5 cm LEFT ATRIUM             Index       RIGHT ATRIUM           Index LA diam:        4.80 cm 2.08 cm/m  RA Area:     12.80 cm LA Vol (A2C):   91.0 ml 39.45 ml/m RA Volume:   29.60 ml  12.83 ml/m LA Vol (A4C):   58.5 ml 25.36 ml/m LA Biplane Vol: 73.9 ml 32.04 ml/m  AORTIC VALVE LVOT Vmax:   87.20 cm/s LVOT Vmean:  57.000 cm/s LVOT VTI:    0.153 m AI PHT:       358 msec  AORTA Ao Root diam: 3.80 cm Ao Asc diam:  3.60 cm MITRAL VALVE MV Area (PHT): 4.21 cm    SHUNTS MV Decel Time: 180 msec    Systemic VTI:  0.15 m MV E velocity: 71.30 cm/s  Systemic Diam: 2.50 cm MV A velocity: 56.30 cm/s MV E/A ratio:  1.27 Mertie Moores MD Electronically signed by Mertie Moores MD Signature Date/Time: 09/25/2019/11:36:37 AM    Final        HEAR Score (for undifferentiated chest pain):  HEAR Score: 4    Assessment and Plan:   1. Febrile illness of unclear etiology, also here with chest pain/SOB, mildly elevated troponin and acute systolic LV dysfunction - etiology of fever/WBC unclear, tickborne titers pending. The component of LV dysfunction raises question of myocarditis. hsTroponin seems out of proportion to acute LV dysfunction but cannot exclude underlying ACS. CTA showed no PE or acute aortic abnormality in the visualized aorta. However, he continues to have ongoing CP despite analgesics. Per d/w Dr. Johnsie Cancel, plan urgent cardiac catheterization to be done when cath lab next available. Dr. Johnsie Cancel discussed procedure with patient. Will give additional 248m ASA x 1 now to equal 3259m Hold off heparin given that troponins were not significantly abnormal. Add carvedilol. Check lipids in AM - may need to consider PCSK9 in the context of liver disease/abnormal liver function. If cath is unrevealing, anticipate cardiac MRI tomorrow and consider adding ehrlichia titers as well. Does not appear overtly fluid overloaded. Will f/u LVEDP by cath.  2. QT prolongation - in context of hypokalemia and LV dysfunction. Needs cath to exclude ischemia. Replete lytes. F/u EKG in AM. Avoid QT prolonging agents.  3. Hypertensive urgency - received amlodipine this AM. Given LV dysfunction anticipate changing over to GDMT. Dr. NiJohnsie Canceluggests initiating carvedilol 6.254mID and  Entresto 24/53m BID. TSH wnl on 5/24. Check renin/aldo level   4. Hypokalemia - etiology unclear, but has been  present in the past. He received 254m KCl this AM for K 2.9. Will give 2 more doses of 4059mx 1. Check Mg and renin/aldo level. F/u lytes in AM.  5. Polysubstance abuse - THC, ETOH, tobacco - on CIWA protocol. Cessation will be important.  For questions or updates, please contact [facility]lease consult [email or web address] for contact info under     Signed, DayCharlie PitterA-C  09/25/2019 12:04 PM  Patient examined chart reviewed Discussed care with nurse, PA and patient Chest pain is ongoing in ER with labile antero lateral T wave changes and increasing QT interval Troponin only 23-31.  Clinically this should be non ischemic DCM related to ? Tick bite, poorly controlled HTN or substance abuse,  Echo shows global hypokinesis with EF 25-30% but no defined RWMA. However ongoing pain with labile anterolateral  Twaves and family history suggests that diagnostic cath today is indicated given severity of LV dysfunction Exam with black male resting comfortably now. No murmur, clear lungs soft abdomen no edema. Risks of cath including stroke, contrast reaction, MI, bleeding and need for emergency surgery discussed Willing to proceed. If coronary arteries are clean may benefit from cardiac MRI  Start coreg and entresto for GDMT of cardiomyopathy.   PetJenkins Rouge FACJackson County Memorial Hospital

## 2019-09-25 NOTE — H&P (Signed)
History and Physical    Derrick Hoffman OEV:[id number] DOB: Apr 08, 1976 DOA: 09/24/2019  PCP: Patient, No Pcp Per  Patient coming from: Home.  Chief Complaint: Chest pain.  HPI: Derrick Hoffman is a 44 y.o. male with history of hypertension, tobacco abuse and drinks alcohol at least 4 times a week presents to the ER because of chest pain and shortness of breath since yesterday morning.  Pain is persistent retrosternal nonradiating with some shortness of breath and subjective feeling of fever chills.  Denies any nausea vomiting diarrhea productive cough headache or any visual symptoms.  Patient states he was treated for tick bite with 14 days course of doxycycline last month which he completed.  Patient has not developed any joint swelling or skin rash since then.  No further tick bites.  He works as a Games developer.  ED Course: In the ER patient was tachycardic with temperature 100.1 F and WBC count of 13.  EKG was showing sinus tachycardia and troponins were 23 and 24.  Lactic acid 1.8.  CT angiogram of the chest was negative for PE does show mild bilateral pleural effusion.  Blood cultures obtained started on empiric antibiotics and admitted for further observation.  Patient blood pressure was marked elevated with systolic more than 829 and diastolic more than 937.  Patient states he missed his Norvasc for last 1 week.  Covid test was negative.  Review of Systems: As per HPI, rest all negative.   Past Medical History:  Diagnosis Date  . Anxiety   . Childhood asthma   . History of kidney stones 1990  . Hypertension     Past Surgical History:  Procedure Laterality Date  . ANTERIOR CRUCIATE LIGAMENT REPAIR Right 1996  . APPENDECTOMY  01/05/2018  . BUNIONECTOMY Left 2013  . LAPAROSCOPIC APPENDECTOMY N/A 01/05/2018   Procedure: APPENDECTOMY LAPAROSCOPIC;  Surgeon: Jovita Kussmaul, MD;  Location: [facility];  Service: General;  Laterality: N/A;  . ORIF TRIPOD FRACTURE N/A 11/03/2018   Procedure: OPEN  REDUCTION INTERNAL FIXATION (ORIF) TRIPOD FRACTURE;  Surgeon: Melissa Montane, MD;  Location: [facility];  Service: ENT;  Laterality: N/A;  ORIF tripod fracture- malar fracture     reports that he has been smoking. He has a 0.50 pack-year smoking history. He has never used smokeless tobacco. He reports current alcohol use of about 48.0 standard drinks of alcohol per week. He reports previous drug use.  Allergies  Allergen Reactions  . Penicillins Swelling    Did it involve swelling of the face/tongue/throat, SOB, or low BP? Y Did it involve sudden or severe rash/hives, skin peeling, or any reaction on the inside of your mouth or nose? N Did you need to seek medical attention at a hospital or doctor's office? Y When did it last happen? 10 years If all above answers are "NO", may proceed with cephalosporin use.    Family History  Problem Relation Age of Onset  . CAD Mother     Prior to Admission medications   Medication Sig Start Date End Date Taking? Authorizing Provider  amLODipine (NORVASC) 5 MG tablet Take 1 tablet (5 mg total) by mouth daily. 08/16/19  Yes Jaynee Eagles, PA-C  doxycycline (VIBRAMYCIN) 100 MG capsule Take 1 capsule (100 mg total) by mouth 2 (two) times daily. Patient not taking: Reported on 09/25/2019 08/16/19   Jaynee Eagles, PA-C  naproxen (NAPROSYN) 500 MG tablet Take 1 tablet (500 mg total) by mouth 2 (two) times daily. Patient not taking: Reported on  09/25/2019 08/16/19   Wallis Bamberg, PA-C  omeprazole (PRILOSEC) 40 MG capsule Take 1 capsule (40 mg total) by mouth daily for 20 days. Patient not taking: Reported on 05/25/2019 06/25/18 11/03/18  Dahlia Client, MD  ondansetron (ZOFRAN ODT) 4 MG disintegrating tablet Take 1 tablet (4 mg total) by mouth every 8 (eight) hours as needed for nausea or vomiting. Patient not taking: Reported on 09/25/2019 05/25/19   Harlene Salts A, PA-C  oxyCODONE (OXY IR/ROXICODONE) 5 MG immediate release tablet Take 1-2 tablets (5-10 mg total) by mouth  every 6 (six) hours as needed for moderate pain or severe pain (5mg  for moderate pain, 10mg  for severe pain). Patient not taking: Reported on 06/25/2018 01/06/18   06/27/2018, PA-C  oxyCODONE-acetaminophen (PERCOCET/ROXICET) 5-325 MG tablet Take 1 tablet by mouth every 6 (six) hours as needed for severe pain. Patient not taking: Reported on 05/25/2019 10/21/18   05/27/2019, PA-C  potassium chloride (KLOR-CON) 10 MEQ tablet Take 2 tablets (20 mEq total) by mouth daily for 3 days. Patient not taking: Reported on 09/25/2019 05/25/19 05/28/19  05/27/19 A, PA-C  sucralfate (CARAFATE) 1 g tablet Take 1 tablet (1 g total) by mouth 4 (four) times daily -  with meals and at bedtime for 10 days. Patient not taking: Reported on 09/18/2018 06/25/18 07/05/18  06/27/18, MD    Physical Exam: Constitutional: Moderately built and nourished. Vitals:   09/25/19 0130 09/25/19 0200 09/25/19 0215 09/25/19 0300  BP: (!) 157/116 (!) 159/116 (!) 169/124 (!) 171/120  Pulse: 93 90 89 80  Resp: (!) 23 (!) 21 (!) 33 (!) 27  Temp:      TempSrc:      SpO2: 97% 98% 96% 92%  Weight:      Height:       Eyes: Anicteric no pallor. ENMT: No discharge from the ears eyes nose or mouth. Neck: No mass felt.  No neck rigidity. Respiratory: No rhonchi or crepitations. Cardiovascular: S1-S2 heard. Abdomen: Soft nontender bowel sounds present. Musculoskeletal: No edema. Skin: No rash. Neurologic: Alert awake oriented to time place and person.  Moves all extremities. Psychiatric: Appears normal per normal affect.   Labs on Admission: I have personally reviewed following labs and imaging studies  CBC: Recent Labs  Lab 09/24/19 1912  WBC 13.9*  HGB 17.0  HCT 50.4  MCV 97.9  PLT 291   Basic Metabolic Panel: Recent Labs  Lab 09/24/19 1912  NA 142  K 3.3*  CL 103  CO2 22  GLUCOSE 101*  BUN 9  CREATININE 1.04  CALCIUM 9.1   GFR: Estimated Creatinine Clearance: 117.9 mL/min (by C-G  formula based on SCr of 1.04 mg/dL). Liver Function Tests: No results for input(s): AST, ALT, ALKPHOS, BILITOT, PROT, ALBUMIN in the last 168 hours. No results for input(s): LIPASE, AMYLASE in the last 168 hours. No results for input(s): AMMONIA in the last 168 hours. Coagulation Profile: No results for input(s): INR, PROTIME in the last 168 hours. Cardiac Enzymes: No results for input(s): CKTOTAL, CKMB, CKMBINDEX, TROPONINI in the last 168 hours. BNP (last 3 results) No results for input(s): PROBNP in the last 8760 hours. HbA1C: No results for input(s): HGBA1C in the last 72 hours. CBG: No results for input(s): GLUCAP in the last 168 hours. Lipid Profile: No results for input(s): CHOL, HDL, LDLCALC, TRIG, CHOLHDL, LDLDIRECT in the last 72 hours. Thyroid Function Tests: No results for input(s): TSH, T4TOTAL, FREET4, T3FREE, THYROIDAB in the last 72 hours. Anemia  Panel: No results for input(s): VITAMINB12, FOLATE, FERRITIN, TIBC, IRON, RETICCTPCT in the last 72 hours. Urine analysis:    Component Value Date/Time   COLORURINE AMBER (A) 05/25/2019 1402   APPEARANCEUR HAZY (A) 05/25/2019 1402   LABSPEC 1.030 05/25/2019 1402   PHURINE 6.0 05/25/2019 1402   GLUCOSEU NEGATIVE 05/25/2019 1402   HGBUR NEGATIVE 05/25/2019 1402   BILIRUBINUR NEGATIVE 05/25/2019 1402   KETONESUR 5 (A) 05/25/2019 1402   PROTEINUR >=300 (A) 05/25/2019 1402   NITRITE NEGATIVE 05/25/2019 1402   LEUKOCYTESUR NEGATIVE 05/25/2019 1402   Sepsis Labs: @LABRCNTIP (procalcitonin:4,lacticidven:4) ) Recent Results (from the past 240 hour(s))  SARS Coronavirus 2 by RT PCR (hospital order, performed in [facility] hospital lab) Nasopharyngeal Nasopharyngeal Swab     Status: None   Collection Time: 09/24/19  9:30 PM   Specimen: Nasopharyngeal Swab  Result Value Ref Range Status   SARS Coronavirus 2 NEGATIVE NEGATIVE Final    Comment: (NOTE) SARS-CoV-2 target nucleic acids are NOT DETECTED. The SARS-CoV-2 RNA is  generally detectable in upper and lower respiratory specimens during the acute phase of infection. The lowest concentration of SARS-CoV-2 viral copies this assay can detect is 250 copies / mL. A negative result does not preclude SARS-CoV-2 infection and should not be used as the sole basis for treatment or other patient management decisions.  A negative result may occur with improper specimen collection / handling, submission of specimen other than nasopharyngeal swab, presence of viral mutation(s) within the areas targeted by this assay, and inadequate number of viral copies (<250 copies / mL). A negative result must be combined with clinical observations, patient history, and epidemiological information. Fact Sheet for Patients:   09/26/19 Fact Sheet for Healthcare Providers: BoilerBrush.com.cy This test is not yet approved or cleared  by the https://pope.com/ FDA and has been authorized for detection and/or diagnosis of SARS-CoV-2 by FDA under an Emergency Use Authorization (EUA).  This EUA will remain in effect (meaning this test can be used) for the duration of the COVID-19 declaration under Section 564(b)(1) of the Act, 21 U.S.C. section 360bbb-3(b)(1), unless the authorization is terminated or revoked sooner. Performed at [facility], [address], [place], [place]      Radiological Exams on Admission: DG Chest 2 View  Result Date: 09/24/2019 CLINICAL DATA:  Chest pain. EXAM: CHEST - 2 VIEW COMPARISON:  October 20, 2018 FINDINGS: The heart, hila, and mediastinum are normal. No pneumothorax. No nodules or masses. No focal infiltrates. IMPRESSION: No active cardiopulmonary disease. Electronically Signed   By: October 22, 2018 III M.D   On: 09/24/2019 19:28   CT Angio Chest PE W and/or Wo Contrast  Result Date: 09/24/2019 CLINICAL DATA:  09/26/2019 centralized chest pain with shortness of breath. EXAM: CT ANGIOGRAPHY  CHEST WITH CONTRAST TECHNIQUE: Multidetector CT imaging of the chest was performed using the standard protocol during bolus administration of intravenous contrast. Multiplanar CT image reconstructions and MIPs were obtained to evaluate the vascular anatomy. CONTRAST:  [name] OMNIPAQUE IOHEXOL 350 MG/ML SOLN COMPARISON:  None. FINDINGS: Cardiovascular: Contrast injection is sufficient to demonstrate satisfactory opacification of the pulmonary arteries to the segmental level. There is no pulmonary embolus. The main pulmonary artery is within normal limits for size. There is no CT evidence of acute right heart strain. The visualized aorta is normal. Heart size is normal, without pericardial effusion. Mediastinum/Nodes: --No mediastinal or hilar lymphadenopathy. --No axillary lymphadenopathy. --No supraclavicular lymphadenopathy. --Normal thyroid gland. --The esophagus is unremarkable Lungs/Pleura: There are  trace bilateral pleural effusions. There is no pneumothorax. No focal infiltrate. The trachea is unremarkable. Upper Abdomen: There appears to be hepatic steatosis. Otherwise, the visualized portions of the upper abdomen are unremarkable. Musculoskeletal: No chest wall abnormality. No acute or significant osseous findings. Review of the MIP images confirms the above findings. IMPRESSION: 1. No evidence of pulmonary embolus. 2. Trace bilateral pleural effusions. 3. Hepatic steatosis. Electronically Signed   By: Katherine Mantle M.D.   On: 09/24/2019 23:54    EKG: Independently reviewed.  Sinus tachycardia.  No definite signs of any pericarditis.  Assessment/Plan Principal Problem:   SIRS (systemic inflammatory response syndrome) (HCC) Active Problems:   Hypertensive urgency   Tick bite   Chest pain    1. SIRS with chest pain cause not clear.  Blood cultures have been obtained will check 2D echo check CRP sed rate other differentials myocarditis and no definite signs of any pericarditis..  Since patient  has had tick bite will get Lyme and RMSF titer.  Patient empirically placed on Vanco and ceftriaxone for now.  LFTs and lipase are pending. 2. Hypertensive urgency likely from missing his medications.  As needed IV hydralazine along with his home medicine of amlodipine. 3. Tobacco and alcohol abuse advised about quitting.  On CIWA. 4. Recent tick bite.  Since patient has SIRS symptoms with hypertensive urgency will admit for inpatient and closely monitor.   DVT prophylaxis: SCDs for now since check 2D echo to see there is no pericardial effusion. Code Status: Full code. Family Communication: Discussed with patient. Disposition Plan: Home. Consults called: Cardiology. Admission status: Inpatient   Eduard Clos MD [facility] Pager [phone].  If 7PM-7AM, please contact night-coverage [email or web address] Password [facility]  09/25/2019, 4:27 AM

## 2019-09-25 NOTE — Progress Notes (Signed)
  Echocardiogram 2D Echocardiogram has been performed.  Derrick Hoffman 09/25/2019, 10:27 AM

## 2019-09-25 NOTE — ED Notes (Signed)
Pt requesting water given   He reports that his pain was down briefly but now is back where it began  8/10

## 2019-09-25 NOTE — ED Notes (Signed)
Admission orders just now populated

## 2019-09-25 NOTE — Interval H&P Note (Signed)
Cath Lab Visit (complete for each Cath Lab visit)  Clinical Evaluation Leading to the Procedure:   ACS: Yes.    Non-ACS:    Anginal Classification: CCS III  Anti-ischemic medical therapy: Minimal Therapy (1 class of medications)  Non-Invasive Test Results: No non-invasive testing performed  Prior CABG: No previous CABG      History and Physical Interval Note:  09/25/2019 2:31 PM  Derrick Hoffman  has presented today for surgery, with the diagnosis of chest pain.  The various methods of treatment have been discussed with the patient and family. After consideration of risks, benefits and other options for treatment, the patient has consented to  Procedure(s): LEFT HEART CATH AND CORONARY ANGIOGRAPHY (N/A) as a surgical intervention.  The patient's history has been reviewed, patient examined, no change in status, stable for surgery.  I have reviewed the patient's chart and labs.  Questions were answered to the patient's satisfaction.     Nanetta Batty

## 2019-09-26 ENCOUNTER — Inpatient Hospital Stay (HOSPITAL_COMMUNITY): Payer: Self-pay

## 2019-09-26 DIAGNOSIS — I428 Other cardiomyopathies: Secondary | ICD-10-CM

## 2019-09-26 LAB — BLOOD CULTURE ID PANEL (REFLEXED)

## 2019-09-26 LAB — CBC
HCT: 49.3 % (ref 39.0–52.0)
Hemoglobin: 16.7 g/dL (ref 13.0–17.0)
MCH: 32.9 pg (ref 26.0–34.0)
MCHC: 33.9 g/dL (ref 30.0–36.0)
MCV: 97.2 fL (ref 80.0–100.0)
Platelets: 248 10*3/uL (ref 150–400)
RBC: 5.07 MIL/uL (ref 4.22–5.81)
RDW: 14 % (ref 11.5–15.5)
WBC: 13.9 10*3/uL — ABNORMAL HIGH (ref 4.0–10.5)
nRBC: 0 % (ref 0.0–0.2)

## 2019-09-26 LAB — BASIC METABOLIC PANEL
Anion gap: 13 (ref 5–15)
BUN: 7 mg/dL (ref 6–20)
CO2: 21 mmol/L — ABNORMAL LOW (ref 22–32)
Calcium: 8.6 mg/dL — ABNORMAL LOW (ref 8.9–10.3)
Chloride: 102 mmol/L (ref 98–111)
Creatinine, Ser: 0.89 mg/dL (ref 0.61–1.24)
GFR calc Af Amer: >60 mL/min (ref >60)
GFR calc non Af Amer: >60 mL/min (ref >60)
Glucose, Bld: 99 mg/dL (ref 70–99)
Potassium: 3.6 mmol/L (ref 3.5–5.1)
Sodium: 136 mmol/L (ref 135–145)

## 2019-09-26 LAB — LIPID PANEL
Cholesterol: 287 mg/dL — ABNORMAL HIGH (ref 0–200)
HDL: 33 mg/dL — ABNORMAL LOW (ref >40)
LDL Cholesterol: 222 mg/dL — ABNORMAL HIGH (ref 0–99)
Total CHOL/HDL Ratio: 8.7 RATIO
Triglycerides: 158 mg/dL — ABNORMAL HIGH (ref <150)
VLDL: 32 mg/dL (ref 0–40)

## 2019-09-26 LAB — MAGNESIUM: Magnesium: 2 mg/dL (ref 1.7–2.4)

## 2019-09-26 MED ORDER — DOXYCYCLINE HYCLATE 100 MG PO TABS
100.0000 mg | ORAL_TABLET | Freq: Two times a day (BID) | ORAL | Status: DC
  Administered 2019-09-26 – 2019-09-28 (×4): 100 mg via ORAL
  Filled 2019-09-26 (×4): qty 1

## 2019-09-26 MED ORDER — DIAZEPAM 5 MG PO TABS
5.0000 mg | ORAL_TABLET | Freq: Once | ORAL | Status: AC
  Administered 2019-09-26: 5 mg via ORAL
  Filled 2019-09-26: qty 1

## 2019-09-26 MED ORDER — GADOBUTROL 1 MMOL/ML IV SOLN
10.0000 mL | Freq: Once | INTRAVENOUS | Status: AC | PRN
  Administered 2019-09-26: 10 mL via INTRAVENOUS

## 2019-09-26 MED ORDER — CARVEDILOL 12.5 MG PO TABS
12.5000 mg | ORAL_TABLET | Freq: Two times a day (BID) | ORAL | Status: DC
  Administered 2019-09-26 – 2019-09-28 (×5): 12.5 mg via ORAL
  Filled 2019-09-26 (×5): qty 1

## 2019-09-26 MED ORDER — SACUBITRIL-VALSARTAN 49-51 MG PO TABS
1.0000 | ORAL_TABLET | Freq: Two times a day (BID) | ORAL | Status: DC
  Administered 2019-09-26 – 2019-09-28 (×5): 1 via ORAL
  Filled 2019-09-26 (×6): qty 1

## 2019-09-26 MED ORDER — PNEUMOCOCCAL VAC POLYVALENT 25 MCG/0.5ML IJ INJ
0.5000 mL | INJECTION | INTRAMUSCULAR | Status: AC
  Administered 2019-09-28: 0.5 mL via INTRAMUSCULAR
  Filled 2019-09-26: qty 0.5

## 2019-09-26 NOTE — Progress Notes (Signed)
PROGRESS NOTE    Derrick Hoffman  PZW:[id number] DOB: Aug 31, 1975 DOA: 09/24/2019 PCP: Patient, No Pcp Per    No chief complaint on file.   Brief Narrative:  44 year old black male prior appendectomy 01/05/2018 Chronic EtOH, tobacco, cannabis use Recent left tripod facial fracture Known hiatal hernia with multiple emergency room visits 08/2023/2020 nausea vomiting abdominal pain Seen in the emergency room most recently 08/16/2019 cellulitis right lower extremity?  Tick bite-Rx doxycycline counseled to follow-up with PCP  Returns 09/25/2019-SIRS criteria met WBC 13 lactic acid 1.8 + chest pain  Cardiology consulted 2/2 worsening T wave inversions that occurred dynamically across precordial leads later in the day and he went for semiemergent cath which showed no acute blockages and was consistent with NICM  He was taken over by the cardiology service however because of persisting concerns with regards to possibility of infection and knee pain we are seeing this afternoon   Assessment & Plan:   Principal Problem:   SIRS (systemic inflammatory response syndrome) ([place]) Active Problems:   Hypertensive urgency   Tick bite   Chest pain   NICM (nonischemic cardiomyopathy) ([place])   Elevated troponin  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 EF 25-30% = hypokinesis LV/anteroseptal akinesis cardiac MRI = confirmatory for  NICM 2. Greatly appreciate cardiology expertise management and medication adjustments-currently Coreg 12.5 twice daily Entresto 1 twice daily 3. Blood pressures are still slightly elevated with some room for control so may be able to increase beta-blocker and/or add ACE inhibitor given prior microproteinuria on UA in the past Viral component of cardiomyopathy?? 1. Patient tells me he had treatment for viral exanthem in April but only finished half the dosing 2. Empirically use doxycycline 100 twice daily (would use at least 10 days to 14 days)  as this covers most viral illnesses-we are still waiting on titers of [facility] etc.  3. The rash on posterior aspect of his right knee is well scabbed over and looks like it is a hypertrophic scar-it does not appear to be infected there is no purulence so I would argue that this is not a concern at this time Chronic ethanolism polysubstance 1. Last drink was Saturday we will discontinue CIWA 2. He has been imprisoned in the past I will get based on high risk profile hepatitis panels Chronic leukocytosis dating back to January 2021 1. Etiology unclear-starting back doxycycline-if white count does not drop may need platelet smear and or consideration for further work-up he does not have a fever so I do not know if this is representative of infectious component 2. Could be from Hepatitis and HIV [has been in prison before ]--will obtain labs for these   DVT prophylaxis: Per cardiology Code Status: Full Family Communication: Discussed with patient's wife at the bedside Disposition:   Status is: Inpatient  Remains inpatient appropriate because:Persistent severe electrolyte disturbances and Ongoing diagnostic testing needed not appropriate for outpatient work up   Dispo: The patient is from: Home              Anticipated d/c is to: Home              Anticipated d/c date is: 2 days              Patient currently is not medically stable to d/c.  Consultants:   Cardiology  Procedures: Cardiac cath as above  Antimicrobials: Resume doxycycline 5/25   Subjective: Doing fair Less short of breath Worried about scab behind right  knee that has been there since April-reassured that this is hypertrophied scar No chest pain No fever tolerating diet  Objective: Vitals:   09/26/19 0053 09/26/19 0339 09/26/19 0543 09/26/19 0817  BP: (!) 150/112  (!) 158/111 (!) 160/118  Pulse: (!) 108  (!) 109 (!) 103  Resp: 17  16 18   Temp: 99.1 F (37.3 C)  98.2 F (36.8 C) 98.6 F (37 C)    TempSrc: Oral  Oral Oral  SpO2: 97%  98%   Weight:  102.5 kg    Height:        Intake/Output Summary (Last 24 hours) at 09/26/2019 1530 Last data filed at 09/26/2019 0700 Gross per 24 hour  Intake 1120 ml  Output 275 ml  Net 845 ml   Filed Weights   09/24/19 1857 09/25/19 1304 09/26/19 0339  Weight: 104.3 kg 104.3 kg 102.5 kg    Examination:  General exam: EOMI NCAT looks about stated age no lymphadenopathy no thyromegaly Respiratory system: CTA B no added sound rales rhonchi Cardiovascular system: S1-S2 cannot appreciate murmur Gastrointestinal system: Soft nontender no organomegaly. Central nervous system: Intact Extremities: Soft supple Skin: Multiple tattoos all over upper back and chest Psychiatry: euthymic congruent    Data Reviewed: I have personally reviewed following labs and imaging studies White count 13 and looking back at all of his labs up until 09/17/2018 he is always had a moderate leukocytosis His platelet count is normal He always seems to have a predominant mild neutrophilia  BUNs/creatinine 7/0.8 LDL 222 triglycerides 158 HDL 33   Radiology Studies: DG Chest 2 View  Result Date: 09/24/2019 CLINICAL DATA:  Chest pain. EXAM: CHEST - 2 VIEW COMPARISON:  October 20, 2018 FINDINGS: The heart, hila, and mediastinum are normal. No pneumothorax. No nodules or masses. No focal infiltrates. IMPRESSION: No active cardiopulmonary disease. Electronically Signed   By: Dorise Bullion III M.D   On: 09/24/2019 19:28   CT Angio Chest PE W and/or Wo Contrast  Result Date: 09/24/2019 CLINICAL DATA:  Hervey Ard centralized chest pain with shortness of breath. EXAM: CT ANGIOGRAPHY CHEST WITH CONTRAST TECHNIQUE: Multidetector CT imaging of the chest was performed using the standard protocol during bolus administration of intravenous contrast. Multiplanar CT image reconstructions and MIPs were obtained to evaluate the vascular anatomy. CONTRAST:  1110m OMNIPAQUE IOHEXOL 350 MG/ML  SOLN COMPARISON:  None. FINDINGS: Cardiovascular: Contrast injection is sufficient to demonstrate satisfactory opacification of the pulmonary arteries to the segmental level. There is no pulmonary embolus. The main pulmonary artery is within normal limits for size. There is no CT evidence of acute right heart strain. The visualized aorta is normal. Heart size is normal, without pericardial effusion. Mediastinum/Nodes: --No mediastinal or hilar lymphadenopathy. --No axillary lymphadenopathy. --No supraclavicular lymphadenopathy. --Normal thyroid gland. --The esophagus is unremarkable Lungs/Pleura: There are trace bilateral pleural effusions. There is no pneumothorax. No focal infiltrate. The trachea is unremarkable. Upper Abdomen: There appears to be hepatic steatosis. Otherwise, the visualized portions of the upper abdomen are unremarkable. Musculoskeletal: No chest wall abnormality. No acute or significant osseous findings. Review of the MIP images confirms the above findings. IMPRESSION: 1. No evidence of pulmonary embolus. 2. Trace bilateral pleural effusions. 3. Hepatic steatosis. Electronically Signed   By: CConstance HolsterM.D.   On: 09/24/2019 23:54   CARDIAC CATHETERIZATION  Result Date: 09/25/2019 Derrick PASKETTis a 44y.o. male  [id number]:  [facility]: JQuay Burow M.D. [id number] OF PROCEDURE:  09/25/2019 DATE OF DISCHARGE: CARDIAC  CATHETERIZATION History obtained from chart review.Derrick Hoffman is a 45 y.o. male with a hx of HTN, tobacco abuse, habitual ETOH abuse, abnormal LFTs, diffuse hepatic steatosis by imaging, aortic atherosclerosis by CT 05/2019, hypokalemia who is being seen today for the evaluation of chest pain, abnormal EKG and elevated troponin  his EKG showed inferior and anterolateral T wave inversion.  His 2D echo revealed severe LV dysfunction with an EF in the 25% range.   Mr. Goza has clean coronary arteries, moderate elevation of LVEDP and severe LV  dysfunction consistent with a nonischemic cardiomyopathy potentially related to hypertensive heart disease and/or excessive alcohol intake.  Guideline directed optimal medical therapy will be recommended including carvedilol, Entresto, spironolactone as he has already been recommended in the chart.  Cardiac risk factor modification including smoking cessation and avoidance of illicit drugs including alcohol.  The sheath was removed and a TR band was placed on the right wrist to achieve patent hemostasis.  The patient left lab in stable condition.  I am concerned that medical compliance will be an issue. Quay Burow. MD, [facility] 09/25/2019 3:13 PM   MR CARDIAC MORPHOLOGY W WO CONTRAST  Result Date: 09/26/2019 CLINICAL DATA:  Cardiomyopathy EXAM: CARDIAC MRI TECHNIQUE: The patient was scanned on a 1.5 Tesla Siemens magnet. A dedicated cardiac coil was used. Functional imaging was done using Fiesta sequences. 2,3, and 4 chamber views were done to assess for RWMA's. Modified Simpson's rule using a short axis stack was used to calculate an ejection fraction on a dedicated work Conservation officer, nature. The patient received 10 cc of Gadavist. After 10 minutes inversion recovery sequences were used to assess for infiltration and scar tissue. CONTRAST:  Gadavist FINDINGS: Mild LAE Normal RA/RV. No pericardial effusion. No ASD/PFO. Normal MV, AV and TV. Severe LVE with diffuse hypokinesis No defined RWMA;s. Quantitative EF 20% (EDV 221 cc ESV 176 cc SV 45 cc) No LAA thrombus. Delayed gadolinium images normal with no evidence of infarct, infiltration or myocarditis IMPRESSION: 1. Severe LVE with diffuse hypokinesis EF 20% 2. No delayed gadolinium uptake on inversion recovery sequences. Findings consistent with non ischemic cardiomyopathy 3.  Mild LAE 4.  Normla RV size and function Jenkins Rouge Electronically Signed   By: Jenkins Rouge M.D.   On: 09/26/2019 12:57   ECHOCARDIOGRAM COMPLETE  Result Date: 09/25/2019     ECHOCARDIOGRAM REPORT   Patient Name:   Derrick Hoffman Frater Date of Exam: 09/25/2019 Medical Rec #:  [id number]     Height:       74.0 in Accession #:    [id number]    Weight:       230.0 lb Date of Birth:  [birth year]     BSA:          2.306 m Patient Age:    16 years      BP:           161/102 mmHg Patient Gender: M             HR:           95 bpm. Exam Location:  Inpatient Procedure: 2D Echo, Cardiac Doppler and Color Doppler                     STAT ECHO Reported to: Dr. Acie Fredrickson on 09/25/2019 10:26:00 AM. Indications:    Acute coronary syndrome I24.9  History:        Patient has no prior history of Echocardiogram examinations.  Risk Factors:Hypertension.  Sonographer:    Jonelle Sidle Dance Referring Phys: [id number] Cale  1. Left ventricular ejection fraction, by estimation, is 25 to 30%. The left ventricle has severely decreased function. The left ventricle demonstrates global hypokinesis. The left ventricular internal cavity size was moderately dilated. Left ventricular diastolic parameters are consistent with Grade II diastolic dysfunction (pseudonormalization). Elevated left ventricular end-diastolic pressure. There is severe hypokinesis of the left ventricular, entire anteroseptal wall. There is severe akinesis of the left ventricular, mid anteroseptal wall.  2. Right ventricular systolic function is mildly reduced. The right ventricular size is normal.  3. The mitral valve is grossly normal. Mild mitral valve regurgitation. No evidence of mitral stenosis.  4. The aortic valve is normal in structure. Aortic valve regurgitation is mild. No aortic stenosis is present. FINDINGS  Left Ventricle: Left ventricular ejection fraction, by estimation, is 25 to 30%. The left ventricle has severely decreased function. The left ventricle demonstrates global hypokinesis. Severe hypokinesis of the left ventricular, entire anteroseptal wall. Severe akinesis of the left ventricular, mid anteroseptal  wall. The left ventricular internal cavity size was moderately dilated. There is no left ventricular hypertrophy. Left ventricular diastolic parameters are consistent with Grade II diastolic  dysfunction (pseudonormalization). Elevated left ventricular end-diastolic pressure. Right Ventricle: The right ventricular size is normal. No increase in right ventricular wall thickness. Right ventricular systolic function is mildly reduced. Left Atrium: Left atrial size was normal in size. Right Atrium: Right atrial size was normal in size. Pericardium: There is no evidence of pericardial effusion. Mitral Valve: The mitral valve is grossly normal. Mild mitral valve regurgitation. No evidence of mitral valve stenosis. Tricuspid Valve: The tricuspid valve is normal in structure. Tricuspid valve regurgitation is mild . No evidence of tricuspid stenosis. Aortic Valve: The aortic valve is normal in structure. Aortic valve regurgitation is mild. Aortic regurgitation PHT measures 358 msec. No aortic stenosis is present. Pulmonic Valve: The pulmonic valve was grossly normal. Pulmonic valve regurgitation is trivial. No evidence of pulmonic stenosis. Aorta: The aortic root and ascending aorta are structurally normal, with no evidence of dilitation. IAS/Shunts: The atrial septum is grossly normal.  LEFT VENTRICLE PLAX 2D LVIDd:         6.12 cm  Diastology LVIDs:         5.16 cm  LV e' lateral:   3.73 cm/s LV PW:         1.07 cm  LV E/e' lateral: 19.1 LV IVS:        0.95 cm  LV e' medial:    3.49 cm/s LVOT diam:     2.50 cm  LV E/e' medial:  20.4 LV SV:         75 LV SV Index:   33 LVOT Area:     4.91 cm  RIGHT VENTRICLE             IVC RV Basal diam:  2.78 cm     IVC diam: 1.56 cm RV S prime:     11.20 cm/s TAPSE (M-mode): 1.5 cm LEFT ATRIUM             Index       RIGHT ATRIUM           Index LA diam:        4.80 cm 2.08 cm/m  RA Area:     12.80 cm LA Vol (A2C):   91.0 ml 39.45 ml/m RA Volume:   29.60 ml  12.83 ml/m LA  Vol (A4C):    58.5 ml 25.36 ml/m LA Biplane Vol: 73.9 ml 32.04 ml/m  AORTIC VALVE LVOT Vmax:   87.20 cm/s LVOT Vmean:  57.000 cm/s LVOT VTI:    0.153 m AI PHT:      358 msec  AORTA Ao Root diam: 3.80 cm Ao Asc diam:  3.60 cm MITRAL VALVE MV Area (PHT): 4.21 cm    SHUNTS MV Decel Time: 180 msec    Systemic VTI:  0.15 m MV E velocity: 71.30 cm/s  Systemic Diam: 2.50 cm MV A velocity: 56.30 cm/s MV E/A ratio:  1.27 Mertie Moores MD Electronically signed by Mertie Moores MD Signature Date/Time: 09/25/2019/11:36:37 AM    Final       Scheduled Meds: . aspirin EC  81 mg Oral Daily  . carvedilol  12.5 mg Oral BID WC  . folic acid  1 mg Oral Daily  . multivitamin with minerals  1 tablet Oral Daily  . [START ON 09/27/2019] pneumococcal 23 valent vaccine  0.5 mL Intramuscular Tomorrow-1000  . sacubitril-valsartan  1 tablet Oral BID  . sodium chloride flush  3 mL Intravenous Q12H  . sodium chloride flush  3 mL Intravenous Q12H  . thiamine  100 mg Oral Daily   Or  . thiamine  100 mg Intravenous Daily   Continuous Infusions: . sodium chloride       LOS: 1 day    Time spent: [place], MD [facility]   To contact the attending provider between 7A-7P or the covering provider during after hours 7P-7A, please log into the web site [email or web address] and access using universal [place] password for that web site. If you do not have the password, please call the hospital operator.  09/26/2019, 3:30 PM

## 2019-09-26 NOTE — Progress Notes (Signed)
PHARMACY - PHYSICIAN COMMUNICATION CRITICAL VALUE ALERT - BLOOD CULTURE IDENTIFICATION (BCID)  Results for orders placed or performed during the hospital encounter of 09/24/19  Blood Culture ID Panel (Reflexed) (Collected: 09/24/2019 10:34 PM)  Result Value Ref Range   Enterococcus species NOT DETECTED NOT DETECTED   Listeria monocytogenes NOT DETECTED NOT DETECTED   Staphylococcus species DETECTED (A) NOT DETECTED   Staphylococcus aureus (BCID) NOT DETECTED NOT DETECTED   Methicillin resistance NOT DETECTED NOT DETECTED   Streptococcus species NOT DETECTED NOT DETECTED   Streptococcus agalactiae NOT DETECTED NOT DETECTED   Streptococcus pneumoniae NOT DETECTED NOT DETECTED   Streptococcus pyogenes NOT DETECTED NOT DETECTED   Acinetobacter baumannii NOT DETECTED NOT DETECTED   Enterobacteriaceae species NOT DETECTED NOT DETECTED   Enterobacter cloacae complex NOT DETECTED NOT DETECTED   Escherichia coli NOT DETECTED NOT DETECTED   Klebsiella oxytoca NOT DETECTED NOT DETECTED   Klebsiella pneumoniae NOT DETECTED NOT DETECTED   Proteus species NOT DETECTED NOT DETECTED   Serratia marcescens NOT DETECTED NOT DETECTED   Haemophilus influenzae NOT DETECTED NOT DETECTED   Neisseria meningitidis NOT DETECTED NOT DETECTED   Pseudomonas aeruginosa NOT DETECTED NOT DETECTED   Candida albicans NOT DETECTED NOT DETECTED   Candida glabrata NOT DETECTED NOT DETECTED   Candida krusei NOT DETECTED NOT DETECTED   Candida parapsilosis NOT DETECTED NOT DETECTED   Candida tropicalis NOT DETECTED NOT DETECTED   Patient with 1/4 bottles of methicillin sensitive coagulase negative staphylococcus species noted in blood cultures. High likelihood of contamination.     Name of physician (or Provider) ContactedMahala Menghini MD  Changes to prescribed antibiotics required: Antibiotics recently de-escalated to doxycyline. Still with leukocytosis of 13.9. Concern for tick bite last month. Do not recommend changes at  this time given high possibility of contamination.   Sheppard Coil PharmD., BCPS Clinical Pharmacist 09/26/2019 4:20 PM

## 2019-09-26 NOTE — Progress Notes (Signed)
Subjective:  Still with dyspnea when laying down   Objective:  Vitals:   09/26/19 0053 09/26/19 0339 09/26/19 0543 09/26/19 0817  BP: (!) 150/112  (!) 158/111 (!) 160/118  Pulse: (!) 108  (!) 109 (!) 103  Resp: 17  16   Temp: 99.1 F (37.3 C)  98.2 F (36.8 C)   TempSrc: Oral  Oral   SpO2: 97%  98%   Weight:  102.5 kg    Height:        Intake/Output from previous day:  Intake/Output Summary (Last 24 hours) at 09/26/2019 0820 Last data filed at 09/26/2019 0700 Gross per 24 hour  Intake 1320 ml  Output 275 ml  Net 1045 ml    Physical Exam: Affect appropriate Healthy:  appears stated age HEENT: normal Neck supple with no adenopathy JVP normal no bruits no thyromegaly Lungs clear with no wheezing and good diaphragmatic motion Heart:  S1/S2 no murmur, no rub, gallop or click PMI  Enlarged  Abdomen: benighn, BS positve, no tenderness, no AAA no bruit.  No HSM or HJR Right radial cath site A  No edema Neuro non-focal Skin warm and dry No muscular weakness   Lab Results: Basic Metabolic Panel: Recent Labs    09/25/19 1303 09/26/19 0416  NA 137 136  K 3.1* 3.6  CL 100 102  CO2 25 21*  GLUCOSE 112* 99  BUN 6 7  CREATININE 0.91 0.89  CALCIUM 8.6* 8.6*  MG 1.4* 2.0   Liver Function Tests: Recent Labs    09/25/19 0715  AST 147*  ALT 158*  ALKPHOS 98  BILITOT 2.6*  PROT 7.2  ALBUMIN 3.5   Recent Labs    09/25/19 0715  LIPASE 37   CBC: Recent Labs    09/25/19 0715 09/26/19 0416  WBC 12.7* 13.9*  NEUTROABS 9.0*  --   HGB 15.8 16.7  HCT 47.2 49.3  MCV 97.9 97.2  PLT 247 248   Cardiac Enzymes: Recent Labs    09/25/19 0715  CKTOTAL 267   Fasting Lipid Panel: Recent Labs    09/26/19 0416  CHOL 287*  HDL 33*  LDLCALC 222*  TRIG 158*  CHOLHDL 8.7   Thyroid Function Tests: Recent Labs    09/25/19 0715  TSH 2.911    Imaging: DG Chest 2 View  Result Date: 09/24/2019 CLINICAL DATA:  Chest pain. EXAM: CHEST - 2 VIEW  COMPARISON:  October 20, 2018 FINDINGS: The heart, hila, and mediastinum are normal. No pneumothorax. No nodules or masses. No focal infiltrates. IMPRESSION: No active cardiopulmonary disease. Electronically Signed   By: Gerome Sam III M.D   On: 09/24/2019 19:28   CT Angio Chest PE W and/or Wo Contrast  Result Date: 09/24/2019 CLINICAL DATA:  Derrick Hoffman centralized chest pain with shortness of breath. EXAM: CT ANGIOGRAPHY CHEST WITH CONTRAST TECHNIQUE: Multidetector CT imaging of the chest was performed using the standard protocol during bolus administration of intravenous contrast. Multiplanar CT image reconstructions and MIPs were obtained to evaluate the vascular anatomy. CONTRAST:  OMNIPAQUE IOHEXOL 350 MG/ML SOLN COMPARISON:  None. FINDINGS: Cardiovascular: Contrast injection is sufficient to demonstrate satisfactory opacification of the pulmonary arteries to the segmental level. There is no pulmonary embolus. The main pulmonary artery is within normal limits for size. There is no CT evidence of acute right heart strain. The visualized aorta is normal. Heart size is normal, without pericardial effusion. Mediastinum/Nodes: --No mediastinal or hilar lymphadenopathy. --No axillary lymphadenopathy. --No supraclavicular lymphadenopathy. --Normal thyroid  gland. --The esophagus is unremarkable Lungs/Pleura: There are trace bilateral pleural effusions. There is no pneumothorax. No focal infiltrate. The trachea is unremarkable. Upper Abdomen: There appears to be hepatic steatosis. Otherwise, the visualized portions of the upper abdomen are unremarkable. Musculoskeletal: No chest wall abnormality. No acute or significant osseous findings. Review of the MIP images confirms the above findings. IMPRESSION: 1. No evidence of pulmonary embolus. 2. Trace bilateral pleural effusions. 3. Hepatic steatosis. Electronically Signed   By: Katherine Mantle M.D.   On: 09/24/2019 23:54   CARDIAC CATHETERIZATION  Result  Date: 09/25/2019 Derrick Hoffman is a 44 y.o. male  [id number] LOCATION:  FACILITY: [facility] PHYSICIAN: Nanetta Batty, M.D. 03/31/1976 DATE OF PROCEDURE:  09/25/2019 DATE OF DISCHARGE: CARDIAC CATHETERIZATION History obtained from chart review.Derrick Hoffman is a 43 y.o. male with a hx of HTN, tobacco abuse, habitual ETOH abuse, abnormal LFTs, diffuse hepatic steatosis by imaging, aortic atherosclerosis by CT 05/2019, hypokalemia who is being seen today for the evaluation of chest pain, abnormal EKG and elevated troponin  his EKG showed inferior and anterolateral T wave inversion.  His 2D echo revealed severe LV dysfunction with an EF in the 25% range.   Derrick Hoffman has clean coronary arteries, moderate elevation of LVEDP and severe LV dysfunction consistent with a nonischemic cardiomyopathy potentially related to hypertensive heart disease and/or excessive alcohol intake.  Guideline directed optimal medical therapy will be recommended including carvedilol, Entresto, spironolactone as he has already been recommended in the chart.  Cardiac risk factor modification including smoking cessation and avoidance of illicit drugs including alcohol.  The sheath was removed and a TR band was placed on the right wrist to achieve patent hemostasis.  The patient left lab in stable condition.  I am concerned that medical compliance will be an issue. Nanetta Batty. MD, [facility] 09/25/2019 3:13 PM   ECHOCARDIOGRAM COMPLETE  Result Date: 09/25/2019    ECHOCARDIOGRAM REPORT   Patient Name:   Derrick Hoffman [place] Date of Exam: 09/25/2019 Medical Rec #:  [id number]     Height:       74.0 in Accession #:    [id number]    Weight:       230.0 lb Date of Birth:  [birth year]     BSA:          2.306 m Patient Age:    44 years      BP:           161/102 mmHg Patient Gender: M             HR:           95 bpm. Exam Location:  Inpatient Procedure: 2D Echo, Cardiac Doppler and Color Doppler                     STAT ECHO Reported to: Dr. Elease Hashimoto on 09/25/2019 10:26:00  AM. Indications:    Acute coronary syndrome I24.9  History:        Patient has no prior history of Echocardiogram examinations.                 Risk Factors:Hypertension.  Sonographer:    Elmarie Shiley Dance Referring Phys: [id number] SAMTANI IMPRESSIONS  1. Left ventricular ejection fraction, by estimation, is 25 to 30%. The left ventricle has severely decreased function. The left ventricle demonstrates global hypokinesis. The left ventricular internal cavity size was moderately dilated. Left ventricular diastolic parameters are consistent with Grade II diastolic dysfunction (pseudonormalization). Elevated left  ventricular end-diastolic pressure. There is severe hypokinesis of the left ventricular, entire anteroseptal wall. There is severe akinesis of the left ventricular, mid anteroseptal wall.  2. Right ventricular systolic function is mildly reduced. The right ventricular size is normal.  3. The mitral valve is grossly normal. Mild mitral valve regurgitation. No evidence of mitral stenosis.  4. The aortic valve is normal in structure. Aortic valve regurgitation is mild. No aortic stenosis is present. FINDINGS  Left Ventricle: Left ventricular ejection fraction, by estimation, is 25 to 30%. The left ventricle has severely decreased function. The left ventricle demonstrates global hypokinesis. Severe hypokinesis of the left ventricular, entire anteroseptal wall. Severe akinesis of the left ventricular, mid anteroseptal wall. The left ventricular internal cavity size was moderately dilated. There is no left ventricular hypertrophy. Left ventricular diastolic parameters are consistent with Grade II diastolic  dysfunction (pseudonormalization). Elevated left ventricular end-diastolic pressure. Right Ventricle: The right ventricular size is normal. No increase in right ventricular wall thickness. Right ventricular systolic function is mildly reduced. Left Atrium: Left atrial size was normal in size. Right Atrium:  Right atrial size was normal in size. Pericardium: There is no evidence of pericardial effusion. Mitral Valve: The mitral valve is grossly normal. Mild mitral valve regurgitation. No evidence of mitral valve stenosis. Tricuspid Valve: The tricuspid valve is normal in structure. Tricuspid valve regurgitation is mild . No evidence of tricuspid stenosis. Aortic Valve: The aortic valve is normal in structure. Aortic valve regurgitation is mild. Aortic regurgitation PHT measures 358 msec. No aortic stenosis is present. Pulmonic Valve: The pulmonic valve was grossly normal. Pulmonic valve regurgitation is trivial. No evidence of pulmonic stenosis. Aorta: The aortic root and ascending aorta are structurally normal, with no evidence of dilitation. IAS/Shunts: The atrial septum is grossly normal.  LEFT VENTRICLE PLAX 2D LVIDd:         6.12 cm  Diastology LVIDs:         5.16 cm  LV e' lateral:   3.73 cm/s LV PW:         1.07 cm  LV E/e' lateral: 19.1 LV IVS:        0.95 cm  LV e' medial:    3.49 cm/s LVOT diam:     2.50 cm  LV E/e' medial:  20.4 LV SV:         75 LV SV Index:   33 LVOT Area:     4.91 cm  RIGHT VENTRICLE             IVC RV Basal diam:  2.78 cm     IVC diam: 1.56 cm RV S prime:     11.20 cm/s TAPSE (M-mode): 1.5 cm LEFT ATRIUM             Index       RIGHT ATRIUM           Index LA diam:        4.80 cm 2.08 cm/m  RA Area:     12.80 cm LA Vol (A2C):   91.0 ml 39.45 ml/m RA Volume:   29.60 ml  12.83 ml/m LA Vol (A4C):   58.5 ml 25.36 ml/m LA Biplane Vol: 73.9 ml 32.04 ml/m  AORTIC VALVE LVOT Vmax:   87.20 cm/s LVOT Vmean:  57.000 cm/s LVOT VTI:    0.153 m AI PHT:      358 msec  AORTA Ao Root diam: 3.80 cm Ao Asc diam:  3.60 cm MITRAL VALVE MV Area (PHT):  4.21 cm    SHUNTS MV Decel Time: 180 msec    Systemic VTI:  0.15 m MV E velocity: 71.30 cm/s  Systemic Diam: 2.50 cm MV A velocity: 56.30 cm/s MV E/A ratio:  1.27 Mertie Moores MD Electronically signed by Mertie Moores MD Signature Date/Time:  09/25/2019/11:36:37 AM    Final     Cardiac Studies:  ECG:  Orders placed or performed during the hospital encounter of 09/24/19  . ED EKG  . ED EKG  . EKG 12-Lead  . EKG 12-Lead  . ED EKG  . ED EKG  . EKG 12-Lead  . EKG 12-Lead  . EKG  . EKG 12-Lead  . EKG 12-Lead     Telemetry:  NSR rates 90-100 bpm  Echo: EF 25-30% diffuse hypokinesis   Medications:   . aspirin EC  81 mg Oral Daily  . carvedilol  12.5 mg Oral BID WC  . diazepam  5 mg Oral Once  . folic acid  1 mg Oral Daily  . ibuprofen  800 mg Oral TID  . multivitamin with minerals  1 tablet Oral Daily  . sacubitril-valsartan  1 tablet Oral BID  . sodium chloride flush  3 mL Intravenous Q12H  . sodium chloride flush  3 mL Intravenous Q12H  . thiamine  100 mg Oral Daily   Or  . thiamine  100 mg Intravenous Daily     . sodium chloride    . cefTRIAXone (ROCEPHIN)  IV 2 g (09/26/19 0606)  . vancomycin 1,500 mg (09/25/19 2115)    Assessment/Plan:   1. CHF:  Cath with no CAD. Non ischemic DCM ? From previous drug use/ ETOH vs poorly controlled HTN  Cardiac MRI today Premed with valium Increase coreg and Entresto Discussed need to eliminate drugs/ETOH and be compliant with meds if he wants his EF to improve  Jenkins Rouge 09/26/2019, 8:20 AM

## 2019-09-26 NOTE — Care Management (Signed)
1252 09-26-19 Case Manager received consult for substance abuse resources- patient has declined theses services at this time. Case Manager discussed primary care provider with patient- he wants Case Manager to schedule an appointment at the [facility]- appointment to be placed on the after visit summary (AVS). Patient is aware of the community health and wellness clinic [facility]- to get medication assistance from this location-cost ranges from $4.00-$10.00. If patient transitions home on new medications, he will benefit from them e-scribed to transitions of care pharmacy. Case Manager can see if patient is eligible for MATCH. Case Manager discussed Sherryll Burger with patient- he would need assistance via the company if he continues on this medication. Case Manager will continue to follow for additional transition of care needs. Gala Lewandowsky, RN,BSN Case Manager

## 2019-09-27 DIAGNOSIS — E876 Hypokalemia: Secondary | ICD-10-CM

## 2019-09-27 DIAGNOSIS — D72829 Elevated white blood cell count, unspecified: Secondary | ICD-10-CM

## 2019-09-27 DIAGNOSIS — F1010 Alcohol abuse, uncomplicated: Secondary | ICD-10-CM | POA: Diagnosis present

## 2019-09-27 LAB — CBC WITH DIFFERENTIAL/PLATELET
Abs Immature Granulocytes: 0.05 10*3/uL (ref 0.00–0.07)
Basophils Absolute: 0.1 10*3/uL (ref 0.0–0.1)
Basophils Relative: 1 %
Eosinophils Absolute: 0.1 10*3/uL (ref 0.0–0.5)
Eosinophils Relative: 1 %
HCT: 50.1 % (ref 39.0–52.0)
Hemoglobin: 17 g/dL (ref 13.0–17.0)
Immature Granulocytes: 1 %
Lymphocytes Relative: 22 %
Lymphs Abs: 2.3 10*3/uL (ref 0.7–4.0)
MCH: 33.3 pg (ref 26.0–34.0)
MCHC: 33.9 g/dL (ref 30.0–36.0)
MCV: 98 fL (ref 80.0–100.0)
Monocytes Absolute: 0.7 10*3/uL (ref 0.1–1.0)
Monocytes Relative: 7 %
Neutro Abs: 7.2 10*3/uL (ref 1.7–7.7)
Neutrophils Relative %: 68 %
Platelets: 267 10*3/uL (ref 150–400)
RBC: 5.11 MIL/uL (ref 4.22–5.81)
RDW: 13.8 % (ref 11.5–15.5)
WBC: 10.5 10*3/uL (ref 4.0–10.5)
nRBC: 0 % (ref 0.0–0.2)

## 2019-09-27 LAB — EHRLICHIA ANTIBODY PANEL
E chaffeensis (HGE) Ab, IgG: NEGATIVE
E chaffeensis (HGE) Ab, IgM: NEGATIVE
E. Chaffeensis (HME) IgM Titer: NEGATIVE
E.Chaffeensis (HME) IgG: NEGATIVE

## 2019-09-27 LAB — MAGNESIUM: Magnesium: 1.9 mg/dL (ref 1.7–2.4)

## 2019-09-27 LAB — HEPATITIS PANEL, ACUTE
HCV Ab: NONREACTIVE
Hep A IgM: NONREACTIVE
Hep B C IgM: NONREACTIVE
Hepatitis B Surface Ag: NONREACTIVE

## 2019-09-27 LAB — COMPREHENSIVE METABOLIC PANEL
ALT: 125 U/L — ABNORMAL HIGH (ref 0–44)
AST: 112 U/L — ABNORMAL HIGH (ref 15–41)
Albumin: 3.7 g/dL (ref 3.5–5.0)
Alkaline Phosphatase: 99 U/L (ref 38–126)
Anion gap: 12 (ref 5–15)
BUN: 12 mg/dL (ref 6–20)
CO2: 22 mmol/L (ref 22–32)
Calcium: 8.7 mg/dL — ABNORMAL LOW (ref 8.9–10.3)
Chloride: 101 mmol/L (ref 98–111)
Creatinine, Ser: 0.85 mg/dL (ref 0.61–1.24)
GFR calc Af Amer: >60 mL/min (ref >60)
GFR calc non Af Amer: >60 mL/min (ref >60)
Glucose, Bld: 99 mg/dL (ref 70–99)
Potassium: 3.3 mmol/L — ABNORMAL LOW (ref 3.5–5.1)
Sodium: 135 mmol/L (ref 135–145)
Total Bilirubin: 2.1 mg/dL — ABNORMAL HIGH (ref 0.3–1.2)
Total Protein: 7.1 g/dL (ref 6.5–8.1)

## 2019-09-27 LAB — HIV ANTIBODY (ROUTINE TESTING W REFLEX): HIV Screen 4th Generation wRfx: NONREACTIVE

## 2019-09-27 LAB — PHOSPHORUS: Phosphorus: 2.2 mg/dL — ABNORMAL LOW (ref 2.5–4.6)

## 2019-09-27 MED ORDER — FOLIC ACID 1 MG PO TABS
1.0000 mg | ORAL_TABLET | Freq: Every day | ORAL | Status: DC
  Administered 2019-09-27 – 2019-09-28 (×2): 1 mg via ORAL
  Filled 2019-09-27 (×2): qty 1

## 2019-09-27 MED ORDER — POTASSIUM CHLORIDE CRYS ER 20 MEQ PO TBCR
40.0000 meq | EXTENDED_RELEASE_TABLET | Freq: Once | ORAL | Status: AC
  Administered 2019-09-27: 40 meq via ORAL
  Filled 2019-09-27: qty 2

## 2019-09-27 MED ORDER — SPIRONOLACTONE 12.5 MG HALF TABLET
12.5000 mg | ORAL_TABLET | Freq: Every day | ORAL | Status: DC
  Administered 2019-09-27 – 2019-09-28 (×2): 12.5 mg via ORAL
  Filled 2019-09-27 (×2): qty 1

## 2019-09-27 MED ORDER — POTASSIUM PHOSPHATES 15 MMOLE/5ML IV SOLN
30.0000 mmol | Freq: Once | INTRAVENOUS | Status: AC
  Administered 2019-09-27: 30 mmol via INTRAVENOUS
  Filled 2019-09-27: qty 10

## 2019-09-27 MED ORDER — LORAZEPAM 2 MG/ML IJ SOLN: 0.0000 mg | Freq: Two times a day (BID) | INTRAMUSCULAR | Status: DC

## 2019-09-27 MED ORDER — LORAZEPAM 2 MG/ML IJ SOLN: 1.0000 mg | INTRAMUSCULAR | Status: DC | PRN

## 2019-09-27 MED ORDER — NYSTATIN 100000 UNIT/ML MT SUSP
5.0000 mL | Freq: Four times a day (QID) | OROMUCOSAL | Status: DC
  Administered 2019-09-27 – 2019-09-28 (×2): 500000 [IU] via ORAL
  Filled 2019-09-27 (×2): qty 5

## 2019-09-27 MED ORDER — LORAZEPAM 1 MG PO TABS: 1.0000 mg | ORAL_TABLET | ORAL | Status: DC | PRN

## 2019-09-27 MED ORDER — LORAZEPAM 2 MG/ML IJ SOLN
0.0000 mg | Freq: Four times a day (QID) | INTRAMUSCULAR | Status: DC
  Administered 2019-09-27: 2 mg via INTRAVENOUS
  Filled 2019-09-27: qty 1

## 2019-09-27 NOTE — Progress Notes (Signed)
Subjective:  Breathing better Wife in room this am Discussed diagnosis of non ischemic DCM and need for compliance with meds And stop all drugs/alcohol   Objective:  Vitals:   09/26/19 2021 09/27/19 0003 09/27/19 0503 09/27/19 0757  BP: (!) 145/101 (!) 133/103 (!) 146/113 (!) 147/120  Pulse: 87 (!) 102 93 91  Resp: 16 15 16 16   Temp: 98.5 F (36.9 C) 98.1 F (36.7 C) 98.7 F (37.1 C) 98.6 F (37 C)  TempSrc: Oral Oral Oral Oral  SpO2: 100% 100% 97% 97%  Weight:   101.7 kg   Height:        Intake/Output from previous day:  Intake/Output Summary (Last 24 hours) at 09/27/2019 0935 Last data filed at 09/27/2019 09/29/2019 Gross per 24 hour  Intake 483 ml  Output 725 ml  Net -242 ml    Physical Exam: Affect appropriate Healthy:  appears stated age HEENT: normal Neck supple with no adenopathy JVP normal no bruits no thyromegaly Lungs clear with no wheezing and good diaphragmatic motion Heart:  S1/S2 no murmur, no rub, gallop or click PMI  Enlarged  Abdomen: benighn, BS positve, no tenderness, no AAA no bruit.  No HSM or HJR Right radial cath site A  No edema Neuro non-focal Skin warm and dry No muscular weakness   Lab Results: Basic Metabolic Panel: Recent Labs    09/25/19 1303 09/25/19 1303 09/26/19 0416 09/27/19 0452  NA 137  --  136  --   K 3.1*  --  3.6  --   CL 100  --  102  --   CO2 25  --  21*  --   GLUCOSE 112*  --  99  --   BUN 6  --  7  --   CREATININE 0.91  --  0.89  --   CALCIUM 8.6*  --  8.6*  --   MG 1.4*   < > 2.0 1.9   < > = values in this interval not displayed.   Liver Function Tests: Recent Labs    09/25/19 0715  AST 147*  ALT 158*  ALKPHOS 98  BILITOT 2.6*  PROT 7.2  ALBUMIN 3.5   Recent Labs    09/25/19 0715  LIPASE 37   CBC: Recent Labs    09/25/19 0715 09/25/19 0715 09/26/19 0416 09/27/19 0452  WBC 12.7*   < > 13.9* 10.5  NEUTROABS 9.0*  --   --  7.2  HGB 15.8   < > 16.7 17.0  HCT 47.2   < > 49.3 50.1  MCV  97.9   < > 97.2 98.0  PLT 247   < > 248 267   < > = values in this interval not displayed.   Cardiac Enzymes: Recent Labs    09/25/19 0715  CKTOTAL 267   Fasting Lipid Panel: Recent Labs    09/26/19 0416  CHOL 287*  HDL 33*  LDLCALC 222*  TRIG 158*  CHOLHDL 8.7   Thyroid Function Tests: Recent Labs    09/25/19 0715  TSH 2.911    Imaging: CARDIAC CATHETERIZATION  Result Date: 09/25/2019 Derrick Hoffman is a 44 y.o. male  55 LOCATION:  FACILITY: [facility] PHYSICIAN: [id number], M.D. Sep 24, 1975 DATE OF PROCEDURE:  09/25/2019 DATE OF DISCHARGE: CARDIAC CATHETERIZATION History obtained from chart review.Derrick Hoffman is a 44 y.o. male with a hx of HTN, tobacco abuse, habitual ETOH abuse, abnormal LFTs, diffuse hepatic steatosis by imaging, aortic atherosclerosis by CT 05/2019,  hypokalemia who is being seen today for the evaluation of chest pain, abnormal EKG and elevated troponin  his EKG showed inferior and anterolateral T wave inversion.  His 2D echo revealed severe LV dysfunction with an EF in the 25% range.   Derrick Hoffman has clean coronary arteries, moderate elevation of LVEDP and severe LV dysfunction consistent with a nonischemic cardiomyopathy potentially related to hypertensive heart disease and/or excessive alcohol intake.  Guideline directed optimal medical therapy will be recommended including carvedilol, Entresto, spironolactone as he has already been recommended in the chart.  Cardiac risk factor modification including smoking cessation and avoidance of illicit drugs including alcohol.  The sheath was removed and a TR band was placed on the right wrist to achieve patent hemostasis.  The patient left lab in stable condition.  I am concerned that medical compliance will be an issue. Derrick Hoffman. MD, [facility] 09/25/2019 3:13 PM   MR CARDIAC MORPHOLOGY W WO CONTRAST  Result Date: 09/26/2019 CLINICAL DATA:  Cardiomyopathy EXAM: CARDIAC MRI TECHNIQUE: The patient was scanned on a  1.5 Tesla Siemens magnet. A dedicated cardiac coil was used. Functional imaging was done using Fiesta sequences. 2,3, and 4 chamber views were done to assess for RWMA's. Modified Simpson's rule using a short axis stack was used to calculate an ejection fraction on a dedicated work [employer]. The patient received 10 cc of Gadavist. After 10 minutes inversion recovery sequences were used to assess for infiltration and scar tissue. CONTRAST:  Gadavist FINDINGS: Mild LAE Normal RA/RV. No pericardial effusion. No ASD/PFO. Normal MV, AV and TV. Severe LVE with diffuse hypokinesis No defined RWMA;s. Quantitative EF 20% (EDV 221 cc ESV 176 cc SV 45 cc) No LAA thrombus. Delayed gadolinium images normal with no evidence of infarct, infiltration or myocarditis IMPRESSION: 1. Severe LVE with diffuse hypokinesis EF 20% 2. No delayed gadolinium uptake on inversion recovery sequences. Findings consistent with non ischemic cardiomyopathy 3.  Mild LAE 4.  Normla RV size and function Charlton Haws Electronically Signed   By: Charlton Haws M.D.   On: 09/26/2019 12:57   ECHOCARDIOGRAM COMPLETE  Result Date: 09/25/2019    ECHOCARDIOGRAM REPORT   Patient Name:   Derrick Hoffman [place] Date of Exam: 09/25/2019 Medical Rec #:  [id number]     Height:       74.0 in Accession #:    [id number]    Weight:       230.0 lb Date of Birth:  08/25/1975     BSA:          2.306 m Patient Age:    44 years      BP:           161/102 mmHg Patient Gender: M             HR:           95 bpm. Exam Location:  Inpatient Procedure: 2D Echo, Cardiac Doppler and Color Doppler                     STAT ECHO Reported to: Dr. Elease Hashimoto on 09/25/2019 10:26:00 AM. Indications:    Acute coronary syndrome I24.9  History:        Patient has no prior history of Echocardiogram examinations.                 Risk Factors:Hypertension.  Sonographer:    Elmarie Shiley Dance Referring Phys: [id number] SAMTANI IMPRESSIONS  1. Left ventricular ejection fraction, by  estimation, is 25 to 30%. The left ventricle has severely decreased function. The left ventricle demonstrates global hypokinesis. The left ventricular internal cavity size was moderately dilated. Left ventricular diastolic parameters are consistent with Grade II diastolic dysfunction (pseudonormalization). Elevated left ventricular end-diastolic pressure. There is severe hypokinesis of the left ventricular, entire anteroseptal wall. There is severe akinesis of the left ventricular, mid anteroseptal wall.  2. Right ventricular systolic function is mildly reduced. The right ventricular size is normal.  3. The mitral valve is grossly normal. Mild mitral valve regurgitation. No evidence of mitral stenosis.  4. The aortic valve is normal in structure. Aortic valve regurgitation is mild. No aortic stenosis is present. FINDINGS  Left Ventricle: Left ventricular ejection fraction, by estimation, is 25 to 30%. The left ventricle has severely decreased function. The left ventricle demonstrates global hypokinesis. Severe hypokinesis of the left ventricular, entire anteroseptal wall. Severe akinesis of the left ventricular, mid anteroseptal wall. The left ventricular internal cavity size was moderately dilated. There is no left ventricular hypertrophy. Left ventricular diastolic parameters are consistent with Grade II diastolic  dysfunction (pseudonormalization). Elevated left ventricular end-diastolic pressure. Right Ventricle: The right ventricular size is normal. No increase in right ventricular wall thickness. Right ventricular systolic function is mildly reduced. Left Atrium: Left atrial size was normal in size. Right Atrium: Right atrial size was normal in size. Pericardium: There is no evidence of pericardial effusion. Mitral Valve: The mitral valve is grossly normal. Mild mitral valve regurgitation. No evidence of mitral valve stenosis. Tricuspid Valve: The tricuspid valve is normal in structure. Tricuspid valve  regurgitation is mild . No evidence of tricuspid stenosis. Aortic Valve: The aortic valve is normal in structure. Aortic valve regurgitation is mild. Aortic regurgitation PHT measures 358 msec. No aortic stenosis is present. Pulmonic Valve: The pulmonic valve was grossly normal. Pulmonic valve regurgitation is trivial. No evidence of pulmonic stenosis. Aorta: The aortic root and ascending aorta are structurally normal, with no evidence of dilitation. IAS/Shunts: The atrial septum is grossly normal.  LEFT VENTRICLE PLAX 2D LVIDd:         6.12 cm  Diastology LVIDs:         5.16 cm  LV e' lateral:   3.73 cm/s LV PW:         1.07 cm  LV E/e' lateral: 19.1 LV IVS:        0.95 cm  LV e' medial:    3.49 cm/s LVOT diam:     2.50 cm  LV E/e' medial:  20.4 LV SV:         75 LV SV Index:   33 LVOT Area:     4.91 cm  RIGHT VENTRICLE             IVC RV Basal diam:  2.78 cm     IVC diam: 1.56 cm RV S prime:     11.20 cm/s TAPSE (M-mode): 1.5 cm LEFT ATRIUM             Index       RIGHT ATRIUM           Index LA diam:        4.80 cm 2.08 cm/m  RA Area:     12.80 cm LA Vol (A2C):   91.0 ml 39.45 ml/m RA Volume:   29.60 ml  12.83 ml/m LA Vol (A4C):   58.5 ml 25.36 ml/m LA Biplane Vol: 73.9 ml 32.04 ml/m  AORTIC VALVE LVOT Vmax:  87.20 cm/s LVOT Vmean:  57.000 cm/s LVOT VTI:    0.153 m AI PHT:      358 msec  AORTA Ao Root diam: 3.80 cm Ao Asc diam:  3.60 cm MITRAL VALVE MV Area (PHT): 4.21 cm    SHUNTS MV Decel Time: 180 msec    Systemic VTI:  0.15 m MV E velocity: 71.30 cm/s  Systemic Diam: 2.50 cm MV A velocity: 56.30 cm/s MV E/A ratio:  1.27 Mertie Moores MD Electronically signed by Mertie Moores MD Signature Date/Time: 09/25/2019/11:36:37 AM    Final     Cardiac Studies:  ECG:  Orders placed or performed during the hospital encounter of 09/24/19  . ED EKG  . ED EKG  . EKG 12-Lead  . EKG 12-Lead  . ED EKG  . ED EKG  . EKG 12-Lead  . EKG 12-Lead  . EKG  . EKG 12-Lead  . EKG 12-Lead     Telemetry:  NSR  rates 90-100 bpm  Echo: EF 25-30% diffuse hypokinesis   Medications:   . aspirin EC  81 mg Oral Daily  . carvedilol  12.5 mg Oral BID WC  . doxycycline  100 mg Oral Q12H  . multivitamin with minerals  1 tablet Oral Daily  . pneumococcal 23 valent vaccine  0.5 mL Intramuscular Tomorrow-1000  . sacubitril-valsartan  1 tablet Oral BID  . sodium chloride flush  3 mL Intravenous Q12H  . sodium chloride flush  3 mL Intravenous Q12H  . thiamine  100 mg Oral Daily   Or  . thiamine  100 mg Intravenous Daily     . sodium chloride      Assessment/Plan:   1. CHF:  Cath with no CAD. Non ischemic DCM ? From previous drug use/ ETOH vs poorly controlled HTN  Cardiac MRI showed no infiltration, scar or evidence of amyloid. Continue entresto, coreg add aldactone Ok to d/c from our standpoint will arrange outpatient f/u in CHF clinic   Jenkins Rouge 09/27/2019, 9:35 AM

## 2019-09-27 NOTE — Progress Notes (Signed)
PROGRESS NOTE    Derrick Hoffman  ZOX:[id number] DOB: 1976-02-27 DOA: 09/24/2019 PCP: Patient, No Pcp Per    No chief complaint on file.   Brief Narrative:  44 year old black male prior appendectomy 01/05/2018 Chronic EtOH, tobacco, cannabis use Recent left tripod facial fracture Known hiatal hernia with multiple emergency room visits 08/2023/2020 nausea vomiting abdominal pain Seen in the emergency room most recently 08/16/2019 cellulitis right lower extremity? Tick bite-Rx doxycycline counseled to follow-up with PCP  Returns 09/25/2019-SIRS criteria met WBC 13 lactic acid 1.8 + chest pain  Cardiology consulted 2/2 worsening T wave inversions that occurred dynamically across precordial leads later in the day and he went for semiemergent cath which showed no acute blockages and was consistent with NICM  He was taken over by the cardiology service however because of persisting concerns with regards to possibility of infection and knee pain we are seeing this afternoon   Assessment & Plan:   Principal Problem:   SIRS (systemic inflammatory response syndrome) ([place]) Active Problems:   Hypertensive urgency   Tick bite   Chest pain   NICM (nonischemic cardiomyopathy) (HCC)   Elevated troponin   Alcohol abuse   Leukocytosis   Hypokalemia   Hypophosphatemia   1 nonischemic cardiomyopathy/elevated troponin Felt likely secondary to ongoing alcohol use/polysubstance use in the setting of hypertensive urgency/poorly controlled hypertension.  Patient being followed by cardiology.  2D echo done with a EF of 25 to 30% with hypokinesis of the left ventricular/anteroseptal akinesis.  Cardiac MRI which was done was confirmatory for nonischemic cardiomyopathy with no signs of infiltration, scar or evidence of amyloid noted.  Patient underwent catheterization with clean coronaries noted.  Continue aspirin, Coreg, Entresto, Aldactone per cardiology recommendations.  Alcohol cessation stressed to  patient.  Outpatient follow-up with cardiology.  2.  Alcohol abuse Alcohol cessation stressed to patient.  Per RN this morning concerns for possible alcohol withdrawal as patient noted to be diaphoretic, headache, nausea per RN which improved after patient started back on the Ativan withdrawal protocol.  Continue thiamine, folic acid, multivitamin.  3.  Hypertensive urgency Likely secondary to noncompliance.  Blood pressure improved on current regimen of Coreg, Entresto.  Aldactone added to regimen.  Will need outpatient follow-up.  4.?  Chronic leukocytosis Patient noted to have a leukocytosis dating back to January 2021.  Patient noted to have recently been prescribed doxycycline for concern for [facility] spotted fever and per prior MD noted to have only finished about half the dosing.  Per patient's wife patient did not complete full course.  Patient started back on doxycycline 100 mg twice daily which will continue to complete a 10 to 14-day course of treatment.  Titers for Lyme and [facility] spotted fever pending.  Leukocytosis trending down.  Supportive care.  5.  Hypokalemia/hypophosphatemia Replete.  6.  Transaminitis Questionable etiology.  CT angio chest done consistent with hepatic steatosis.  Patient with no abdominal pain.  Acute hepatitis panel negative.  Supportive care.  Alcohol cessation stressed to patient.  Outpatient follow-up.  7.  Hyperlipidemia LDL of 222.  Patient with a transaminitis and hepatic steatosis.  We will hold off on statin statin at this time.  Will need outpatient follow-up with statin may be started once LFTs have improved.   DVT prophylaxis: SCDs Code Status: Full Family Communication: Updated patient and wife at bedside. Disposition:   Status is: Inpatient    Dispo: The patient is from: Home  Anticipated d/c is to: Home              Anticipated d/c date is: In 1 to 2 days if continued improvement and no further withdrawal  symptoms.              Patient currently with concerns for possible withdrawal per RN today with some diaphoresis, nausea and headache, patient hypokalemic and hypophosphatemic.        Consultants:   Cardiology: Dr. Johnsie Cancel 09/25/2019  Procedures:   2D echo 09/25/2019  Cardiac catheterization 09/25/2019  CT angiogram chest 09/24/2019  Chest x-ray 09/24/2019  Cardiac MRI 09/26/2019  Antimicrobials:   Doxycycline 09/26/2019   Subjective: Patient laying in bed.  Somewhat flat affect.  Denies any shortness of breath currently.  Stated had some chest pain earlier on which has since resolved after being given his blood pressure medications with improvement with blood pressure per patient.  Per RN patient noted with some diaphoresis, headache, some nausea concerning for withdrawal which improved after receiving some IV Ativan.  Wife at bedside.  Objective: Vitals:   09/27/19 1016 09/27/19 1200 09/27/19 1513 09/27/19 1735  BP: (!) 158/109 (!) 145/95 (!) 147/110 (!) 160/112  Pulse: 100 94 94 95  Resp:   16   Temp:   98.9 F (37.2 C)   TempSrc:   Oral   SpO2:   98%   Weight:      Height:        Intake/Output Summary (Last 24 hours) at 09/27/2019 1835 Last data filed at 09/27/2019 1538 Gross per 24 hour  Intake 666.36 ml  Output 225 ml  Net 441.36 ml   Filed Weights   09/25/19 1304 09/26/19 0339 09/27/19 0503  Weight: 104.3 kg 102.5 kg 101.7 kg    Examination:  General exam: Appears calm and comfortable  Respiratory system: Clear to auscultation. Respiratory effort normal. Cardiovascular system: S1 & S2 heard, RRR. No JVD, murmurs, rubs, gallops or clicks. No pedal edema. Gastrointestinal system: Abdomen is nondistended, soft and nontender. No organomegaly or masses felt. Normal bowel sounds heard. Central nervous system: Alert and oriented. No focal neurological deficits. Extremities: Symmetric 5 x 5 power. Skin: No rashes, lesions or ulcers Psychiatry: Judgement and  insight appear normal. Mood & affect appropriate.     Data Reviewed: I have personally reviewed following labs and imaging studies  CBC: Recent Labs  Lab 09/24/19 1912 09/25/19 0715 09/26/19 0416 09/27/19 0452  WBC 13.9* 12.7* 13.9* 10.5  NEUTROABS  --  9.0*  --  7.2  HGB 17.0 15.8 16.7 17.0  HCT 50.4 47.2 49.3 50.1  MCV 97.9 97.9 97.2 98.0  PLT 291 247 248 811    Basic Metabolic Panel: Recent Labs  Lab 09/24/19 1912 09/25/19 0715 09/25/19 1303 09/26/19 0416 09/27/19 0452 09/27/19 0841  NA 142 139 137 136  --  135  K 3.3* 2.9* 3.1* 3.6  --  3.3*  CL 103 100 100 102  --  101  CO2 22 24 25  21*  --  22  GLUCOSE 101* 107* 112* 99  --  99  BUN 9 7 6 7   --  12  CREATININE 1.04 1.02 0.91 0.89  --  0.85  CALCIUM 9.1 8.4* 8.6* 8.6*  --  8.7*  MG  --   --  1.4* 2.0 1.9  --   PHOS  --   --   --   --   --  2.2*    GFR: Estimated Creatinine Clearance:  142.6 mL/min (by C-G formula based on SCr of 0.85 mg/dL).  Liver Function Tests: Recent Labs  Lab 09/25/19 0715 09/27/19 0841  AST 147* 112*  ALT 158* 125*  ALKPHOS 98 99  BILITOT 2.6* 2.1*  PROT 7.2 7.1  ALBUMIN 3.5 3.7    CBG: No results for input(s): GLUCAP in the last 168 hours.   Recent Results (from the past 240 hour(s))  SARS Coronavirus 2 by RT PCR (hospital order, performed in [facility] hospital lab) Nasopharyngeal Nasopharyngeal Swab     Status: None   Collection Time: 09/24/19  9:30 PM   Specimen: Nasopharyngeal Swab  Result Value Ref Range Status   SARS Coronavirus 2 NEGATIVE NEGATIVE Final    Comment: (NOTE) SARS-CoV-2 target nucleic acids are NOT DETECTED. The SARS-CoV-2 RNA is generally detectable in upper and lower respiratory specimens during the acute phase of infection. The lowest concentration of SARS-CoV-2 viral copies this assay can detect is 250 copies / mL. A negative result does not preclude SARS-CoV-2 infection and should not be used as the sole basis for treatment or  other patient management decisions.  A negative result may occur with improper specimen collection / handling, submission of specimen other than nasopharyngeal swab, presence of viral mutation(s) within the areas targeted by this assay, and inadequate number of viral copies (<250 copies / mL). A negative result must be combined with clinical observations, patient history, and epidemiological information. Fact Sheet for Patients:   StrictlyIdeas.no Fact Sheet for Healthcare Providers: BankingDealers.co.za This test is not yet approved or cleared  by the [place] FDA and has been authorized for detection and/or diagnosis of SARS-CoV-2 by FDA under an Emergency Use Authorization (EUA).  This EUA will remain in effect (meaning this test can be used) for the duration of the COVID-19 declaration under Section 564(b)(1) of the Act, 21 U.S.C. section 360bbb-3(b)(1), unless the authorization is terminated or revoked sooner. Performed at [facility], [place] [address] [place], [place] [address]   Blood culture (routine x 2)     Status: None (Preliminary result)   Collection Time: 09/24/19 10:21 PM   Specimen: BLOOD LEFT HAND  Result Value Ref Range Status   Specimen Description BLOOD LEFT HAND  Final   Special Requests   Final    BOTTLES DRAWN AEROBIC AND ANAEROBIC Blood Culture adequate volume   Culture   Final    NO GROWTH 3 DAYS Performed at [facility], [place] [address] [place], [place] [address]    Report Status PENDING  Incomplete  Blood culture (routine x 2)     Status: Abnormal (Preliminary result)   Collection Time: 09/24/19 10:34 PM   Specimen: BLOOD  Result Value Ref Range Status   Specimen Description BLOOD RIGHT ANTECUBITAL  Final   Special Requests   Final    BOTTLES DRAWN AEROBIC AND ANAEROBIC Blood Culture adequate volume   Culture  Setup Time   Final    GRAM POSITIVE COCCI IN CLUSTERS AEROBIC BOTTLE  ONLY Organism ID to follow CRITICAL RESULT CALLED TO, READ BACK BY AND VERIFIED WITH: Tillman Sers PharmD 15:55 09/26/19 ([email or web address])    Culture (A)  Final    STAPHYLOCOCCUS SPECIES (COAGULASE NEGATIVE) THE SIGNIFICANCE OF ISOLATING THIS ORGANISM FROM A SINGLE SET OF BLOOD CULTURES WHEN MULTIPLE SETS ARE DRAWN IS UNCERTAIN. PLEASE NOTIFY THE MICROBIOLOGY DEPARTMENT WITHIN ONE WEEK IF SPECIATION AND SENSITIVITIES ARE REQUIRED. Performed at [facility], [place] [address] [place], [place] [address]  Report Status PENDING  Incomplete  Blood Culture ID Panel (Reflexed)     Status: Abnormal   Collection Time: 09/24/19 10:34 PM  Result Value Ref Range Status   Enterococcus species NOT DETECTED NOT DETECTED Final   Listeria monocytogenes NOT DETECTED NOT DETECTED Final   Staphylococcus species DETECTED (A) NOT DETECTED Final    Comment: Methicillin (oxacillin) susceptible coagulase negative staphylococcus. Possible blood culture contaminant (unless isolated from more than one blood culture draw or clinical case suggests pathogenicity). No antibiotic treatment is indicated for blood  culture contaminants. CRITICAL RESULT CALLED TO, READ BACK BY AND VERIFIED WITH: Tillman Sers PharmD 15:55 09/26/19 ([email or web address])    Staphylococcus aureus (BCID) NOT DETECTED NOT DETECTED Final   Methicillin resistance NOT DETECTED NOT DETECTED Final   Streptococcus species NOT DETECTED NOT DETECTED Final   Streptococcus agalactiae NOT DETECTED NOT DETECTED Final   Streptococcus pneumoniae NOT DETECTED NOT DETECTED Final   Streptococcus pyogenes NOT DETECTED NOT DETECTED Final   Acinetobacter baumannii NOT DETECTED NOT DETECTED Final   Enterobacteriaceae species NOT DETECTED NOT DETECTED Final   Enterobacter cloacae complex NOT DETECTED NOT DETECTED Final   Escherichia coli NOT DETECTED NOT DETECTED Final   Klebsiella oxytoca NOT DETECTED NOT DETECTED Final   Klebsiella pneumoniae NOT DETECTED NOT DETECTED Final    Proteus species NOT DETECTED NOT DETECTED Final   Serratia marcescens NOT DETECTED NOT DETECTED Final   Haemophilus influenzae NOT DETECTED NOT DETECTED Final   Neisseria meningitidis NOT DETECTED NOT DETECTED Final   Pseudomonas aeruginosa NOT DETECTED NOT DETECTED Final   Candida albicans NOT DETECTED NOT DETECTED Final   Candida glabrata NOT DETECTED NOT DETECTED Final   Candida krusei NOT DETECTED NOT DETECTED Final   Candida parapsilosis NOT DETECTED NOT DETECTED Final   Candida tropicalis NOT DETECTED NOT DETECTED Final    Comment: Performed at [facility], [address], [place], [place] [address]         Radiology Studies: MR CARDIAC MORPHOLOGY W WO CONTRAST  Result Date: 09/26/2019 CLINICAL DATA:  Cardiomyopathy EXAM: CARDIAC MRI TECHNIQUE: The patient was scanned on a 1.5 Tesla Siemens magnet. A dedicated cardiac coil was used. Functional imaging was done using Fiesta sequences. 2,3, and 4 chamber views were done to assess for RWMA's. Modified Simpson's rule using a short axis stack was used to calculate an ejection fraction on a dedicated work Conservation officer, nature. The patient received 10 cc of Gadavist. After 10 minutes inversion recovery sequences were used to assess for infiltration and scar tissue. CONTRAST:  Gadavist FINDINGS: Mild LAE Normal RA/RV. No pericardial effusion. No ASD/PFO. Normal MV, AV and TV. Severe LVE with diffuse hypokinesis No defined RWMA;s. Quantitative EF 20% (EDV 221 cc ESV 176 cc SV 45 cc) No LAA thrombus. Delayed gadolinium images normal with no evidence of infarct, infiltration or myocarditis IMPRESSION: 1. Severe LVE with diffuse hypokinesis EF 20% 2. No delayed gadolinium uptake on inversion recovery sequences. Findings consistent with non ischemic cardiomyopathy 3.  Mild LAE 4.  Normla RV size and function Jenkins Rouge Electronically Signed   By: Jenkins Rouge M.D.   On: 09/26/2019 12:57        Scheduled Meds: . aspirin EC  81  mg Oral Daily  . carvedilol  12.5 mg Oral BID WC  . doxycycline  100 mg Oral Q12H  . folic acid  1 mg Oral Daily  . LORazepam  0-4 mg Intravenous Q6H   Followed by  . [START  ON 09/29/2019] LORazepam  0-4 mg Intravenous Q12H  . multivitamin with minerals  1 tablet Oral Daily  . nystatin  5 mL Oral QID  . pneumococcal 23 valent vaccine  0.5 mL Intramuscular Tomorrow-1000  . sacubitril-valsartan  1 tablet Oral BID  . sodium chloride flush  3 mL Intravenous Q12H  . sodium chloride flush  3 mL Intravenous Q12H  . spironolactone  12.5 mg Oral Daily  . thiamine  100 mg Oral Daily   Or  . thiamine  100 mg Intravenous Daily   Continuous Infusions: . sodium chloride    . potassium PHOSPHATE IVPB (in mmol) 30 mmol (09/27/19 1254)     LOS: 2 days    Time spent: 35 minutes    Irine Seal, MD [facility]   To contact the attending provider between 7A-7P or the covering provider during after hours 7P-7A, please log into the web site [email or web address] and access using universal [place] password for that web site. If you do not have the password, please call the hospital operator.  09/27/2019, 6:35 PM

## 2019-09-28 DIAGNOSIS — I161 Hypertensive emergency: Secondary | ICD-10-CM

## 2019-09-28 LAB — LYME DISEASE, WESTERN BLOT
IgG P18 Ab.: ABSENT
IgG P23 Ab.: ABSENT
IgG P28 Ab.: ABSENT
IgG P30 Ab.: ABSENT
IgG P39 Ab.: ABSENT
IgG P41 Ab.: ABSENT
IgG P45 Ab.: ABSENT
IgG P58 Ab.: ABSENT
IgG P66 Ab.: ABSENT
IgG P93 Ab.: ABSENT
IgM P41 Ab.: ABSENT
Lyme IgG Wb: NEGATIVE
Lyme IgM Wb: POSITIVE — AB

## 2019-09-28 LAB — ALDOSTERONE + RENIN ACTIVITY W/ RATIO
ALDO / PRA Ratio: <3.3 (ref 0.0–30.0)
Aldosterone: <1 ng/dL (ref 0.0–30.0)
PRA LC/MS/MS: 0.3 ng/mL/hr (ref 0.167–5.380)

## 2019-09-28 LAB — COMPREHENSIVE METABOLIC PANEL
ALT: 196 U/L — ABNORMAL HIGH (ref 0–44)
AST: 209 U/L — ABNORMAL HIGH (ref 15–41)
Albumin: 3.8 g/dL (ref 3.5–5.0)
Alkaline Phosphatase: 91 U/L (ref 38–126)
Anion gap: 13 (ref 5–15)
BUN: 13 mg/dL (ref 6–20)
CO2: 20 mmol/L — ABNORMAL LOW (ref 22–32)
Calcium: 9 mg/dL (ref 8.9–10.3)
Chloride: 102 mmol/L (ref 98–111)
Creatinine, Ser: 1.03 mg/dL (ref 0.61–1.24)
GFR calc Af Amer: >60 mL/min (ref >60)
GFR calc non Af Amer: >60 mL/min (ref >60)
Glucose, Bld: 100 mg/dL — ABNORMAL HIGH (ref 70–99)
Potassium: 3.7 mmol/L (ref 3.5–5.1)
Sodium: 135 mmol/L (ref 135–145)
Total Bilirubin: 1.7 mg/dL — ABNORMAL HIGH (ref 0.3–1.2)
Total Protein: 7.4 g/dL (ref 6.5–8.1)

## 2019-09-28 LAB — CBC WITH DIFFERENTIAL/PLATELET
Abs Immature Granulocytes: 0.04 10*3/uL (ref 0.00–0.07)
Basophils Absolute: 0.1 10*3/uL (ref 0.0–0.1)
Basophils Relative: 1 %
Eosinophils Absolute: 0.1 10*3/uL (ref 0.0–0.5)
Eosinophils Relative: 1 %
HCT: 49.7 % (ref 39.0–52.0)
Hemoglobin: 16.9 g/dL (ref 13.0–17.0)
Immature Granulocytes: 0 %
Lymphocytes Relative: 25 %
Lymphs Abs: 2.7 10*3/uL (ref 0.7–4.0)
MCH: 33.3 pg (ref 26.0–34.0)
MCHC: 34 g/dL (ref 30.0–36.0)
MCV: 98 fL (ref 80.0–100.0)
Monocytes Absolute: 1 10*3/uL (ref 0.1–1.0)
Monocytes Relative: 9 %
Neutro Abs: 6.9 10*3/uL (ref 1.7–7.7)
Neutrophils Relative %: 64 %
Platelets: 286 10*3/uL (ref 150–400)
RBC: 5.07 MIL/uL (ref 4.22–5.81)
RDW: 13.9 % (ref 11.5–15.5)
WBC: 10.9 10*3/uL — ABNORMAL HIGH (ref 4.0–10.5)
nRBC: 0 % (ref 0.0–0.2)

## 2019-09-28 LAB — CULTURE, BLOOD (ROUTINE X 2): Special Requests: ADEQUATE

## 2019-09-28 LAB — MAGNESIUM: Magnesium: 1.9 mg/dL (ref 1.7–2.4)

## 2019-09-28 LAB — ROCKY MTN SPOTTED FVR ABS PNL(IGG+IGM)
RMSF IgG: NEGATIVE
RMSF IgM: 0.57 index (ref 0.00–0.89)

## 2019-09-28 MED ORDER — SPIRONOLACTONE 25 MG PO TABS: 12.5000 mg | ORAL_TABLET | Freq: Every day | ORAL | 1 refills | Status: DC

## 2019-09-28 MED ORDER — FOLIC ACID 1 MG PO TABS: 1.0000 mg | ORAL_TABLET | Freq: Every day | ORAL | 1 refills | Status: DC

## 2019-09-28 MED ORDER — PANTOPRAZOLE SODIUM 40 MG PO TBEC
40.0000 mg | DELAYED_RELEASE_TABLET | Freq: Every day | ORAL | 1 refills | Status: DC
Start: 2019-09-28 — End: 2020-03-05

## 2019-09-28 MED ORDER — CARVEDILOL 12.5 MG PO TABS: 12.5000 mg | ORAL_TABLET | Freq: Two times a day (BID) | ORAL | 1 refills | Status: DC

## 2019-09-28 MED ORDER — NYSTATIN 100000 UNIT/ML MT SUSP: 5.0000 mL | Freq: Four times a day (QID) | OROMUCOSAL | 0 refills | Status: AC

## 2019-09-28 MED ORDER — THIAMINE HCL 100 MG PO TABS: 100.0000 mg | ORAL_TABLET | Freq: Every day | ORAL | 1 refills | Status: DC

## 2019-09-28 MED ORDER — SACUBITRIL-VALSARTAN 49-51 MG PO TABS: 1.0000 | ORAL_TABLET | Freq: Two times a day (BID) | ORAL | 1 refills | Status: DC

## 2019-09-28 MED ORDER — ADULT MULTIVITAMIN W/MINERALS CH: 1.0000 | ORAL_TABLET | Freq: Every day | ORAL | 1 refills | Status: DC

## 2019-09-28 MED ORDER — ASPIRIN 81 MG PO TBEC: 81.0000 mg | DELAYED_RELEASE_TABLET | Freq: Every day | ORAL | 1 refills | Status: DC

## 2019-09-28 MED FILL — ENTRESTO 49 MG-51 MG TABLET: 49-51 | 30 days supply | Qty: 60 | Fill #0

## 2019-09-28 MED FILL — CARVEDILOL 12.5 MG TABLET: 12.5 | 30 days supply | Qty: 60 | Fill #0

## 2019-09-28 MED FILL — NYSTATIN 100000 UNIT/ML SUS: 100000 | 5 days supply | Qty: 120 | Fill #0

## 2019-09-28 MED FILL — VITAMIN B-1 100 MG TABS: 100 | 30 days supply | Qty: 30 | Fill #0

## 2019-09-28 MED FILL — ASPIRIN LOW DOSE 81 MG TBEC: 81 | 30 days supply | Qty: 30 | Fill #0

## 2019-09-28 MED FILL — CERTAVITE/ANTIOXIDANTS TABS: 30 days supply | Qty: 30 | Fill #0

## 2019-09-28 MED FILL — PANTOPRAZOLE SOD DR 40 MG T: 40 | 30 days supply | Qty: 30 | Fill #0

## 2019-09-28 MED FILL — SPIRONOLACTONE 25 MG TABLET: 25 | 30 days supply | Qty: 15 | Fill #0

## 2019-09-28 MED FILL — FOLIC ACID 1 MG TABS: 1 | 30 days supply | Qty: 30 | Fill #0

## 2019-09-28 NOTE — Discharge Summary (Signed)
Physician Discharge Summary  Derrick Hoffman:[id number] DOB: 1976/04/21 DOA: 09/24/2019  PCP: Patient, No Pcp Per  Admit date: 09/24/2019 Discharge date: 09/28/2019  Time spent: 55 minutes  Recommendations for Outpatient Follow-up:  1. Follow-up with cardiology/[facility] cardiovascular division in the outpatient setting.  Patient will be called with follow-up appointment date and time. 2. Follow-up with  [facility] to establish primary care and hospital follow-up on 10/12/2019.  On follow-up patient will need a comprehensive metabolic profile done to follow-up on electrolytes and renal function as well as LFTs.  Patient will need a CBC done to follow-up on H&H.  Patient's blood pressure will need to be reassessed and followed up upon. 3. Follow-up with [facility] for assistance with medications.   Discharge Diagnoses:  Principal Problem:   SIRS (systemic inflammatory response syndrome) (HCC) Active Problems:   Hypertensive urgency   Tick bite   Chest pain   NICM (nonischemic cardiomyopathy) (HCC)   Elevated troponin   Alcohol abuse   Leukocytosis   Hypokalemia   Hypophosphatemia   Discharge Condition: Stable and improved  Diet recommendation: Heart healthy  Filed Weights   09/26/19 0339 09/27/19 0503 09/28/19 0537  Weight: 102.5 kg 101.7 kg 101.8 kg    History of present illness:  HPI per Dr. Corey Skains is a 44 y.o. male with history of hypertension, tobacco abuse and drinks alcohol at least 4 times a week presented to the ER because of chest pain and shortness of breath since the morning prior to admission.  Pain was persistent retrosternal nonradiating with some shortness of breath and subjective feeling of fever chills.  Denied any nausea vomiting diarrhea productive cough headache or any visual symptoms.  Patient stated he was treated for tick bite with 14 days course of doxycycline last month  which he completed.  Patient has not developed any joint swelling or skin rash since then.  No further tick bites.  He works as a Games developer.  ED Course: In the ER patient was tachycardic with temperature 100.1 F and WBC count of 13.  EKG was showing sinus tachycardia and troponins were 23 and 24.  Lactic acid 1.8.  CT angiogram of the chest was negative for PE does show mild bilateral pleural effusion.  Blood cultures obtained started on empiric antibiotics and admitted for further observation.  Patient blood pressure was marked elevated with systolic more than 811 and diastolic more than 914.  Patient states he missed his Norvasc for last 1 week.  Covid test was negative.   Hospital Course:  1 nonischemic cardiomyopathy/elevated troponin Felt likely secondary to ongoing alcohol use/polysubstance use in the setting of hypertensive urgency/poorly controlled hypertension.    Cardiology consulted and followed the patient throughout the hospitalization.  2D echo done with a EF of 25 to 30% with hypokinesis of the left ventricular/anteroseptal akinesis.  Cardiac MRI which was done was confirmatory for nonischemic cardiomyopathy with no signs of infiltration, scar or evidence of amyloid noted.  Patient underwent catheterization with clean coronaries noted.  Patient was maintained on aspirin, Coreg, Entresto, Aldactone per cardiology recommendations.  Alcohol cessation stressed to patient.  Patient will be discharged in stable and improved condition and will follow up with cardiology in the outpatient setting.  Patient will be called with an appointment time.   2.  Alcohol abuse Alcohol cessation stressed to patient.  Per RN the morning of 09/27/2019 there was some concern for alcohol withdrawal as  patient noted to be diaphoretic, headache and nausea which improved after patient was placed back on the Ativan withdrawal protocol.  Patient was also maintained on thiamine, folic acid and multivitamin.  Patient  improved clinically did not have any further withdrawal symptoms and did not require any further IV Ativan.  Patient will be discharged home in stable and improved condition.    3.  Hypertensive urgency Likely secondary to medication noncompliance.  Blood pressure improved on  regimen of Coreg, Entresto, Aldactone which patient was started on per cardiology.  Outpatient follow-up with PCP and cardiology.  4.?  Chronic leukocytosis Patient noted to have a leukocytosis dating back to January 2021.  Patient noted to have recently been prescribed doxycycline for concern for [facility] spotted fever and per prior MD noted to have only finished about half the dosing.  Per patient's wife patient did not complete full course however took 8 days worth of antibiotic treatment.  Titers for [facility] spotted fever was negative.  Ehrlichia was negative.  Titers for Lyme disease showed a negative IgG IgM Western blot was positive.  Patient however did not have any erythema migrans, no neurological deficits, no joint pain.  Results of Lyme IgM Western blot discussed with infectious disease who felt it was highly unlikely that patient had Lyme disease patient had no clinical symptoms and recommended no further treatment at this time.  Patient initially was started on doxycycline which has subsequently been discontinued.  Leukocytosis improved.  Outpatient follow-up..   5.  Hypokalemia/hypophosphatemia Repleted.  6.  Transaminitis Questionable etiology.  CT angio chest done consistent with hepatic steatosis.  Patient with no abdominal pain.  Acute hepatitis panel negative.  Supportive care.  Alcohol cessation stressed to patient.  Outpatient follow-up.  7.  Hyperlipidemia LDL of 222.  Patient with a transaminitis and hepatic steatosis.  Patient was not started on a statin due to transaminitis.  Will need repeat LFTs done on follow-up and if resolution of transaminitis patient will need to be started on a  statin.  Outpatient follow-up with PCP.   Procedures:  2D echo 09/25/2019  Cardiac catheterization 09/25/2019  CT angiogram chest 09/24/2019  Chest x-ray 09/24/2019  Cardiac MRI 09/26/2019   Consultations:  Cardiology: Dr. Eden Emms 09/25/2019  Discharge Exam: Vitals:   09/28/19 0811 09/28/19 0857  BP: (!) 144/99   Pulse: 90 92  Resp: 16   Temp: 98.3 F (36.8 C)   SpO2: 97%     General: NAD Cardiovascular: RRR Respiratory: CTAB  Discharge Instructions   Discharge Instructions    Diet - low sodium heart healthy   Complete by: As directed    Increase activity slowly   Complete by: As directed      Allergies as of 09/28/2019      Reactions   Penicillins Swelling   Did it involve swelling of the face/tongue/throat, SOB, or low BP? Y Did it involve sudden or severe rash/hives, skin peeling, or any reaction on the inside of your mouth or nose? N Did you need to seek medical attention at a hospital or doctor's office? Y When did it last happen? 10 years If all above answers are "NO", may proceed with cephalosporin use.      Medication List    STOP taking these medications   amLODipine 5 MG tablet Commonly known as: NORVASC   doxycycline 100 MG capsule Commonly known as: VIBRAMYCIN   naproxen 500 MG tablet Commonly known as: NAPROSYN   omeprazole 40 MG capsule  Commonly known as: PRILOSEC   ondansetron 4 MG disintegrating tablet Commonly known as: Zofran ODT   oxyCODONE 5 MG immediate release tablet Commonly known as: Oxy IR/ROXICODONE   oxyCODONE-acetaminophen 5-325 MG tablet Commonly known as: PERCOCET/ROXICET   potassium chloride 10 MEQ tablet Commonly known as: KLOR-CON   sucralfate 1 g tablet Commonly known as: Carafate     TAKE these medications   aspirin 81 MG EC tablet Take 1 tablet (81 mg total) by mouth daily. Start taking on: Sep 29, 2019   carvedilol 12.5 MG tablet Commonly known as: COREG Take 1 tablet (12.5 mg total) by mouth 2  (two) times daily with a meal.   folic acid 1 MG tablet Commonly known as: FOLVITE Take 1 tablet (1 mg total) by mouth daily. Start taking on: Sep 29, 2019   multivitamin with minerals Tabs tablet Take 1 tablet by mouth daily. Start taking on: Sep 29, 2019   nystatin 100000 UNIT/ML suspension Commonly known as: MYCOSTATIN Take 5 mLs (500,000 Units total) by mouth 4 (four) times daily for 5 days.   pantoprazole 40 MG tablet Commonly known as: Protonix Take 1 tablet (40 mg total) by mouth daily.   sacubitril-valsartan 49-51 MG Commonly known as: ENTRESTO Take 1 tablet by mouth 2 (two) times daily.   spironolactone 25 MG tablet Commonly known as: ALDACTONE Take 0.5 tablets (12.5 mg total) by mouth daily. Start taking on: Sep 29, 2019   thiamine 100 MG tablet Take 1 tablet (100 mg total) by mouth daily. Start taking on: Sep 29, 2019      Allergies  Allergen Reactions  . Penicillins Swelling    Did it involve swelling of the face/tongue/throat, SOB, or low BP? Y Did it involve sudden or severe rash/hives, skin peeling, or any reaction on the inside of your mouth or nose? N Did you need to seek medical attention at a hospital or doctor's office? Y When did it last happen? 10 years If all above answers are "NO", may proceed with cephalosporin use.   Follow-up Information    [facility] Follow up on 10/12/2019.   Why: @ 2:30 with Gwinda Passe for hospital follow up appointment- If you cannot make this scheduled appointment please call to reschedule.  Contact information: [place] [employer] [place] [address] [phone]       [facility]. Go to.   Why: this location for assistance with medications- cost ranges from $4.00-$10.00 Contact information: [address] [place] [address] [phone]       [facility] Follow up.   Why: Office will call with outpatient follow-up appointment date and time. Contact information: [address] [place] [address] [phone]           The results of significant diagnostics from this hospitalization (including imaging, microbiology, ancillary and laboratory) are listed below for reference.    Significant Diagnostic Studies: DG Chest 2 View  Result Date: 09/24/2019 CLINICAL DATA:  Chest pain. EXAM: CHEST - 2 VIEW COMPARISON:  October 20, 2018 FINDINGS: The heart, hila, and mediastinum are normal. No pneumothorax. No nodules or masses. No focal infiltrates. IMPRESSION: No active cardiopulmonary disease. Electronically Signed   By: Gerome Sam III M.D   On: 09/24/2019 19:28   CT Angio Chest PE W and/or Wo Contrast  Result Date: 09/24/2019 CLINICAL DATA:  [name] centralized chest pain with shortness of breath.  EXAM: CT ANGIOGRAPHY CHEST WITH CONTRAST TECHNIQUE: Multidetector CT imaging of the chest was performed using the standard protocol during bolus administration of intravenous contrast. Multiplanar CT image reconstructions and MIPs were obtained to evaluate the vascular anatomy. CONTRAST:  OMNIPAQUE IOHEXOL 350 MG/ML SOLN COMPARISON:  None. FINDINGS: Cardiovascular: Contrast injection is sufficient to demonstrate satisfactory opacification of the pulmonary arteries to the segmental level. There is no pulmonary embolus. The main pulmonary artery is within normal limits for size. There is no CT evidence of acute right heart strain. The visualized aorta is normal. Heart size is normal, without pericardial effusion. Mediastinum/Nodes: --No mediastinal or hilar lymphadenopathy. --No axillary lymphadenopathy. --No supraclavicular lymphadenopathy. --Normal thyroid gland. --The esophagus is unremarkable Lungs/Pleura: There are trace bilateral pleural effusions. There is no pneumothorax. No focal infiltrate. The trachea is  unremarkable. Upper Abdomen: There appears to be hepatic steatosis. Otherwise, the visualized portions of the upper abdomen are unremarkable. Musculoskeletal: No chest wall abnormality. No acute or significant osseous findings. Review of the MIP images confirms the above findings. IMPRESSION: 1. No evidence of pulmonary embolus. 2. Trace bilateral pleural effusions. 3. Hepatic steatosis. Electronically Signed   By: Katherine Mantle M.D.   On: 09/24/2019 23:54   CARDIAC CATHETERIZATION  Result Date: 09/25/2019 SADIQ TENNYSON is a 43 y.o. male  [id number] LOCATION:  FACILITY: [facility] PHYSICIAN: Nanetta Batty, M.D. [birth year] DATE OF PROCEDURE:  09/25/2019 DATE OF DISCHARGE: CARDIAC CATHETERIZATION History obtained from chart review.GOREE BOVARD is a 44 y.o. male with a hx of HTN, tobacco abuse, habitual ETOH abuse, abnormal LFTs, diffuse hepatic steatosis by imaging, aortic atherosclerosis by CT 05/2019, hypokalemia who is being seen today for the evaluation of chest pain, abnormal EKG and elevated troponin  his EKG showed inferior and anterolateral T wave inversion.  His 2D echo revealed severe LV dysfunction with an EF in the 25% range.   Mr. Crawmer has clean coronary arteries, moderate elevation of LVEDP and severe LV dysfunction consistent with a nonischemic cardiomyopathy potentially related to hypertensive heart disease and/or excessive alcohol intake.  Guideline directed optimal medical therapy will be recommended including carvedilol, Entresto, spironolactone as he has already been recommended in the chart.  Cardiac risk factor modification including smoking cessation and avoidance of illicit drugs including alcohol.  The sheath was removed and a TR band was placed on the right wrist to achieve patent hemostasis.  The patient left lab in stable condition.  I am concerned that medical compliance will be an issue. Nanetta Batty. MD, [facility] 09/25/2019 3:13 PM   MR CARDIAC MORPHOLOGY W WO CONTRAST  Result Date:  09/26/2019 CLINICAL DATA:  Cardiomyopathy EXAM: CARDIAC MRI TECHNIQUE: The patient was scanned on a 1.5 Tesla Siemens magnet. A dedicated cardiac coil was used. Functional imaging was done using Fiesta sequences. 2,3, and 4 chamber views were done to assess for RWMA's. Modified Simpson's rule using a short axis stack was used to calculate an ejection fraction on a dedicated work [employer], [employer]. The patient received 10 cc of Gadavist. After 10 minutes inversion recovery sequences were used to assess for infiltration and scar tissue. CONTRAST:  Gadavist FINDINGS: Mild LAE Normal RA/RV. No pericardial effusion. No ASD/PFO. Normal MV, AV and TV. Severe LVE with diffuse hypokinesis No defined RWMA;s. Quantitative EF 20% (EDV 221 cc ESV 176 cc SV 45 cc) No LAA thrombus. Delayed gadolinium images normal with no evidence of infarct, infiltration or myocarditis IMPRESSION: 1. Severe LVE with diffuse hypokinesis EF 20% 2. No delayed  gadolinium uptake on inversion recovery sequences. Findings consistent with non ischemic cardiomyopathy 3.  Mild LAE 4.  Normla RV size and function Charlton Haws Electronically Signed   By: Charlton Haws M.D.   On: 09/26/2019 12:57   ECHOCARDIOGRAM COMPLETE  Result Date: 09/25/2019    ECHOCARDIOGRAM REPORT   Patient Name:   ALEXX MCBURNEY Hoyt Date of Exam: 09/25/2019 Medical Rec #:  [id number]     Height:       74.0 in Accession #:    [id number]    Weight:       230.0 lb Date of Birth:  1976/04/30     BSA:          2.306 m Patient Age:    44 years      BP:           161/102 mmHg Patient Gender: M             HR:           95 bpm. Exam Location:  Inpatient Procedure: 2D Echo, Cardiac Doppler and Color Doppler                     STAT ECHO Reported to: Dr. Elease Hashimoto on 09/25/2019 10:26:00 AM. Indications:    Acute coronary syndrome I24.9  History:        Patient has no prior history of Echocardiogram examinations.                 Risk Factors:Hypertension.  Sonographer:    Elmarie Shiley Dance  Referring Phys: [id number] SAMTANI IMPRESSIONS  1. Left ventricular ejection fraction, by estimation, is 25 to 30%. The left ventricle has severely decreased function. The left ventricle demonstrates global hypokinesis. The left ventricular internal cavity size was moderately dilated. Left ventricular diastolic parameters are consistent with Grade II diastolic dysfunction (pseudonormalization). Elevated left ventricular end-diastolic pressure. There is severe hypokinesis of the left ventricular, entire anteroseptal wall. There is severe akinesis of the left ventricular, mid anteroseptal wall.  2. Right ventricular systolic function is mildly reduced. The right ventricular size is normal.  3. The mitral valve is grossly normal. Mild mitral valve regurgitation. No evidence of mitral stenosis.  4. The aortic valve is normal in structure. Aortic valve regurgitation is mild. No aortic stenosis is present. FINDINGS  Left Ventricle: Left ventricular ejection fraction, by estimation, is 25 to 30%. The left ventricle has severely decreased function. The left ventricle demonstrates global hypokinesis. Severe hypokinesis of the left ventricular, entire anteroseptal wall. Severe akinesis of the left ventricular, mid anteroseptal wall. The left ventricular internal cavity size was moderately dilated. There is no left ventricular hypertrophy. Left ventricular diastolic parameters are consistent with Grade II diastolic  dysfunction (pseudonormalization). Elevated left ventricular end-diastolic pressure. Right Ventricle: The right ventricular size is normal. No increase in right ventricular wall thickness. Right ventricular systolic function is mildly reduced. Left Atrium: Left atrial size was normal in size. Right Atrium: Right atrial size was normal in size. Pericardium: There is no evidence of pericardial effusion. Mitral Valve: The mitral valve is grossly normal. Mild mitral valve regurgitation. No evidence of mitral  valve stenosis. Tricuspid Valve: The tricuspid valve is normal in structure. Tricuspid valve regurgitation is mild . No evidence of tricuspid stenosis. Aortic Valve: The aortic valve is normal in structure. Aortic valve regurgitation is mild. Aortic regurgitation PHT measures 358 msec. No aortic stenosis is present. Pulmonic Valve: The pulmonic valve was grossly normal. Pulmonic valve regurgitation is  trivial. No evidence of pulmonic stenosis. Aorta: The aortic root and ascending aorta are structurally normal, with no evidence of dilitation. IAS/Shunts: The atrial septum is grossly normal.  LEFT VENTRICLE PLAX 2D LVIDd:         6.12 cm  Diastology LVIDs:         5.16 cm  LV e' lateral:   3.73 cm/s LV PW:         1.07 cm  LV E/e' lateral: 19.1 LV IVS:        0.95 cm  LV e' medial:    3.49 cm/s LVOT diam:     2.50 cm  LV E/e' medial:  20.4 LV SV:         75 LV SV Index:   33 LVOT Area:     4.91 cm  RIGHT VENTRICLE             IVC RV Basal diam:  2.78 cm     IVC diam: 1.56 cm RV S prime:     11.20 cm/s TAPSE (M-mode): 1.5 cm LEFT ATRIUM             Index       RIGHT ATRIUM           Index LA diam:        4.80 cm 2.08 cm/m  RA Area:     12.80 cm LA Vol (A2C):   91.0 ml 39.45 ml/m RA Volume:   29.60 ml  12.83 ml/m LA Vol (A4C):   58.5 ml 25.36 ml/m LA Biplane Vol: 73.9 ml 32.04 ml/m  AORTIC VALVE LVOT Vmax:   87.20 cm/s LVOT Vmean:  57.000 cm/s LVOT VTI:    0.153 m AI PHT:      358 msec  AORTA Ao Root diam: 3.80 cm Ao Asc diam:  3.60 cm MITRAL VALVE MV Area (PHT): 4.21 cm    SHUNTS MV Decel Time: 180 msec    Systemic VTI:  0.15 m MV E velocity: 71.30 cm/s  Systemic Diam: 2.50 cm MV A velocity: 56.30 cm/s MV E/A ratio:  1.27 Kristeen Miss MD Electronically signed by Kristeen Miss MD Signature Date/Time: 09/25/2019/11:36:37 AM    Final     Microbiology: Recent Results (from the past 240 hour(s))  SARS Coronavirus 2 by RT PCR (hospital order, performed in [facility] hospital lab) Nasopharyngeal Nasopharyngeal  Swab     Status: None   Collection Time: 09/24/19  9:30 PM   Specimen: Nasopharyngeal Swab  Result Value Ref Range Status   SARS Coronavirus 2 NEGATIVE NEGATIVE Final    Comment: (NOTE) SARS-CoV-2 target nucleic acids are NOT DETECTED. The SARS-CoV-2 RNA is generally detectable in upper and lower respiratory specimens during the acute phase of infection. The lowest concentration of SARS-CoV-2 viral copies this assay can detect is 250 copies / mL. A negative result does not preclude SARS-CoV-2 infection and should not be used as the sole basis for treatment or other patient management decisions.  A negative result may occur with improper specimen collection / handling, submission of specimen other than nasopharyngeal swab, presence of viral mutation(s) within the areas targeted by this assay, and inadequate number of viral copies (<250 copies / mL). A negative result must be combined with clinical observations, patient history, and epidemiological information. Fact Sheet for Patients:   BoilerBrush.com.cy Fact Sheet for Healthcare Providers: https://pope.com/ This test is not yet approved or cleared  by the [place] FDA and has been authorized for detection and/or  diagnosis of SARS-CoV-2 by FDA under an Emergency Use Authorization (EUA).  This EUA will remain in effect (meaning this test can be used) for the duration of the COVID-19 declaration under Section 564(b)(1) of the Act, 21 U.S.C. section 360bbb-3(b)(1), unless the authorization is terminated or revoked sooner. Performed at [facility], [address], [place], [place] [address]   Blood culture (routine x 2)     Status: None (Preliminary result)   Collection Time: 09/24/19 10:21 PM   Specimen: BLOOD LEFT HAND  Result Value Ref Range Status   Specimen Description BLOOD LEFT HAND  Final   Special Requests   Final    BOTTLES DRAWN AEROBIC AND ANAEROBIC Blood Culture  adequate volume   Culture   Final    NO GROWTH 4 DAYS Performed at [facility], [address] [place], [place] [address]    Report Status PENDING  Incomplete  Blood culture (routine x 2)     Status: Abnormal   Collection Time: 09/24/19 10:34 PM   Specimen: BLOOD  Result Value Ref Range Status   Specimen Description BLOOD RIGHT ANTECUBITAL  Final   Special Requests   Final    BOTTLES DRAWN AEROBIC AND ANAEROBIC Blood Culture adequate volume   Culture  Setup Time   Final    GRAM POSITIVE COCCI IN CLUSTERS AEROBIC BOTTLE ONLY Organism ID to follow CRITICAL RESULT CALLED TO, READ BACK BY AND VERIFIED WITH: Kelby Fam PharmD 15:55 09/26/19 ([email or web address])    Culture (A)  Final    STAPHYLOCOCCUS SPECIES (COAGULASE NEGATIVE) THE SIGNIFICANCE OF ISOLATING THIS ORGANISM FROM A SINGLE SET OF BLOOD CULTURES WHEN MULTIPLE SETS ARE DRAWN IS UNCERTAIN. PLEASE NOTIFY THE MICROBIOLOGY DEPARTMENT WITHIN ONE WEEK IF SPECIATION AND SENSITIVITIES ARE REQUIRED. Performed at [facility], [address], [place], [place] [address]    Report Status 09/28/2019 FINAL  Final  Blood Culture ID Panel (Reflexed)     Status: Abnormal   Collection Time: 09/24/19 10:34 PM  Result Value Ref Range Status   Enterococcus species NOT DETECTED NOT DETECTED Final   Listeria monocytogenes NOT DETECTED NOT DETECTED Final   Staphylococcus species DETECTED (A) NOT DETECTED Final    Comment: Methicillin (oxacillin) susceptible coagulase negative staphylococcus. Possible blood culture contaminant (unless isolated from more than one blood culture draw or clinical case suggests pathogenicity). No antibiotic treatment is indicated for blood  culture contaminants. CRITICAL RESULT CALLED TO, READ BACK BY AND VERIFIED WITH: Kelby Fam PharmD 15:55 09/26/19 ([email or web address])    Staphylococcus aureus (BCID) NOT DETECTED NOT DETECTED Final   Methicillin resistance NOT DETECTED NOT DETECTED Final   Streptococcus species NOT DETECTED  NOT DETECTED Final   Streptococcus agalactiae NOT DETECTED NOT DETECTED Final   Streptococcus pneumoniae NOT DETECTED NOT DETECTED Final   Streptococcus pyogenes NOT DETECTED NOT DETECTED Final   Acinetobacter baumannii NOT DETECTED NOT DETECTED Final   Enterobacteriaceae species NOT DETECTED NOT DETECTED Final   Enterobacter cloacae complex NOT DETECTED NOT DETECTED Final   Escherichia coli NOT DETECTED NOT DETECTED Final   Klebsiella oxytoca NOT DETECTED NOT DETECTED Final   Klebsiella pneumoniae NOT DETECTED NOT DETECTED Final   Proteus species NOT DETECTED NOT DETECTED Final   Serratia marcescens NOT DETECTED NOT DETECTED Final   Haemophilus influenzae NOT DETECTED NOT DETECTED Final   Neisseria meningitidis NOT DETECTED NOT DETECTED Final   Pseudomonas aeruginosa NOT DETECTED NOT DETECTED Final   Candida albicans NOT DETECTED NOT DETECTED Final  Candida glabrata NOT DETECTED NOT DETECTED Final   Candida krusei NOT DETECTED NOT DETECTED Final   Candida parapsilosis NOT DETECTED NOT DETECTED Final   Candida tropicalis NOT DETECTED NOT DETECTED Final    Comment: Performed at [facility], [address], [place], [place] [address]     Labs: Basic Metabolic Panel: Recent Labs  Lab 09/25/19 0715 09/25/19 1303 09/26/19 0416 09/27/19 0452 09/27/19 0841 09/28/19 0327  NA 139 137 136  --  135 135  K 2.9* 3.1* 3.6  --  3.3* 3.7  CL 100 100 102  --  101 102  CO2 24 25 21*  --  22 20*  GLUCOSE 107* 112* 99  --  99 100*  BUN --  12 13  CREATININE 1.02 0.91 0.89  --  0.85 1.03  CALCIUM 8.4* 8.6* 8.6*  --  8.7* 9.0  MG  --  1.4* 2.0 1.9  --  1.9  PHOS  --   --   --   --  2.2*  --    Liver Function Tests: Recent Labs  Lab 09/25/19 0715 09/27/19 0841 09/28/19 0327  AST 147* 112* 209*  ALT 158* 125* 196*  ALKPHOS 98 99 91  BILITOT 2.6* 2.1* 1.7*  PROT 7.2 7.1 7.4  ALBUMIN 3.5 3.7 3.8   Recent Labs  Lab 09/25/19 0715  LIPASE 37   No results for input(s):  AMMONIA in the last 168 hours. CBC: Recent Labs  Lab 09/24/19 1912 09/25/19 0715 09/26/19 0416 09/27/19 0452 09/28/19 0327  WBC 13.9* 12.7* 13.9* 10.5 10.9*  NEUTROABS  --  9.0*  --  7.2 6.9  HGB 17.0 15.8 16.7 17.0 16.9  HCT 50.4 47.2 49.3 50.1 49.7  MCV 97.9 97.9 97.2 98.0 98.0  PLT 291 247 248 267 286   Cardiac Enzymes: Recent Labs  Lab 09/25/19 0715  CKTOTAL 267   BNP: BNP (last 3 results) No results for input(s): BNP in the last 8760 hours.  ProBNP (last 3 results) No results for input(s): PROBNP in the last 8760 hours.  CBG: No results for input(s): GLUCAP in the last 168 hours.     Signed:  Ramiro Harvest MD.  [facility] 09/28/2019, 2:09 PM

## 2019-09-29 ENCOUNTER — Telehealth: Payer: Self-pay | Admitting: Cardiovascular Disease

## 2019-09-29 LAB — CULTURE, BLOOD (ROUTINE X 2)
Culture: NO GROWTH
Special Requests: ADEQUATE

## 2019-09-29 NOTE — Telephone Encounter (Signed)
-----   Message from Wendall Stade, MD sent at 09/27/2019  9:39 AM EDT ----- Needs TOC post hospital d/c f/u in CHF clinic newly diagnosed Non ischemic DCM Titrate meds Needs BMET in 2 weeks

## 2019-09-29 NOTE — Telephone Encounter (Signed)
Attempted to contact pt to set up TOC and BMET. Unable to leave a voicemail d/t mailbox not being set up

## 2019-10-11 ENCOUNTER — Ambulatory Visit (INDEPENDENT_AMBULATORY_CARE_PROVIDER_SITE_OTHER): Payer: Self-pay | Admitting: Cardiovascular Disease

## 2019-10-11 ENCOUNTER — Other Ambulatory Visit: Payer: Self-pay

## 2019-10-11 ENCOUNTER — Encounter: Payer: Self-pay | Admitting: Cardiovascular Disease

## 2019-10-11 DIAGNOSIS — E782 Mixed hyperlipidemia: Secondary | ICD-10-CM

## 2019-10-11 DIAGNOSIS — E785 Hyperlipidemia, unspecified: Secondary | ICD-10-CM | POA: Insufficient documentation

## 2019-10-11 DIAGNOSIS — I428 Other cardiomyopathies: Secondary | ICD-10-CM

## 2019-10-11 DIAGNOSIS — F1010 Alcohol abuse, uncomplicated: Secondary | ICD-10-CM

## 2019-10-11 MED ORDER — CARVEDILOL 25 MG PO TABS: 25.0000 mg | ORAL_TABLET | Freq: Two times a day (BID) | ORAL | 1 refills | Status: DC

## 2019-10-11 MED ORDER — ATORVASTATIN CALCIUM 20 MG PO TABS
20.0000 mg | ORAL_TABLET | Freq: Every day | ORAL | 3 refills | Status: DC
Start: 2019-10-11 — End: 2020-03-05

## 2019-10-11 NOTE — Assessment & Plan Note (Signed)
Alcohol intake is decreased from a pint on multiple nights a week to several shots.  We talked about the importance of complete cessation.

## 2019-10-11 NOTE — Patient Instructions (Addendum)
Medication Instructions:  Start Atorvastatin 20 mg daily  Increase Carvedilol to 25 twice daily   *If you need a refill on your cardiac medications before your next appointment, please call your pharmacy*   Lab Work: LIPID/LIVER in 2 months   If you have labs (blood work) drawn today and your tests are completely normal, you will receive your results only by: Marland Kitchen MyChart Message (if you have MyChart) OR . A paper copy in the mail If you have any lab test that is abnormal or we need to change your treatment, we will call you to review the results.   Testing/Procedures: Echocardiogram (3 months) - Your physician has requested that you have an echocardiogram. Echocardiography is a painless test that uses sound waves to create images of your heart. It provides your doctor with information about the size and shape of your heart and how well your heart's chambers and valves are working. This procedure takes approximately one hour. There are no restrictions for this procedure. This will be performed at our [facility] location - [address], [address].    Follow-Up: At [facility], you and your health needs are our priority.  As part of our continuing mission to provide you with exceptional heart care, we have created designated Provider Care Teams.  These Care Teams include your primary Cardiologist (physician) and Advanced Practice Providers (APPs -  Physician Assistants and Nurse Practitioners) who all work together to provide you with the care you need, when you need it.  We recommend signing up for the patient portal called "MyChart".  Sign up information is provided on this After Visit Summary.  MyChart is used to connect with patients for Virtual Visits (Telemedicine).  Patients are able to view lab/test results, encounter notes, upcoming appointments, etc.  Non-urgent messages can be sent to your provider as well.   To learn more about what you can do with MyChart, go to  [email or web address].    Your next appointment:   3 month(s)  The format for your next appointment:   In Person  Provider:   Quay Burow, MD   Other Instructions Purchase an [place] BP Cuff-  Monitor BP log for 30 days.  See PharmD in 1 month.      Low-Sodium Eating Plan Sodium, which is an element that makes up salt, helps you maintain a healthy balance of fluids in your body. Too much sodium can increase your blood pressure and cause fluid and waste to be held in your body. Your health care provider or dietitian may recommend following this plan if you have high blood pressure (hypertension), kidney disease, liver disease, or heart failure. Eating less sodium can help lower your blood pressure, reduce swelling, and protect your heart, liver, and kidneys. What are tips for following this plan? General guidelines  Most people on this plan should limit their sodium intake to 1,500-2,000 mg (milligrams) of sodium each day. Reading food labels   The Nutrition Facts label lists the amount of sodium in one serving of the food. If you eat more than one serving, you must multiply the listed amount of sodium by the number of servings.  Choose foods with less than 140 mg of sodium per serving.  Avoid foods with 300 mg of sodium or more per serving. Shopping  Look for lower-sodium products, often labeled as "low-sodium" or "no salt added."  Always check the sodium content even if foods are labeled as "unsalted" or "no salt added".  Buy  fresh foods. ? Avoid canned foods and premade or frozen meals. ? Avoid canned, cured, or processed meats  Buy breads that have less than 80 mg of sodium per slice. Cooking  Eat more home-cooked food and less restaurant, buffet, and fast food.  Avoid adding salt when cooking. Use salt-free seasonings or herbs instead of table salt or sea salt. Check with your health care provider or pharmacist before using salt substitutes.  Cook with  plant-based oils, such as canola, sunflower, or olive oil. Meal planning  When eating at a restaurant, ask that your food be prepared with less salt or no salt, if possible.  Avoid foods that contain MSG (monosodium glutamate). MSG is sometimes added to [place] food, bouillon, and some canned foods. What foods are recommended? The items listed may not be a complete list. Talk with your dietitian about what dietary choices are best for you. Grains Low-sodium cereals, including oats, puffed wheat and rice, and shredded wheat. Low-sodium crackers. Unsalted rice. Unsalted pasta. Low-sodium bread. Whole-grain breads and whole-grain pasta. Vegetables Fresh or frozen vegetables. "No salt added" canned vegetables. "No salt added" tomato sauce and paste. Low-sodium or reduced-sodium tomato and vegetable juice. Fruits Fresh, frozen, or canned fruit. Fruit juice. Meats and other protein foods Fresh or frozen (no salt added) meat, poultry, seafood, and fish. Low-sodium canned tuna and salmon. Unsalted nuts. Dried peas, beans, and lentils without added salt. Unsalted canned beans. Eggs. Unsalted nut butters. Dairy Milk. Soy milk. Cheese that is naturally low in sodium, such as ricotta cheese, fresh mozzarella, or Swiss cheese Low-sodium or reduced-sodium cheese. Cream cheese. Yogurt. Fats and oils Unsalted butter. Unsalted margarine with no trans fat. Vegetable oils such as canola or olive oils. Seasonings and other foods Fresh and dried herbs and spices. Salt-free seasonings. Low-sodium mustard and ketchup. Sodium-free salad dressing. Sodium-free light mayonnaise. Fresh or refrigerated horseradish. Lemon juice. Vinegar. Homemade, reduced-sodium, or low-sodium soups. Unsalted popcorn and pretzels. Low-salt or salt-free chips. What foods are not recommended? The items listed may not be a complete list. Talk with your dietitian about what dietary choices are best for you. Grains Instant hot cereals. Bread  stuffing, pancake, and biscuit mixes. Croutons. Seasoned rice or pasta mixes. Noodle soup cups. Boxed or frozen macaroni and cheese. Regular salted crackers. Self-rising flour. Vegetables Sauerkraut, pickled vegetables, and relishes. Olives. [place] fries. Onion rings. Regular canned vegetables (not low-sodium or reduced-sodium). Regular canned tomato sauce and paste (not low-sodium or reduced-sodium). Regular tomato and vegetable juice (not low-sodium or reduced-sodium). Frozen vegetables in sauces. Meats and other protein foods Meat or fish that is salted, canned, smoked, spiced, or pickled. Bacon, ham, sausage, hotdogs, corned beef, chipped beef, packaged lunch meats, salt pork, jerky, pickled herring, anchovies, regular canned tuna, sardines, salted nuts. Dairy Processed cheese and cheese spreads. Cheese curds. Blue cheese. Feta cheese. String cheese. Regular cottage cheese. Buttermilk. Canned milk. Fats and oils Salted butter. Regular margarine. Ghee. Bacon fat. Seasonings and other foods Onion salt, garlic salt, seasoned salt, table salt, and sea salt. Canned and packaged gravies. Worcestershire sauce. Tartar sauce. Barbecue sauce. Teriyaki sauce. Soy sauce, including reduced-sodium. Steak sauce. Fish sauce. Oyster sauce. Cocktail sauce. Horseradish that you find on the shelf. Regular ketchup and mustard. Meat flavorings and tenderizers. Bouillon cubes. Hot sauce and Tabasco sauce. Premade or packaged marinades. Premade or packaged taco seasonings. Relishes. Regular salad dressings. Salsa. Potato and tortilla chips. Corn chips and puffs. Salted popcorn and pretzels. Canned or dried soups. Pizza. Frozen entrees and pot  pies. Summary  Eating less sodium can help lower your blood pressure, reduce swelling, and protect your heart, liver, and kidneys.  Most people on this plan should limit their sodium intake to 1,500-2,000 mg (milligrams) of sodium each day.  Canned, boxed, and frozen foods are  high in sodium. Restaurant foods, fast foods, and pizza are also very high in sodium. You also get sodium by adding salt to food.  Try to cook at home, eat more fresh fruits and vegetables, and eat less fast food, canned, processed, or prepared foods. This information is not intended to replace advice given to you by your health care provider. Make sure you discuss any questions you have with your health care provider. Document Revised: 04/02/2017 Document Reviewed: 04/13/2016 Elsevier Patient Education  2020 [employer].

## 2019-10-11 NOTE — Assessment & Plan Note (Signed)
Recent mission for hypertensive urgency and chest pain with a 2D echo that showed an EF of 25 to 30% and a cardiac cath performed by myself 09/25/2019 revealing normal coronary arteries.  I suspect his decrease in LV function is multifactorial probably related to excessive alcohol intake and hypertensive heart disease.  We have talked about the importance of smoking and alcohol cessation, medication compliance.  I am going to increase his carvedilol from 12.5 to 25 mg twice daily.  He is already on Entresto 49/51.  I will have him see a Pharm.D. back in 4 weeks to review blood pressure readings and titrate his Entresto.  We will get a 2D echo in 3 months.  If his EF does not improve I will refer him to EP for consideration of ICD implantation for primary prevention of sudden cardiac death.

## 2019-10-11 NOTE — Assessment & Plan Note (Signed)
History of hyperlipidemia with lipid profile performed 09/26/2019 revealing total cholesterol 287, LDL 222 and HDL of 33.  We talked about heart healthy diet.  I am starting him on atorvastatin 20 mg a day and we will recheck a lipid liver profile in 2 months.

## 2019-10-11 NOTE — Progress Notes (Signed)
10/11/2019 [place]   December 07, 1975  [id number]  Primary Physician Patient, No Pcp Per Primary Cardiologist: Lorretta Harp MD Lupe Carney, [place]  HPI:  Derrick Hoffman is a 44 y.o. thin appearing married African-American male father of 2 sons who is accompanied by his wife Levada Dy today.  I met him during his recent hospitalization for hypertensive urgency, non-STEMI and systolic heart failure.  A 2D echo performed during his hospitalization revealed EF of 25 to 30% with global hypokinesia.  Heart catheterization performed by myself revealed normal coronary arteries.  He does smoke 1/2 pack/day down from 1 pack/day.  He began smoking at age 55.  He also drinks a pint of alcohol at least 4 nights a week.  He was discharged home on his fourth day of hospitalization on carvedilol,, spironolactone, and Entresto.  He is aware of salt restriction.   Current Meds  Medication Sig  . aspirin EC 81 MG EC tablet Take 1 tablet (81 mg total) by mouth daily.  . carvedilol (COREG) 25 MG tablet Take 1 tablet (25 mg total) by mouth 2 (two) times daily with a meal.  . folic acid (FOLVITE) 1 MG tablet Take 1 tablet (1 mg total) by mouth daily.  . Multiple Vitamin (MULTIVITAMIN WITH MINERALS) TABS tablet Take 1 tablet by mouth daily.  . pantoprazole (PROTONIX) 40 MG tablet Take 1 tablet (40 mg total) by mouth daily.  . sacubitril-valsartan (ENTRESTO) 49-51 MG Take 1 tablet by mouth 2 (two) times daily.  Marland Kitchen spironolactone (ALDACTONE) 25 MG tablet Take 0.5 tablets (12.5 mg total) by mouth daily.  Marland Kitchen thiamine 100 MG tablet Take 1 tablet (100 mg total) by mouth daily.  . [DISCONTINUED] carvedilol (COREG) 12.5 MG tablet Take 1 tablet (12.5 mg total) by mouth 2 (two) times daily with a meal.     Allergies  Allergen Reactions  . Penicillins Swelling    Did it involve swelling of the face/tongue/throat, SOB, or low BP? Y Did it involve sudden or severe rash/hives, skin peeling, or any reaction on the inside  of your mouth or nose? N Did you need to seek medical attention at a hospital or doctor's office? Y When did it last happen? 10 years If all above answers are "NO", may proceed with cephalosporin use.    Social History   Socioeconomic History  . Marital status: Married    Spouse name: Not on file  . Number of children: Not on file  . Years of education: Not on file  . Highest education level: Not on file  Occupational History  . Not on file  Tobacco Use  . Smoking status: Current Every Day Smoker    Packs/day: 0.50    Years: 1.00    Pack years: 0.50  . Smokeless tobacco: Never Used  Substance and Sexual Activity  . Alcohol use: Yes    Alcohol/week: 48.0 standard drinks    Types: 48 Shots of liquor per week    Comment: 01/05/2018 " 5th of liquor/day;  3 days/wk  . Drug use: Not Currently  . Sexual activity: Not Currently  Other Topics Concern  . Not on file  Social History Narrative  . Not on file   Social Determinants of Health   Financial Resource Strain:   . Difficulty of Paying Living Expenses:   Food Insecurity:   . Worried About Charity fundraiser in the Last Year:   . [place] in the Last Year:  Transportation Needs:   . Film/video editor (Medical):   Marland Kitchen Lack of Transportation (Non-Medical):   Physical Activity:   . Days of Exercise per Week:   . Minutes of Exercise per Session:   Stress:   . Feeling of Stress :   Social Connections:   . Frequency of Communication with Friends and Family:   . Frequency of Social Gatherings with Friends and Family:   . Attends Religious Services:   . Active Member of Clubs or Organizations:   . Attends Archivist Meetings:   Marland Kitchen Marital Status:   Intimate Partner Violence:   . Fear of Current or Ex-Partner:   . Emotionally Abused:   Marland Kitchen Physically Abused:   . Sexually Abused:      Review of Systems: General: negative for chills, fever, night sweats or weight changes.  Cardiovascular: negative for  chest pain, dyspnea on exertion, edema, orthopnea, palpitations, paroxysmal nocturnal dyspnea or shortness of breath Dermatological: negative for rash Respiratory: negative for cough or wheezing Urologic: negative for hematuria Abdominal: negative for nausea, vomiting, diarrhea, bright red blood per rectum, melena, or hematemesis Neurologic: negative for visual changes, syncope, or dizziness All other systems reviewed and are otherwise negative except as noted above.    Blood pressure (!) 148/109, pulse 95, height _0  (1.88 m), weight 220 lb 6.4 oz (100 kg), SpO2 99 %.  General appearance: alert and no distress Neck: no adenopathy, no carotid bruit, no JVD, supple, symmetrical, trachea midline and thyroid not enlarged, symmetric, no tenderness/mass/nodules Lungs: clear to auscultation bilaterally Heart: regular rate and rhythm, S1, S2 normal, no murmur, click, rub or gallop Extremities: extremities normal, atraumatic, no cyanosis or edema Pulses: 2+ and symmetric Skin: Skin color, texture, turgor normal. No rashes or lesions Neurologic: Alert and oriented X 3, normal strength and tone. Normal symmetric reflexes. Normal coordination and gait  EKG sinus rhythm at 95 with evidence of LVH with repolarization changes.  I personally reviewed this EKG.  ASSESSMENT AND PLAN:   NICM (nonischemic cardiomyopathy) ([place]) Recent mission for hypertensive urgency and chest pain with a 2D echo that showed an EF of 25 to 30% and a cardiac cath performed by myself 09/25/2019 revealing normal coronary arteries.  I suspect his decrease in LV function is multifactorial probably related to excessive alcohol intake and hypertensive heart disease.  We have talked about the importance of smoking and alcohol cessation, medication compliance.  I am going to increase his carvedilol from 12.5 to 25 mg twice daily.  He is already on Entresto 49/51.  I will have him see a Pharm.D. back in 4 weeks to review blood pressure  readings and titrate his Entresto.  We will get a 2D echo in 3 months.  If his EF does not improve I will refer him to EP for consideration of ICD implantation for primary prevention of sudden cardiac death.  Alcohol abuse Alcohol intake is decreased from a pint on multiple nights a week to several shots.  We talked about the importance of complete cessation.  Hyperlipidemia History of hyperlipidemia with lipid profile performed 09/26/2019 revealing total cholesterol 287, LDL 222 and HDL of 33.  We talked about heart healthy diet.  I am starting him on atorvastatin 20 mg a day and we will recheck a lipid liver profile in 2 months.      Lorretta Harp MD FACP,FACC,FAHA, [facility] 10/11/2019 5:17 PM

## 2019-10-12 ENCOUNTER — Encounter (INDEPENDENT_AMBULATORY_CARE_PROVIDER_SITE_OTHER): Payer: Self-pay | Admitting: Primary Care

## 2019-10-12 ENCOUNTER — Telehealth (INDEPENDENT_AMBULATORY_CARE_PROVIDER_SITE_OTHER): Payer: Self-pay | Admitting: Primary Care

## 2019-10-12 DIAGNOSIS — Z7689 Persons encountering health services in other specified circumstances: Secondary | ICD-10-CM

## 2019-10-12 DIAGNOSIS — Z09 Encounter for follow-up examination after completed treatment for conditions other than malignant neoplasm: Secondary | ICD-10-CM

## 2019-10-12 DIAGNOSIS — I428 Other cardiomyopathies: Secondary | ICD-10-CM

## 2019-10-12 NOTE — Progress Notes (Addendum)
Virtual Visit via Telephone Note  I connected with Derrick Hoffman on 10/12/19 at  2:30 PM EDT by telephone and verified that I am speaking with the correct person using two identifiers.   I discussed the limitations, risks, security and privacy concerns of performing an evaluation and management service by telephone and the availability of in person appointments. I also discussed with the patient that there may be a patient responsible charge related to this service. The patient expressed understanding and agreed to proceed. Patient was at home Juluis Mire, NP was at [facility]  History of Present Illness: Derrick Hoffman is a 44 year old male having a tele visit for establishment of care and hospital discharge. Admitted on 5/23 for systemic inflammatory response syndrome and discharge on 09/28/2019. Significant history of hypertension, tobacco abuse and drinks alcohol presented to the ER because of chest pain and shortness of breath.  Pain was  persistent retrosternal nonradiating with some shortness of breath and subjective feeling of fever chills.   Past Medical History:  Diagnosis Date  . Abnormal liver function test   . Anxiety   . Aortic atherosclerosis ([place])   . Childhood asthma   . Habitual alcohol use   . Hepatic steatosis   . History of kidney stones 1990  . Hypertension   . Hypokalemia   . Tobacco abuse    Current Outpatient Medications on File Prior to Visit  Medication Sig Dispense Refill  . aspirin EC 81 MG EC tablet Take 1 tablet (81 mg total) by mouth daily. 30 tablet 1  . atorvastatin (LIPITOR) 20 MG tablet Take 1 tablet (20 mg total) by mouth daily. 90 tablet 3  . carvedilol (COREG) 25 MG tablet Take 1 tablet (25 mg total) by mouth 2 (two) times daily with a meal. 008 tablet 1  . folic acid (FOLVITE) 1 MG tablet Take 1 tablet (1 mg total) by mouth daily. 30 tablet 1  . Multiple Vitamin (MULTIVITAMIN WITH MINERALS) TABS tablet Take 1 tablet by  mouth daily. 30 tablet 1  . pantoprazole (PROTONIX) 40 MG tablet Take 1 tablet (40 mg total) by mouth daily. 30 tablet 1  . sacubitril-valsartan (ENTRESTO) 49-51 MG Take 1 tablet by mouth 2 (two) times daily. 60 tablet 1  . spironolactone (ALDACTONE) 25 MG tablet Take 0.5 tablets (12.5 mg total) by mouth daily. 30 tablet 1  . thiamine 100 MG tablet Take 1 tablet (100 mg total) by mouth daily. 30 tablet 1   No current facility-administered medications on file prior to visit.   Observations/Objective: Review of Systems  All other systems reviewed and are negative.  Assessment and Plan: [place] was seen today for new patient (initial visit) and medication refill.  Diagnoses and all orders for this visit:  Hospital discharge follow-up Follow-up with [facility] [place] to establish primary care and hospital follow-up on 10/12/2019.  On follow-up patient will need a comprehensive metabolic profile done to follow-up on electrolytes and renal function as well as LFTs. Follow-up with [facility] health and wellness center for assistance with medications and cardiology/[facility] cardiovascular division in the outpatient setting.  Encounter to establish care Juluis Mire, NP-C will be your  (PCP) she is mastered prepared . She is skilled to diagnosed and treat illness. Also able to answer health concern as well as continuing care of varied medical conditions, not limited by cause, organ system, or diagnosis.   NICM (nonischemic cardiomyopathy) ([place]) secondary to ongoing  alcohol use/polysubstance use in the setting of hypertensive urgency/poorly controlled hypertension. 2D echo done with a EF of 25 to 30% with hypokinesis of the left ventricular/anteroseptal akinesis. Cardiac MRI which was done was confirmatory for nonischemic cardiomyopathy with no signs of infiltration,scar or evidence of amyloid noted. Patient underwent catheterization with clean coronaries noted.   Followed by Dr. Nanetta Batty cardiology   Follow Up Instructions:    I discussed the assessment and treatment plan with the patient. The patient was provided an opportunity to ask questions and all were answered. The patient agreed with the plan and demonstrated an understanding of the instructions.   The patient was advised to call back or seek an in-person evaluation if the symptoms worsen or if the condition fails to improve as anticipated.  I provided 20 minutes of non-face-to-face time during this encounter. Includes reviewing hospitalization , encounters, labs and images  Grayce Sessions, NP

## 2019-10-16 ENCOUNTER — Encounter (HOSPITAL_COMMUNITY): Payer: Self-pay | Admitting: Cardiovascular Disease

## 2019-10-17 ENCOUNTER — Telehealth: Payer: Self-pay | Admitting: Cardiovascular Disease

## 2019-10-17 NOTE — Telephone Encounter (Signed)
New message   Pts wife is calling about medication forms that were faxed last week    Please call

## 2019-10-17 NOTE — Telephone Encounter (Signed)
Wife notified that message was sent to nurse covering Dr. Allyson Sabal today to have MD sign form and return back to her so she can submit for patient for [facility] assistance.

## 2019-10-20 ENCOUNTER — Encounter: Payer: Self-pay | Admitting: *Deleted

## 2019-10-26 ENCOUNTER — Telehealth (HOSPITAL_COMMUNITY): Payer: Self-pay | Admitting: Cardiovascular Disease

## 2019-10-26 NOTE — Telephone Encounter (Signed)
Just an FYI. We have made several attempts to contact this patient including sending a letter to schedule or reschedule their echocardiogram. We will be removing the patient from the echo WQ.   MAILED LETTER  10/16/19 Called and NA and NVM set up @ 2:42/LBW  10/13/19 called and NA and NVM set up @ 10:31/LBW  10/12/19 Called and NVM set up @ 11:31/LBW      Thank you
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
## 2020-03-03 ENCOUNTER — Emergency Department (HOSPITAL_BASED_OUTPATIENT_CLINIC_OR_DEPARTMENT_OTHER): Payer: Self-pay

## 2020-03-03 ENCOUNTER — Encounter (HOSPITAL_BASED_OUTPATIENT_CLINIC_OR_DEPARTMENT_OTHER): Payer: Self-pay | Admitting: Emergency Medicine

## 2020-03-03 ENCOUNTER — Observation Stay (HOSPITAL_BASED_OUTPATIENT_CLINIC_OR_DEPARTMENT_OTHER)
Admission: EM | Admit: 2020-03-03 | Discharge: 2020-03-05 | Disposition: A | Discharge status: Home / Self Care | Payer: Self-pay | Attending: Internal Medicine | Admitting: Internal Medicine

## 2020-03-03 DIAGNOSIS — R778 Other specified abnormalities of plasma proteins: Secondary | ICD-10-CM | POA: Diagnosis present

## 2020-03-03 DIAGNOSIS — R7989 Other specified abnormal findings of blood chemistry: Secondary | ICD-10-CM | POA: Diagnosis present

## 2020-03-03 DIAGNOSIS — Z79899 Other long term (current) drug therapy: Secondary | ICD-10-CM | POA: Insufficient documentation

## 2020-03-03 DIAGNOSIS — I428 Other cardiomyopathies: Secondary | ICD-10-CM

## 2020-03-03 DIAGNOSIS — D72829 Elevated white blood cell count, unspecified: Secondary | ICD-10-CM | POA: Diagnosis present

## 2020-03-03 DIAGNOSIS — R0602 Shortness of breath: Secondary | ICD-10-CM | POA: Insufficient documentation

## 2020-03-03 DIAGNOSIS — E876 Hypokalemia: Secondary | ICD-10-CM | POA: Diagnosis present

## 2020-03-03 DIAGNOSIS — I161 Hypertensive emergency: Principal | ICD-10-CM | POA: Diagnosis present

## 2020-03-03 DIAGNOSIS — I10 Essential (primary) hypertension: Secondary | ICD-10-CM | POA: Insufficient documentation

## 2020-03-03 DIAGNOSIS — F1010 Alcohol abuse, uncomplicated: Secondary | ICD-10-CM | POA: Diagnosis present

## 2020-03-03 DIAGNOSIS — F17210 Nicotine dependence, cigarettes, uncomplicated: Secondary | ICD-10-CM | POA: Insufficient documentation

## 2020-03-03 DIAGNOSIS — Z20822 Contact with and (suspected) exposure to covid-19: Secondary | ICD-10-CM | POA: Insufficient documentation

## 2020-03-03 DIAGNOSIS — E785 Hyperlipidemia, unspecified: Secondary | ICD-10-CM | POA: Diagnosis present

## 2020-03-03 DIAGNOSIS — R079 Chest pain, unspecified: Secondary | ICD-10-CM | POA: Diagnosis present

## 2020-03-03 DIAGNOSIS — Z7982 Long term (current) use of aspirin: Secondary | ICD-10-CM | POA: Insufficient documentation

## 2020-03-03 LAB — BASIC METABOLIC PANEL
Anion gap: 15 (ref 5–15)
BUN: 7 mg/dL (ref 6–20)
CO2: 25 mmol/L (ref 22–32)
Calcium: 9.1 mg/dL (ref 8.9–10.3)
Chloride: 98 mmol/L (ref 98–111)
Creatinine, Ser: 1.05 mg/dL (ref 0.61–1.24)
GFR, Estimated: >60 mL/min (ref >60)
Glucose, Bld: 119 mg/dL — ABNORMAL HIGH (ref 70–99)
Potassium: 3 mmol/L — ABNORMAL LOW (ref 3.5–5.1)
Sodium: 138 mmol/L (ref 135–145)

## 2020-03-03 LAB — CBC
HCT: 50.1 % (ref 39.0–52.0)
Hemoglobin: 17.1 g/dL — ABNORMAL HIGH (ref 13.0–17.0)
MCH: 32.9 pg (ref 26.0–34.0)
MCHC: 34.1 g/dL (ref 30.0–36.0)
MCV: 96.5 fL (ref 80.0–100.0)
Platelets: 247 10*3/uL (ref 150–400)
RBC: 5.19 MIL/uL (ref 4.22–5.81)
RDW: 14.7 % (ref 11.5–15.5)
WBC: 13.1 10*3/uL — ABNORMAL HIGH (ref 4.0–10.5)
nRBC: 0 % (ref 0.0–0.2)

## 2020-03-03 LAB — HEPATIC FUNCTION PANEL
ALT: 137 U/L — ABNORMAL HIGH (ref 0–44)
AST: 222 U/L — ABNORMAL HIGH (ref 15–41)
Albumin: 4.5 g/dL (ref 3.5–5.0)
Alkaline Phosphatase: 117 U/L (ref 38–126)
Bilirubin, Direct: 0.9 mg/dL — ABNORMAL HIGH (ref 0.0–0.2)
Indirect Bilirubin: 1.8 mg/dL — ABNORMAL HIGH (ref 0.3–0.9)
Total Bilirubin: 2.7 mg/dL — ABNORMAL HIGH (ref 0.3–1.2)
Total Protein: 9.1 g/dL — ABNORMAL HIGH (ref 6.5–8.1)

## 2020-03-03 LAB — RAPID URINE DRUG SCREEN, HOSP PERFORMED
Amphetamines: NOT DETECTED
Barbiturates: NOT DETECTED
Benzodiazepines: NOT DETECTED
Cocaine: NOT DETECTED
Opiates: NOT DETECTED
Tetrahydrocannabinol: POSITIVE — AB

## 2020-03-03 LAB — RESPIRATORY PANEL BY RT PCR (FLU A&B, COVID)
Influenza A by PCR: NEGATIVE
Influenza B by PCR: NEGATIVE
SARS Coronavirus 2 by RT PCR: NEGATIVE

## 2020-03-03 LAB — BRAIN NATRIURETIC PEPTIDE: B Natriuretic Peptide: 350 pg/mL — ABNORMAL HIGH (ref 0.0–100.0)

## 2020-03-03 LAB — TROPONIN I (HIGH SENSITIVITY): Troponin I (High Sensitivity): 29 ng/L — ABNORMAL HIGH (ref <18)

## 2020-03-03 LAB — LIPASE, BLOOD: Lipase: 33 U/L (ref 11–51)

## 2020-03-03 MED ORDER — FOLIC ACID 1 MG PO TABS
1.0000 mg | ORAL_TABLET | Freq: Every day | ORAL | Status: DC
  Administered 2020-03-04 – 2020-03-05 (×2): 1 mg via ORAL
  Filled 2020-03-03 (×3): qty 1

## 2020-03-03 MED ORDER — LABETALOL HCL 5 MG/ML IV SOLN
10.0000 mg | Freq: Once | INTRAVENOUS | Status: AC
  Administered 2020-03-03: 10 mg via INTRAVENOUS
  Filled 2020-03-03: qty 4

## 2020-03-03 MED ORDER — THIAMINE HCL 100 MG PO TABS
100.0000 mg | ORAL_TABLET | Freq: Every day | ORAL | Status: DC
  Administered 2020-03-04 – 2020-03-05 (×2): 100 mg via ORAL
  Filled 2020-03-03 (×3): qty 1

## 2020-03-03 MED ORDER — PANTOPRAZOLE SODIUM 40 MG PO TBEC
40.0000 mg | DELAYED_RELEASE_TABLET | Freq: Every day | ORAL | Status: DC
  Administered 2020-03-04 – 2020-03-05 (×2): 40 mg via ORAL
  Filled 2020-03-03 (×3): qty 1

## 2020-03-03 MED ORDER — LORAZEPAM 1 MG PO TABS
0.0000 mg | ORAL_TABLET | Freq: Four times a day (QID) | ORAL | Status: DC
  Administered 2020-03-03 – 2020-03-05 (×4): 1 mg via ORAL
  Filled 2020-03-03 (×4): qty 1

## 2020-03-03 MED ORDER — LORAZEPAM 1 MG PO TABS: 0.0000 mg | ORAL_TABLET | Freq: Two times a day (BID) | ORAL | Status: DC

## 2020-03-03 MED ORDER — THIAMINE HCL 100 MG/ML IJ SOLN: 100.0000 mg | Freq: Every day | INTRAMUSCULAR | Status: DC

## 2020-03-03 MED ORDER — LORAZEPAM 2 MG/ML IJ SOLN: 0.0000 mg | Freq: Two times a day (BID) | INTRAMUSCULAR | Status: DC

## 2020-03-03 MED ORDER — LORAZEPAM 2 MG/ML IJ SOLN
1.0000 mg | Freq: Once | INTRAMUSCULAR | Status: AC
  Administered 2020-03-03: 1 mg via INTRAVENOUS
  Filled 2020-03-03: qty 1

## 2020-03-03 MED ORDER — LORAZEPAM 2 MG/ML IJ SOLN: 0.0000 mg | Freq: Four times a day (QID) | INTRAMUSCULAR | Status: DC

## 2020-03-03 MED ORDER — MORPHINE SULFATE (PF) 4 MG/ML IV SOLN: 4.0000 mg | Freq: Once | INTRAVENOUS | Status: DC

## 2020-03-03 MED ORDER — POTASSIUM CHLORIDE CRYS ER 20 MEQ PO TBCR
40.0000 meq | EXTENDED_RELEASE_TABLET | Freq: Once | ORAL | Status: AC
  Administered 2020-03-03: 40 meq via ORAL
  Filled 2020-03-03: qty 2

## 2020-03-03 MED ORDER — SPIRONOLACTONE 12.5 MG HALF TABLET
12.5000 mg | ORAL_TABLET | Freq: Every day | ORAL | Status: DC
  Administered 2020-03-04: 12.5 mg via ORAL
  Filled 2020-03-03 (×3): qty 1

## 2020-03-03 MED ORDER — SODIUM CHLORIDE 0.9 % IV SOLN
INTRAVENOUS | Status: DC | PRN
  Administered 2020-03-03: 250 mL via INTRAVENOUS

## 2020-03-03 MED ORDER — THIAMINE HCL 100 MG PO TABS: 100.0000 mg | ORAL_TABLET | Freq: Every day | ORAL | Status: DC

## 2020-03-03 MED ORDER — IOHEXOL 350 MG/ML SOLN
100.0000 mL | Freq: Once | INTRAVENOUS | Status: AC | PRN
  Administered 2020-03-03: 100 mL via INTRAVENOUS

## 2020-03-03 MED ORDER — ASPIRIN EC 81 MG PO TBEC
81.0000 mg | DELAYED_RELEASE_TABLET | Freq: Every day | ORAL | Status: DC
  Administered 2020-03-04 – 2020-03-05 (×2): 81 mg via ORAL
  Filled 2020-03-03 (×2): qty 1

## 2020-03-03 MED ORDER — SACUBITRIL-VALSARTAN 49-51 MG PO TABS
1.0000 | ORAL_TABLET | Freq: Two times a day (BID) | ORAL | Status: DC
  Administered 2020-03-04 – 2020-03-05 (×4): 1 via ORAL
  Filled 2020-03-03 (×5): qty 1

## 2020-03-03 MED ORDER — POTASSIUM CHLORIDE 10 MEQ/100ML IV SOLN
10.0000 meq | INTRAVENOUS | Status: AC
  Administered 2020-03-03 – 2020-03-04 (×2): 10 meq via INTRAVENOUS
  Filled 2020-03-03 (×2): qty 100

## 2020-03-03 MED ORDER — CARVEDILOL 25 MG PO TABS
25.0000 mg | ORAL_TABLET | Freq: Two times a day (BID) | ORAL | Status: DC
  Administered 2020-03-04 – 2020-03-05 (×4): 25 mg via ORAL
  Filled 2020-03-03 (×2): qty 1
  Filled 2020-03-03: qty 2
  Filled 2020-03-03: qty 1

## 2020-03-03 NOTE — ED Provider Notes (Signed)
[facility] EMERGENCY DEPARTMENT Provider Note   CSN: [id number] Arrival date & time: 03/03/20  2045     History Chief Complaint  Patient presents with  . Chest Pain    Derrick Hoffman is a 44 y.o. male.  The history is provided by the patient.  Chest Pain Pain location:  Substernal area Pain quality: pressure   Pain radiates to:  Does not radiate Pain severity:  Moderate Onset quality:  Gradual Duration:  1 day Timing:  Constant Progression:  Unchanged Chronicity:  New Context: at rest   Relieved by:  Nothing Worsened by:  Nothing Associated symptoms: diaphoresis   Associated symptoms: no abdominal pain, no back pain, no cough, no fever, no palpitations, no shortness of breath and no vomiting   Risk factors: high cholesterol and hypertension        Past Medical History:  Diagnosis Date  . Abnormal liver function test   . Anxiety   . Aortic atherosclerosis (HCC)   . Childhood asthma   . Habitual alcohol use   . Hepatic steatosis   . History of kidney stones 1990  . Hypertension   . Hypokalemia   . Tobacco abuse     Patient Active Problem List   Diagnosis Date Noted  . Hyperlipidemia 10/11/2019  . Hypertensive emergency   . Alcohol abuse 09/27/2019  . Leukocytosis   . Hypokalemia   . Hypophosphatemia   . Hypertensive urgency 09/25/2019  . Tick bite 09/25/2019  . Chest pain 09/25/2019  . NICM (nonischemic cardiomyopathy) (HCC) 09/25/2019  . Elevated troponin   . SIRS (systemic inflammatory response syndrome) (HCC) 09/24/2019  . Appendicitis 01/05/2018    Past Surgical History:  Procedure Laterality Date  . ANTERIOR CRUCIATE LIGAMENT REPAIR Right 1996  . APPENDECTOMY  01/05/2018  . BUNIONECTOMY Left 2013  . LAPAROSCOPIC APPENDECTOMY N/A 01/05/2018   Procedure: APPENDECTOMY LAPAROSCOPIC;  Surgeon: Griselda Miner, MD;  Location: [facility] OR;  Service: General;  Laterality: N/A;  . LEFT HEART CATH AND CORONARY ANGIOGRAPHY N/A 09/25/2019    Procedure: LEFT HEART CATH AND CORONARY ANGIOGRAPHY;  Surgeon: Runell Gess, MD;  Location: [facility] INVASIVE CV LAB;  Service: Cardiovascular;  Laterality: N/A;  . ORIF TRIPOD FRACTURE N/A 11/03/2018   Procedure: OPEN REDUCTION INTERNAL FIXATION (ORIF) TRIPOD FRACTURE;  Surgeon: Suzanna Obey, MD;  Location: [facility] OR;  Service: ENT;  Laterality: N/A;  ORIF tripod fracture- malar fracture       Family History  Problem Relation Age of Onset  . CAD Mother        MI age 2    Social History   Tobacco Use  . Smoking status: Current Every Day Smoker    Packs/day: 0.50    Years: 1.00    Pack years: 0.50  . Smokeless tobacco: Never Used  Vaping Use  . Vaping Use: Never used  Substance Use Topics  . Alcohol use: Yes    Alcohol/week: 48.0 standard drinks    Types: 48 Shots of liquor per week    Comment: 01/05/2018 " 5th of liquor/day;  3 days/wk  . Drug use: Not Currently    Home Medications Prior to Admission medications   Medication Sig Start Date End Date Taking? Authorizing Provider  aspirin EC 81 MG EC tablet Take 1 tablet (81 mg total) by mouth daily. 09/29/19   Rodolph Bong, MD  atorvastatin (LIPITOR) 20 MG tablet Take 1 tablet (20 mg total) by mouth daily. 10/11/19 01/09/20  Runell Gess, MD  carvedilol (COREG) 25 MG tablet Take 1 tablet (25 mg total) by mouth 2 (two) times daily with a meal. 10/11/19   Runell Gess, MD  folic acid (FOLVITE) 1 MG tablet Take 1 tablet (1 mg total) by mouth daily. 09/29/19   Rodolph Bong, MD  Multiple Vitamin (MULTIVITAMIN WITH MINERALS) TABS tablet Take 1 tablet by mouth daily. 09/29/19   Rodolph Bong, MD  pantoprazole (PROTONIX) 40 MG tablet Take 1 tablet (40 mg total) by mouth daily. 09/28/19 09/27/20  Rodolph Bong, MD  sacubitril-valsartan (ENTRESTO) 49-51 MG Take 1 tablet by mouth 2 (two) times daily. 09/28/19   Rodolph Bong, MD  spironolactone (ALDACTONE) 25 MG tablet Take 0.5 tablets (12.5 mg total) by mouth daily.  09/29/19   Rodolph Bong, MD  thiamine 100 MG tablet Take 1 tablet (100 mg total) by mouth daily. 09/29/19   Rodolph Bong, MD    Allergies    Penicillins  Review of Systems   Review of Systems  Constitutional: Positive for diaphoresis. Negative for chills and fever.  HENT: Negative for ear pain and sore throat.   Eyes: Negative for pain and visual disturbance.  Respiratory: Negative for cough and shortness of breath.   Cardiovascular: Positive for chest pain. Negative for palpitations.  Gastrointestinal: Negative for abdominal pain and vomiting.  Genitourinary: Negative for dysuria and hematuria.  Musculoskeletal: Negative for arthralgias and back pain.  Skin: Negative for color change and rash.  Neurological: Negative for seizures and syncope.  All other systems reviewed and are negative.   Physical Exam Updated Vital Signs  ED Triage Vitals  Enc Vitals Group     BP 03/03/20 2103 (!) 194/134     Pulse Rate 03/03/20 2103 (!) 115     Resp 03/03/20 2103 18     Temp 03/03/20 2103 99 F (37.2 C)     Temp Source 03/03/20 2103 Oral     SpO2 03/03/20 2103 99 %     Weight 03/03/20 2106 225 lb (102.1 kg)     Height 03/03/20 2106 6\' 2"  (1.88 m)     Head Circumference --      Peak Flow --      Pain Score 03/03/20 2105 8     Pain Loc --      Pain Edu? --      Excl. in GC? --     Physical Exam Vitals and nursing note reviewed.  Constitutional:      General: He is not in acute distress.    Appearance: He is well-developed. He is diaphoretic. He is not ill-appearing.  HENT:     Head: Normocephalic and atraumatic.  Eyes:     Extraocular Movements: Extraocular movements intact.     Conjunctiva/sclera: Conjunctivae normal.     Pupils: Pupils are equal, round, and reactive to light.  Cardiovascular:     Rate and Rhythm: Normal rate and regular rhythm.     Pulses:          Radial pulses are 2+ on the right side and 2+ on the left side.     Heart sounds: Normal heart  sounds. No murmur heard.   Pulmonary:     Effort: Pulmonary effort is normal. No respiratory distress.     Breath sounds: Normal breath sounds. No decreased breath sounds or wheezing.  Abdominal:     Palpations: Abdomen is soft.     Tenderness: There is no abdominal tenderness.  Musculoskeletal:  General: Normal range of motion.     Cervical back: Normal range of motion and neck supple.  Skin:    General: Skin is warm.     Capillary Refill: Capillary refill takes less than 2 seconds.  Neurological:     General: No focal deficit present.     Mental Status: He is alert.     ED Results / Procedures / Treatments   Labs (all labs ordered are listed, but only abnormal results are displayed) Labs Reviewed  BASIC METABOLIC PANEL - Abnormal; Notable for the following components:      Result Value   Potassium 3.0 (*)    Glucose, Bld 119 (*)    All other components within normal limits  CBC - Abnormal; Notable for the following components:   WBC 13.1 (*)    Hemoglobin 17.1 (*)    All other components within normal limits  HEPATIC FUNCTION PANEL - Abnormal; Notable for the following components:   Total Protein 9.1 (*)    AST 222 (*)    ALT 137 (*)    Total Bilirubin 2.7 (*)    Bilirubin, Direct 0.9 (*)    Indirect Bilirubin 1.8 (*)    All other components within normal limits  RAPID URINE DRUG SCREEN, HOSP PERFORMED - Abnormal; Notable for the following components:   Tetrahydrocannabinol POSITIVE (*)    All other components within normal limits  BRAIN NATRIURETIC PEPTIDE - Abnormal; Notable for the following components:   B Natriuretic Peptide 350.0 (*)    All other components within normal limits  TROPONIN I (HIGH SENSITIVITY) - Abnormal; Notable for the following components:   Troponin I (High Sensitivity) 29 (*)    All other components within normal limits  RESPIRATORY PANEL BY RT PCR (FLU A&B, COVID)  LIPASE, BLOOD  TROPONIN I (HIGH SENSITIVITY)    EKG EKG  Interpretation  Date/Time:  Sunday March 03 2020 20:59:17 EDT Ventricular Rate:  117 PR Interval:  138 QRS Duration: 110 QT Interval:  370 QTC Calculation: 516 R Axis:   -50 Text Interpretation: Sinus tachycardia Right atrial enlargement Left anterior fascicular block Left ventricular hypertrophy ( R in aVL , Sokolow-Lyon , Cornell product ) Septal infarct , age undetermined ST & T wave abnormality, consider inferolateral ischemia Abnormal ECG Confirmed by Virgina Norfolk [phone]) on 03/03/2020 9:08:11 PM   Radiology DG Chest 2 View  Result Date: 03/03/2020 CLINICAL DATA:  Left-sided chest pain. EXAM: CHEST - 2 VIEW COMPARISON:  Sep 24, 2019 FINDINGS: The heart size and mediastinal contours are within normal limits. Both lungs are clear. The visualized skeletal structures are unremarkable. IMPRESSION: No active cardiopulmonary disease. Electronically Signed   By: Aram Candela M.D.   On: 03/03/2020 21:19   CT Angio Chest/Abd/Pel for Dissection W and/or Wo Contrast  Result Date: 03/03/2020 CLINICAL DATA:  Left-sided chest pain. EXAM: CT ANGIOGRAPHY CHEST, ABDOMEN AND PELVIS TECHNIQUE: Non-contrast CT of the chest was initially obtained. Multidetector CT imaging through the chest, abdomen and pelvis was performed using the standard protocol during bolus administration of intravenous contrast. Multiplanar reconstructed images and MIPs were obtained and reviewed to evaluate the vascular anatomy. CONTRAST:  OMNIPAQUE IOHEXOL 350 MG/ML SOLN COMPARISON:  Sep 24, 2019 FINDINGS: CTA CHEST FINDINGS Cardiovascular: The thoracic aorta is normal in appearance, without evidence of aneurysmal dilatation or dissection. Satisfactory opacification of the pulmonary arteries to the segmental level. No evidence of pulmonary embolism. Normal heart size. No pericardial effusion. Mediastinum/Nodes: No enlarged mediastinal, hilar, or axillary  lymph nodes. Thyroid gland, trachea, and esophagus demonstrate no  significant findings. Lungs/Pleura: Lungs are clear. No pleural effusion or pneumothorax. Musculoskeletal: No chest wall abnormality. No acute or significant osseous findings. Review of the MIP images confirms the above findings. CTA ABDOMEN AND PELVIS FINDINGS VASCULAR Aorta: Normal caliber aorta without aneurysm, dissection, vasculitis or significant stenosis. Celiac: Patent without evidence of aneurysm, dissection, vasculitis or significant stenosis. SMA: Patent without evidence of aneurysm, dissection, vasculitis or significant stenosis. Renals: Both renal arteries are patent without evidence of aneurysm, dissection, vasculitis, fibromuscular dysplasia or significant stenosis. IMA: Patent without evidence of aneurysm, dissection, vasculitis or significant stenosis. Inflow: Patent without evidence of aneurysm, dissection, vasculitis or significant stenosis. Veins: No obvious venous abnormality within the limitations of this arterial phase study. Review of the MIP images confirms the above findings. NON-VASCULAR Hepatobiliary: There is diffuse fatty infiltration of the liver parenchyma. No focal liver abnormality is seen. No gallstones, gallbladder wall thickening, or biliary dilatation. Pancreas: Unremarkable. No pancreatic ductal dilatation or surrounding inflammatory changes. Spleen: Normal in size without focal abnormality. Adrenals/Urinary Tract: Adrenal glands are unremarkable. Kidneys are normal, without renal calculi, focal lesion, or hydronephrosis. The urinary bladder is contracted and subsequently limited in evaluation. Stomach/Bowel: Stomach is within normal limits. The appendix is not clearly identified. No evidence of bowel wall thickening, distention, or inflammatory changes. Noninflamed diverticula are seen within the sigmoid colon. Lymphatic: No abnormal abdominal or pelvic lymph nodes are identified. Reproductive: Prostate is unremarkable. Other: No abdominal wall hernia or abnormality. No  abdominopelvic ascites. Musculoskeletal: No acute or significant osseous findings. Review of the MIP images confirms the above findings. IMPRESSION: 1. No evidence of aortic dissection or aneurysm. 2. No evidence of pulmonary embolism. 3. Hepatic steatosis. 4. Sigmoid diverticulosis. Electronically Signed   By: Aram Candela M.D.   On: 03/03/2020 22:37    Procedures .Critical Care Performed by: Virgina Norfolk, DO Authorized by: Virgina Norfolk, DO   Critical care provider statement:    Critical care time (minutes):  45   Critical care was necessary to treat or prevent imminent or life-threatening deterioration of the following conditions:  Cardiac failure   Critical care was time spent personally by me on the following activities:  Blood draw for specimens, development of treatment plan with patient or surrogate, discussions with primary provider, discussions with consultants, evaluation of patient's response to treatment, obtaining history from patient or surrogate, examination of patient, ordering and performing treatments and interventions, ordering and review of laboratory studies, ordering and review of radiographic studies, pulse oximetry, review of old charts and re-evaluation of patient's condition   I assumed direction of critical care for this patient from another provider in my specialty: no     (including critical care time)  Medications Ordered in ED Medications  labetalol (NORMODYNE) injection 10 mg (10 mg Intravenous Given 03/03/20 2131)  LORazepam (ATIVAN) injection 1 mg (1 mg Intravenous Given 03/03/20 2130)  iohexol (OMNIPAQUE) 350 MG/ML injection 100 mL (100 mLs Intravenous Contrast Given 03/03/20 2212)    ED Course  I have reviewed the triage vital signs and the nursing notes.  Pertinent labs & imaging results that were available during my care of the patient were reviewed by me and considered in my medical decision making (see chart for details).    MDM  Rules/Calculators/A&P                          Derrick Hoffman is a 44 year old  male with history of nonischemic cardiomyopathy presents to the ED with chest pain.  Patient hypertensive to 194/134, tachycardic to 115 but otherwise unremarkable vitals.  Has had fairly persistent chest pain since last night.  Has diaphoresis.  Had extensive cardiac work-up in May.  Upon chart review it appears that he had clean coronaries at that time.  Had a normal cardiac MRI.  Overall he was diagnosed with nonischemic cardiomyopathy likely in the setting of alcohol and polysubstance abuse.  He states that he still drinks pretty heavily.  His last drink was about 2 days ago.  Denies any nausea vomiting or abdominal pain.  Has not been on any of his medications that he was discharged with and has not followed up with cardiology since that work-up in May.  Overall suspect possible ACS but given clean coronaries this seems less likely.  Suspect that he has demand from uncontrolled hypertension.  EKG shows a sinus tachycardia.  He has T wave inversions throughout.  Overall EKG looks fairly consistent to his prior.  Does not appear to have any obvious ST elevation to call a STEMI but will touch base with cardiology about this.  Overall suspect that changes are from demand from hypertension and tachycardia.  Will give IV labetalol as well as IV Ativan as possibly some component of alcohol withdrawal.  Anticipate admission.  Patient with no dissection on CT scan.  No PE.  Troponin mildly elevated.  BNP mildly elevated.  Patient feeling improved following labetalol, Ativan.  No pneumonia.  No pneumothorax.  Liver enzymes mildly elevated.  Screen positive for marijuana.  Otherwise no significant anemia, electrolyte abnormality, kidney injury.  Talked with cardiology, Dr. Mayford Knife on the phone and they recommend admission for further blood pressure control, new echocardiogram.  Differential is hypertensive emergency versus alcohol  withdrawal.  Could also be ongoing nonischemic cardiomyopathy secondary to alcohol use.  Will admit to medicine for further work-up.  This chart was dictated using voice recognition software.  Despite best efforts to proofread,  errors can occur which can change the documentation meaning.   Final Clinical Impression(s) / ED Diagnoses Final diagnoses:  Hypertensive emergency    Rx / DC Orders ED Discharge Orders    None       Virgina Norfolk, DO 03/03/20 2253

## 2020-03-03 NOTE — ED Triage Notes (Signed)
Reports left sided chest pain that started last night while in bed.  Describes as sharp.  Hx of MI 6 months ago.  Unable to afford his medications so he has not been taking any.  Noted to be diaphoretic in triage.

## 2020-03-03 NOTE — ED Notes (Signed)
EDP at bedside  

## 2020-03-03 NOTE — ED Notes (Signed)
Pt to CT

## 2020-03-04 ENCOUNTER — Other Ambulatory Visit: Payer: Self-pay

## 2020-03-04 ENCOUNTER — Encounter (HOSPITAL_COMMUNITY): Payer: Self-pay | Admitting: Family Medicine

## 2020-03-04 DIAGNOSIS — R778 Other specified abnormalities of plasma proteins: Secondary | ICD-10-CM

## 2020-03-04 DIAGNOSIS — F1010 Alcohol abuse, uncomplicated: Secondary | ICD-10-CM

## 2020-03-04 DIAGNOSIS — E782 Mixed hyperlipidemia: Secondary | ICD-10-CM

## 2020-03-04 DIAGNOSIS — R079 Chest pain, unspecified: Secondary | ICD-10-CM

## 2020-03-04 DIAGNOSIS — I5020 Unspecified systolic (congestive) heart failure: Secondary | ICD-10-CM

## 2020-03-04 DIAGNOSIS — D72829 Elevated white blood cell count, unspecified: Secondary | ICD-10-CM

## 2020-03-04 DIAGNOSIS — E876 Hypokalemia: Secondary | ICD-10-CM

## 2020-03-04 DIAGNOSIS — I161 Hypertensive emergency: Secondary | ICD-10-CM

## 2020-03-04 DIAGNOSIS — Z716 Tobacco abuse counseling: Secondary | ICD-10-CM

## 2020-03-04 LAB — PROTIME-INR
INR: 1.1 (ref 0.8–1.2)
Prothrombin Time: 14.1 seconds (ref 11.4–15.2)

## 2020-03-04 LAB — POTASSIUM: Potassium: 3.6 mmol/L (ref 3.5–5.1)

## 2020-03-04 LAB — TROPONIN I (HIGH SENSITIVITY)
Troponin I (High Sensitivity): 40 ng/L — ABNORMAL HIGH (ref <18)
Troponin I (High Sensitivity): 25 ng/L — ABNORMAL HIGH (ref <18)
Troponin I (High Sensitivity): 28 ng/L — ABNORMAL HIGH (ref <18)

## 2020-03-04 LAB — TSH: TSH: 0.874 u[IU]/mL (ref 0.350–4.500)

## 2020-03-04 LAB — PHOSPHORUS: Phosphorus: 2.5 mg/dL (ref 2.5–4.6)

## 2020-03-04 MED ORDER — HEPARIN SODIUM (PORCINE) 5000 UNIT/ML IJ SOLN
5000.0000 [IU] | Freq: Three times a day (TID) | INTRAMUSCULAR | Status: DC
  Filled 2020-03-04: qty 1

## 2020-03-04 MED ORDER — LACTATED RINGERS IV SOLN: INTRAVENOUS | Status: AC

## 2020-03-04 MED ORDER — THIAMINE HCL 100 MG PO TABS: 100.0000 mg | ORAL_TABLET | Freq: Every day | ORAL | Status: DC

## 2020-03-04 MED ORDER — ATORVASTATIN CALCIUM 10 MG PO TABS
20.0000 mg | ORAL_TABLET | Freq: Every day | ORAL | Status: DC
  Administered 2020-03-04 – 2020-03-05 (×2): 20 mg via ORAL
  Filled 2020-03-04 (×2): qty 2

## 2020-03-04 MED ORDER — SPIRONOLACTONE 12.5 MG HALF TABLET: 12.5000 mg | ORAL_TABLET | Freq: Every day | ORAL | Status: DC

## 2020-03-04 MED ORDER — LIDOCAINE 5 % EX PTCH
1.0000 | MEDICATED_PATCH | CUTANEOUS | Status: DC
  Administered 2020-03-04: 1 via TRANSDERMAL
  Filled 2020-03-04: qty 1

## 2020-03-04 MED ORDER — PANTOPRAZOLE SODIUM 40 MG PO TBEC: 40.0000 mg | DELAYED_RELEASE_TABLET | Freq: Every day | ORAL | Status: DC

## 2020-03-04 MED ORDER — CARVEDILOL 25 MG PO TABS: 25.0000 mg | ORAL_TABLET | Freq: Two times a day (BID) | ORAL | Status: DC

## 2020-03-04 MED ORDER — POTASSIUM CHLORIDE CRYS ER 20 MEQ PO TBCR
40.0000 meq | EXTENDED_RELEASE_TABLET | Freq: Once | ORAL | Status: AC
  Administered 2020-03-04: 40 meq via ORAL
  Filled 2020-03-04: qty 2

## 2020-03-04 MED ORDER — FOLIC ACID 1 MG PO TABS: 1.0000 mg | ORAL_TABLET | Freq: Every day | ORAL | Status: DC

## 2020-03-04 MED ORDER — SACUBITRIL-VALSARTAN 49-51 MG PO TABS: 1.0000 | ORAL_TABLET | Freq: Two times a day (BID) | ORAL | Status: DC

## 2020-03-04 MED ORDER — ASPIRIN 81 MG PO TBEC: 81.0000 mg | DELAYED_RELEASE_TABLET | Freq: Every day | ORAL | Status: DC

## 2020-03-04 MED ORDER — HEPARIN SODIUM (PORCINE) 5000 UNIT/ML IJ SOLN: 5000.0000 [IU] | Freq: Three times a day (TID) | INTRAMUSCULAR | Status: DC

## 2020-03-04 MED ORDER — ADULT MULTIVITAMIN W/MINERALS CH
1.0000 | ORAL_TABLET | Freq: Every day | ORAL | Status: DC
  Administered 2020-03-04 – 2020-03-05 (×2): 1 via ORAL
  Filled 2020-03-04 (×2): qty 1

## 2020-03-04 NOTE — ED Notes (Signed)
Report to 3 [place]

## 2020-03-04 NOTE — ED Notes (Signed)
Girlfriend at bedside.

## 2020-03-04 NOTE — Consult Note (Addendum)
Cardiology Consultation:   Patient ID: MARTRELL EGUIA; [id number]; April 24, 1976   Admit date: 03/03/2020 Date of Consult: 03/04/2020  Primary Care Provider: Patient, No Pcp Per Primary Cardiologist: Nanetta Batty, MD Primary Electrophysiologist:  None  Patient Profile:   CLENT DAMORE is a 44 y.o. male with a PMH of chronic combined HF/NICM, HTN, HLD, ETOH abuse, and tobacco abuse, who is being seen today for the evaluation of HF at the request of Dr. Sedalia Muta.  History of Present Illness:   Mr. Diffee notes that he has been doing well after last cardiology visit.  Was able to wean down his tobacco and alcohol use.  Patient ran out of his medications around 10/20/19.  Did not received the Nov[employer] patient assistance applications that was sent to him.  Did not return for echocardiogram.  Ran out of his medications but felt fine.  03/03/20 Patient returned [facility] for chest pain.  Patient had been having fluttering chest pain that kept him from daily activities.  Up until two weeks prior had been playing basketball.  On admission, notes fatigue.  Patient denies substance abuse and has decreased his tobacco and alcohol (down to 2 nights a week).    Received labetalol for HTN urgency at [facility].  Minimal novel troponin elevation.  His prior medications have been returned as part of his admission and transfer.  Presently, feels no SOB, CP.  Still feels fatigued.  No weight gain, no PND, or orthopnea.  No bendopnea.  No palpitations.  No syncope.    Past Medical History:  Diagnosis Date  . Abnormal liver function test   . Anxiety   . Aortic atherosclerosis (HCC)   . Childhood asthma   . Habitual alcohol use   . Hepatic steatosis   . History of kidney stones 1990  . Hypertension   . Hypokalemia   . Tobacco abuse     Past Surgical History:  Procedure Laterality Date  . ANTERIOR CRUCIATE LIGAMENT REPAIR Right 1996  . APPENDECTOMY  01/05/2018  . BUNIONECTOMY Left 2013  . LAPAROSCOPIC APPENDECTOMY  N/A 01/05/2018   Procedure: APPENDECTOMY LAPAROSCOPIC;  Surgeon: Griselda Miner, MD;  Location: [facility] OR;  Service: General;  Laterality: N/A;  . LEFT HEART CATH AND CORONARY ANGIOGRAPHY N/A 09/25/2019   Procedure: LEFT HEART CATH AND CORONARY ANGIOGRAPHY;  Surgeon: Runell Gess, MD;  Location: [facility] INVASIVE CV LAB;  Service: Cardiovascular;  Laterality: N/A;  . ORIF TRIPOD FRACTURE N/A 11/03/2018   Procedure: OPEN REDUCTION INTERNAL FIXATION (ORIF) TRIPOD FRACTURE;  Surgeon: Suzanna Obey, MD;  Location: [facility] OR;  Service: ENT;  Laterality: N/A;  ORIF tripod fracture- malar fracture     Home Medications:  Prior to Admission medications   Medication Sig Start Date End Date Taking? Authorizing Provider  aspirin EC 81 MG EC tablet Take 1 tablet (81 mg total) by mouth daily. Patient not taking: Reported on 03/04/2020 09/29/19   Rodolph Bong, MD  atorvastatin (LIPITOR) 20 MG tablet Take 1 tablet (20 mg total) by mouth daily. Patient not taking: Reported on 03/04/2020 10/11/19 01/09/20  Runell Gess, MD  carvedilol (COREG) 25 MG tablet Take 1 tablet (25 mg total) by mouth 2 (two) times daily with a meal. Patient not taking: Reported on 03/04/2020 10/11/19   Runell Gess, MD  folic acid (FOLVITE) 1 MG tablet Take 1 tablet (1 mg total) by mouth daily. Patient not taking: Reported on 03/04/2020 09/29/19   Rodolph Bong, MD  Multiple Vitamin (MULTIVITAMIN  WITH MINERALS) TABS tablet Take 1 tablet by mouth daily. Patient not taking: Reported on 03/04/2020 09/29/19   Rodolph Bong, MD  pantoprazole (PROTONIX) 40 MG tablet Take 1 tablet (40 mg total) by mouth daily. Patient not taking: Reported on 03/04/2020 09/28/19 09/27/20  Rodolph Bong, MD  sacubitril-valsartan (ENTRESTO) 49-51 MG Take 1 tablet by mouth 2 (two) times daily. Patient not taking: Reported on 03/04/2020 09/28/19   Rodolph Bong, MD  spironolactone (ALDACTONE) 25 MG tablet Take 0.5 tablets (12.5 mg total) by mouth daily. Patient  not taking: Reported on 03/04/2020 09/29/19   Rodolph Bong, MD  thiamine 100 MG tablet Take 1 tablet (100 mg total) by mouth daily. Patient not taking: Reported on 03/04/2020 09/29/19   Rodolph Bong, MD    Inpatient Medications: Scheduled Meds: . aspirin EC  81 mg Oral Daily  . atorvastatin  20 mg Oral Daily  . carvedilol  25 mg Oral BID WC  . folic acid  1 mg Oral Daily  . heparin injection (subcutaneous)  5,000 Units Subcutaneous Q8H  . lidocaine  1 patch Transdermal Q24H  . LORazepam  0-4 mg Intravenous Q6H   Or  . LORazepam  0-4 mg Oral Q6H  . [START ON 03/06/2020] LORazepam  0-4 mg Intravenous Q12H   Or  . [START ON 03/06/2020] LORazepam  0-4 mg Oral Q12H  . multivitamin with minerals  1 tablet Oral Daily  . pantoprazole  40 mg Oral Daily  . sacubitril-valsartan  1 tablet Oral BID  . spironolactone  12.5 mg Oral Daily  . thiamine  100 mg Oral Daily   Or  . thiamine  100 mg Intravenous Daily   Continuous Infusions: . sodium chloride Stopped (03/04/20 0619)  . lactated ringers 100 mL/hr at 03/04/20 1739   PRN Meds: sodium chloride  Allergies:    Allergies  Allergen Reactions  . Penicillins Swelling    Did it involve swelling of the face/tongue/throat, SOB, or low BP? Y Did it involve sudden or severe rash/hives, skin peeling, or any reaction on the inside of your mouth or nose? N Did you need to seek medical attention at a hospital or doctor's office? Y When did it last happen? 10 years If all above answers are "NO", may proceed with cephalosporin use.    Social History:   Social History   Socioeconomic History  . Marital status: Married    Spouse name: Not on file  . Number of children: Not on file  . Years of education: Not on file  . Highest education level: Not on file  Occupational History  . Not on file  Tobacco Use  . Smoking status: Current Every Day Smoker    Packs/day: 0.50    Years: 1.00    Pack years: 0.50  . Smokeless tobacco: Never  Used  Vaping Use  . Vaping Use: Never used  Substance and Sexual Activity  . Alcohol use: Yes    Alcohol/week: 48.0 standard drinks    Types: 48 Shots of liquor per week    Comment: 01/05/2018 " 5th of liquor/day;  3 days/wk  . Drug use: Not Currently  . Sexual activity: Not Currently  Other Topics Concern  . Not on file  Social History Narrative  . Not on file   Social Determinants of Health   Financial Resource Strain:   . Difficulty of Paying Living Expenses: Not on file  Food Insecurity:   . Worried About Programme researcher, broadcasting/film/video  in the Last Year: Not on file  . Ran Out of Food in the Last Year: Not on file  Transportation Needs:   . Lack of Transportation (Medical): Not on file  . Lack of Transportation (Non-Medical): Not on file  Physical Activity:   . Days of Exercise per Week: Not on file  . Minutes of Exercise per Session: Not on file  Stress:   . Feeling of Stress : Not on file  Social Connections:   . Frequency of Communication with Friends and Family: Not on file  . Frequency of Social Gatherings with Friends and Family: Not on file  . Attends Religious Services: Not on file  . Active Member of Clubs or Organizations: Not on file  . Attends Banker Meetings: Not on file  . Marital Status: Not on file  Intimate Partner Violence:   . Fear of Current or Ex-Partner: Not on file  . Emotionally Abused: Not on file  . Physically Abused: Not on file  . Sexually Abused: Not on file    Family History:   No cardiac history other than mother.  Family History  Problem Relation Age of Onset  . CAD Mother        MI age 38    ROS:  Please see the history of present illness.  ROS  All other ROS reviewed and negative.     Physical Exam/Data:   Vitals:   03/04/20 1300 03/04/20 1330 03/04/20 1400 03/04/20 1511  BP: (!) 135/98 (!) 131/100 (!) 134/100 (!) 136/109  Pulse:  98 97 96  Resp:  20 (!) 27 20  Temp:    99.2 F (37.3 C)  TempSrc:    Oral  SpO2:   97% 97% 96%  Weight:    94.5 kg  Height:    6\' 2"  (1.88 m)   No intake or output data in the 24 hours ending 03/04/20 1803 Filed Weights   03/03/20 2106 03/04/20 1511  Weight: 102.1 kg 94.5 kg   Body mass index is 26.75 kg/m.   General:  Well nourished, well developed, in no acute distress HEENT: left eye with subconjunctival hemorrhage Lymph: no adenopathy Neck: no JVD near supine Endocrine:  No thryomegaly Vascular: No carotid bruits; distal pulses 2+ bilaterally, no pulsus alternans Cardiac:  normal S1, S2; RRR; no murmur  Lungs:  clear to auscultation bilaterally, no wheezing, rhonchi or rales  Abd: NABS, soft, nontender, no hepatomegaly, no dullness to percussion Ext: trace non pitting edema Musculoskeletal:  No deformities, BUE and BLE strength normal and equal Skin: warm and dry  Neuro:  CNs 2-12 intact, no focal abnormalities noted Psych:  Normal affect   EKG:  The EKG was personally reviewed and demonstrates:  Sinus tachycardia 110, LVH with secondary repolarization; septal infarct pattern (03/04/20 ECG is actually scanned from 03/03/20). Telemetry:  Telemetry was personally reviewed and demonstrates:  Sinus rhythm  Relevant CV Studies:  Personally reviewed 09/25/19 Echo:  EF 25%, Mild AI, moderate to severe LV dilation.   1. Left ventricular ejection fraction, by estimation, is 25 to 30%. The  left ventricle has severely decreased function. The left ventricle  demonstrates global hypokinesis. The left ventricular internal cavity size  was moderately dilated. Left  ventricular diastolic parameters are consistent with Grade II diastolic  dysfunction (pseudonormalization). Elevated left ventricular end-diastolic  pressure. There is severe hypokinesis of the left ventricular, entire  anteroseptal wall. There is severe  akinesis of the left ventricular, mid anteroseptal wall.  2. Right ventricular systolic function is mildly reduced. The right  ventricular size is  normal.  3. The mitral valve is grossly normal. Mild mitral valve regurgitation.  No evidence of mitral stenosis.  4. The aortic valve is normal in structure. Aortic valve regurgitation is  mild. No aortic stenosis is present.   LHC 09/25/19: Widely Patent Coronary Arteries Ramus Intermedius is suggested  Laboratory Data:  Novel Troponin- 29-> 40  Chemistry Recent Labs  Lab 03/03/20 2119  NA 138  K 3.0*  CL 98  CO2 25  GLUCOSE 119*  BUN 7  CREATININE 1.05  CALCIUM 9.1  GFRNONAA >60  ANIONGAP 15    Recent Labs  Lab 03/03/20 2119  PROT 9.1*  ALBUMIN 4.5  AST 222*  ALT 137*  ALKPHOS 117  BILITOT 2.7*   Hematology Recent Labs  Lab 03/03/20 2119  WBC 13.1*  RBC 5.19  HGB 17.1*  HCT 50.1  MCV 96.5  MCH 32.9  MCHC 34.1  RDW 14.7  PLT 247   Cardiac Enzymes  BNP Recent Labs  Lab 03/03/20 2119  BNP 350.0*    Radiology/Studies:  DG Chest 2 View  Result Date: 03/03/2020 CLINICAL DATA:  Left-sided chest pain. EXAM: CHEST - 2 VIEW COMPARISON:  Sep 24, 2019 FINDINGS: The heart size and mediastinal contours are within normal limits. Both lungs are clear. The visualized skeletal structures are unremarkable. IMPRESSION: No active cardiopulmonary disease. Electronically Signed   By: Aram Candela M.D.   On: 03/03/2020 21:19   CT Angio Chest/Abd/Pel for Dissection W and/or Wo Contrast  Result Date: 03/03/2020 CLINICAL DATA:  Left-sided chest pain. EXAM: CT ANGIOGRAPHY CHEST, ABDOMEN AND PELVIS TECHNIQUE: Non-contrast CT of the chest was initially obtained. Multidetector CT imaging through the chest, abdomen and pelvis was performed using the standard protocol during bolus administration of intravenous contrast. Multiplanar reconstructed images and MIPs were obtained and reviewed to evaluate the vascular anatomy. CONTRAST:  OMNIPAQUE IOHEXOL 350 MG/ML SOLN COMPARISON:  Sep 24, 2019 FINDINGS: CTA CHEST FINDINGS Cardiovascular: The thoracic aorta is normal in  appearance, without evidence of aneurysmal dilatation or dissection. Satisfactory opacification of the pulmonary arteries to the segmental level. No evidence of pulmonary embolism. Normal heart size. No pericardial effusion. Mediastinum/Nodes: No enlarged mediastinal, hilar, or axillary lymph nodes. Thyroid gland, trachea, and esophagus demonstrate no significant findings. Lungs/Pleura: Lungs are clear. No pleural effusion or pneumothorax. Musculoskeletal: No chest wall abnormality. No acute or significant osseous findings. Review of the MIP images confirms the above findings. CTA ABDOMEN AND PELVIS FINDINGS VASCULAR Aorta: Normal caliber aorta without aneurysm, dissection, vasculitis or significant stenosis. Celiac: Patent without evidence of aneurysm, dissection, vasculitis or significant stenosis. SMA: Patent without evidence of aneurysm, dissection, vasculitis or significant stenosis. Renals: Both renal arteries are patent without evidence of aneurysm, dissection, vasculitis, fibromuscular dysplasia or significant stenosis. IMA: Patent without evidence of aneurysm, dissection, vasculitis or significant stenosis. Inflow: Patent without evidence of aneurysm, dissection, vasculitis or significant stenosis. Veins: No obvious venous abnormality within the limitations of this arterial phase study. Review of the MIP images confirms the above findings. NON-VASCULAR Hepatobiliary: There is diffuse fatty infiltration of the liver parenchyma. No focal liver abnormality is seen. No gallstones, gallbladder wall thickening, or biliary dilatation. Pancreas: Unremarkable. No pancreatic ductal dilatation or surrounding inflammatory changes. Spleen: Normal in size without focal abnormality. Adrenals/Urinary Tract: Adrenal glands are unremarkable. Kidneys are normal, without renal calculi, focal lesion, or hydronephrosis. The urinary bladder is contracted and subsequently limited in evaluation.  Stomach/Bowel: Stomach is within  normal limits. The appendix is not clearly identified. No evidence of bowel wall thickening, distention, or inflammatory changes. Noninflamed diverticula are seen within the sigmoid colon. Lymphatic: No abnormal abdominal or pelvic lymph nodes are identified. Reproductive: Prostate is unremarkable. Other: No abdominal wall hernia or abnormality. No abdominopelvic ascites. Musculoskeletal: No acute or significant osseous findings. Review of the MIP images confirms the above findings. IMPRESSION: 1. No evidence of aortic dissection or aneurysm. 2. No evidence of pulmonary embolism. 3. Hepatic steatosis. 4. Sigmoid diverticulosis. Electronically Signed   By: Aram Candela M.D.   On: 03/03/2020 22:37    Assessment and Plan:   Chronic Systolic Heart Failure Alcohol Abuse Tobacco Abuse  HTN with HTN Emergency (clarification) Social Determinants of Health:  Unable to afford medications - NYHA class II, Stage C, euvolemic, etiology from alcohol highest on ddx - Strict I/Os, daily weights, and fluid restriction of < 2 L  - Replace electrolytes PRN and keep K>4 and Mg>2. - Daily BMP, Mg. - Continue Coreg (new restart)  - Continue Entresto (new restart) - Continue aldactone 12.5 mg and would increase to 25 mg if hypokalemia or if stable kidney fuction - Will follow up TTE.  - Consider outpatient SGLT2i  - will defer ICD eval at this time given that he had to stop his medications - continue telemetry - If HTN urgency issues overnight, can start hydralazine 10 mg BID (and can increase to 25 mg if necessary) - Discussed substance abuse and medication strategies at length; will need case worker support to keep his medications/assistw with medication plan  Addendum: Aortic Atherosclerosis HLD Hepatitic steatosis -  Trend LFTs -  When OK per primary (when alcohol abuse is off ddx and his baseline hepatic steatosis is only etiology for transamnitis) would return high intensity statin  For questions  or updates, please contact [facility] Please consult [email or web address] for contact info under Cardiology/STEMI.

## 2020-03-04 NOTE — Progress Notes (Signed)
Patient arrived via carelink in NAD. VS stable. Admission MD paged for admission orders. Cell phone, clothing and toiletry bad at bedside and patient advised about personal belongings policy and visitor policy. Patient oriented to room and call bell in reach.

## 2020-03-04 NOTE — H&P (Addendum)
History and Physical   DEV BILDERBACK KXF:[id number] DOB: 03-03-1976 DOA: 03/03/2020  PCP: Patient, No Pcp Per  Patient coming from: home  I have personally briefly reviewed patient's old medical records in [facility] EMR.  Chief Concern: chest pain  HPI: Derrick Hoffman is a 45 y.o. male with medical history significant for hypertension, medication noncompliance, alcohol abuse, hypertensive urgency, NSTEMI, systolic heart failure, alcoholic cardiomyopathy, presented to the hospital for chief concern of chest pain.  He reports the chest pain started at approximately 6 PM on 03/04/2019.  He states the pain is sharp, persistent, 6 out of 10, no radiation.  He endorses associated shortness of breath, nausea and vomiting on his way to the emergency department yesterday, 03/03/2020.  Reports that the pain is similar to his presentation for when he came in last May. He reports he has not been able to obtain his blood pressure and heart medications for months due to inability to afford the medications.  Review of system was negative for current headache, vision changes, abdominal pain, dysuria, hematuria, current nausea, current vomiting, SI, HI, insomnia, back pain.  ED Course: Reviewed ED course notes from [facility].  ED provider requested admission for hypertensive urgency/emergency.  He was given labetalol and Ativan in the emergency department with reported improvement in symptoms.  Review of Systems: As per HPI otherwise 10 point review of systems negative.  Past Medical History:  Diagnosis Date  . Abnormal liver function test   . Anxiety   . Aortic atherosclerosis (HCC)   . Childhood asthma   . Habitual alcohol use   . Hepatic steatosis   . History of kidney stones 1990  . Hypertension   . Hypokalemia   . Tobacco abuse    Past Surgical History:  Procedure Laterality Date  . ANTERIOR CRUCIATE LIGAMENT REPAIR Right 1996  . APPENDECTOMY  01/05/2018  . BUNIONECTOMY  Left 2013  . LAPAROSCOPIC APPENDECTOMY N/A 01/05/2018   Procedure: APPENDECTOMY LAPAROSCOPIC;  Surgeon: Griselda Miner, MD;  Location: [facility] OR;  Service: General;  Laterality: N/A;  . LEFT HEART CATH AND CORONARY ANGIOGRAPHY N/A 09/25/2019   Procedure: LEFT HEART CATH AND CORONARY ANGIOGRAPHY;  Surgeon: Runell Gess, MD;  Location: [facility] INVASIVE CV LAB;  Service: Cardiovascular;  Laterality: N/A;  . ORIF TRIPOD FRACTURE N/A 11/03/2018   Procedure: OPEN REDUCTION INTERNAL FIXATION (ORIF) TRIPOD FRACTURE;  Surgeon: Suzanna Obey, MD;  Location: [facility] OR;  Service: ENT;  Laterality: N/A;  ORIF tripod fracture- malar fracture   Social History:  reports that he has been smoking. He has a 0.50 pack-year smoking history. He has never used smokeless tobacco. He reports current alcohol use of about 48.0 standard drinks of alcohol per week. He reports previous drug use.  Allergies  Allergen Reactions  . Penicillins Swelling    Did it involve swelling of the face/tongue/throat, SOB, or low BP? Y Did it involve sudden or severe rash/hives, skin peeling, or any reaction on the inside of your mouth or nose? N Did you need to seek medical attention at a hospital or doctor's office? Y When did it last happen? 10 years If all above answers are "NO", may proceed with cephalosporin use.   Family History  Problem Relation Age of Onset  . CAD Mother        MI age 71   Family history: Family history reviewed and MI in mother at age 43.  Prior to Admission medications   Medication Sig Start  Date End Date Taking? Authorizing Provider  aspirin EC 81 MG EC tablet Take 1 tablet (81 mg total) by mouth daily. Patient not taking: Reported on 03/04/2020 09/29/19   Rodolph Bong, MD  atorvastatin (LIPITOR) 20 MG tablet Take 1 tablet (20 mg total) by mouth daily. Patient not taking: Reported on 03/04/2020 10/11/19 01/09/20  Runell Gess, MD  carvedilol (COREG) 25 MG tablet Take 1 tablet (25 mg total) by mouth 2 (two)  times daily with a meal. Patient not taking: Reported on 03/04/2020 10/11/19   Runell Gess, MD  folic acid (FOLVITE) 1 MG tablet Take 1 tablet (1 mg total) by mouth daily. Patient not taking: Reported on 03/04/2020 09/29/19   Rodolph Bong, MD  Multiple Vitamin (MULTIVITAMIN WITH MINERALS) TABS tablet Take 1 tablet by mouth daily. Patient not taking: Reported on 03/04/2020 09/29/19   Rodolph Bong, MD  pantoprazole (PROTONIX) 40 MG tablet Take 1 tablet (40 mg total) by mouth daily. Patient not taking: Reported on 03/04/2020 09/28/19 09/27/20  Rodolph Bong, MD  sacubitril-valsartan (ENTRESTO) 49-51 MG Take 1 tablet by mouth 2 (two) times daily. Patient not taking: Reported on 03/04/2020 09/28/19   Rodolph Bong, MD  spironolactone (ALDACTONE) 25 MG tablet Take 0.5 tablets (12.5 mg total) by mouth daily. Patient not taking: Reported on 03/04/2020 09/29/19   Rodolph Bong, MD  thiamine 100 MG tablet Take 1 tablet (100 mg total) by mouth daily. Patient not taking: Reported on 03/04/2020 09/29/19   Rodolph Bong, MD   Physical Exam: Vitals:   03/04/20 1330 03/04/20 1400 03/04/20 1511 03/04/20 2002  BP: (!) 131/100 (!) 134/100 (!) 136/109 107/84  Pulse: 98 97 96 94  Resp: 20 (!) 27 20 16   Temp:   99.2 F (37.3 C) 98.7 F (37.1 C)  TempSrc:   Oral Oral  SpO2: 97% 97% 96% 97%  Weight:   94.5 kg   Height:   6\' 2"  (1.88 m)    Constitutional: NAD, calm, comfortable Eyes: PERRL, lids bilateral. Injected conjunctiva in left sclera. Normal conjunctiva on right sclarea. ENMT: Mucous membranes are moist. Posterior pharynx clear of any exudate or lesions. Normal dentition.  Neck: normal, supple, no masses, no thyromegaly Respiratory: clear to auscultation bilaterally, no wheezing, no crackles. Normal respiratory effort. No accessory muscle use.  Cardiovascular: Regular rate and rhythm, no murmurs / rubs / gallops. No extremity edema. 2+ pedal pulses. No carotid bruits.    Abdomen: no tenderness, no masses palpated. No hepatosplenomegaly. Bowel sounds positive.  Musculoskeletal: no clubbing / cyanosis. No joint deformity upper and lower extremities. Good ROM, no contractures. Normal muscle tone.  Skin: no rashes, lesions, ulcers. No induration Neurologic: CN 2-12 grossly intact. Sensation intact. Strength 5/5 in all 4.  Psychiatric: Normal judgment and insight. Alert and oriented x 3. Normal mood.   Labs on Admission: I have personally reviewed following labs and imaging studies  CBC: Recent Labs  Lab 03/03/20 2119  WBC 13.1*  HGB 17.1*  HCT 50.1  MCV 96.5  PLT 247   Basic Metabolic Panel: Recent Labs  Lab 03/03/20 2119 03/04/20 1752 03/04/20 1904  NA 138  --   --   K 3.0*  --  3.6  CL 98  --   --   CO2 25  --   --   GLUCOSE 119*  --   --   BUN 7  --   --   CREATININE 1.05  --   --  CALCIUM 9.1  --   --   PHOS  --  2.5  --    GFR: Estimated Creatinine Clearance: 105.5 mL/min (by C-G formula based on SCr of 1.05 mg/dL). Liver Function Tests: Recent Labs  Lab 03/03/20 2119  AST 222*  ALT 137*  ALKPHOS 117  BILITOT 2.7*  PROT 9.1*  ALBUMIN 4.5   Recent Labs  Lab 03/03/20 2119  LIPASE 33   Urine analysis:    Component Value Date/Time   COLORURINE YELLOW 09/25/2019 1300   APPEARANCEUR CLEAR 09/25/2019 1300   LABSPEC 1.011 09/25/2019 1300   PHURINE 8.0 09/25/2019 1300   GLUCOSEU NEGATIVE 09/25/2019 1300   HGBUR NEGATIVE 09/25/2019 1300   BILIRUBINUR NEGATIVE 09/25/2019 1300   KETONESUR 20 (A) 09/25/2019 1300   PROTEINUR NEGATIVE 09/25/2019 1300   NITRITE NEGATIVE 09/25/2019 1300   LEUKOCYTESUR NEGATIVE 09/25/2019 1300   Radiological Exams on Admission: Personally reviewed and I agree with radiologist reading as below.  DG Chest 2 View  Result Date: 03/03/2020 CLINICAL DATA:  Left-sided chest pain. EXAM: CHEST - 2 VIEW COMPARISON:  Sep 24, 2019 FINDINGS: The heart size and mediastinal contours are within normal  limits. Both lungs are clear. The visualized skeletal structures are unremarkable. IMPRESSION: No active cardiopulmonary disease. Electronically Signed   By: Aram Candela M.D.   On: 03/03/2020 21:19   CT Angio Chest/Abd/Pel for Dissection W and/or Wo Contrast  Result Date: 03/03/2020 CLINICAL DATA:  Left-sided chest pain. EXAM: CT ANGIOGRAPHY CHEST, ABDOMEN AND PELVIS TECHNIQUE: Non-contrast CT of the chest was initially obtained. Multidetector CT imaging through the chest, abdomen and pelvis was performed using the standard protocol during bolus administration of intravenous contrast. Multiplanar reconstructed images and MIPs were obtained and reviewed to evaluate the vascular anatomy. CONTRAST:  OMNIPAQUE IOHEXOL 350 MG/ML SOLN COMPARISON:  Sep 24, 2019 FINDINGS: CTA CHEST FINDINGS Cardiovascular: The thoracic aorta is normal in appearance, without evidence of aneurysmal dilatation or dissection. Satisfactory opacification of the pulmonary arteries to the segmental level. No evidence of pulmonary embolism. Normal heart size. No pericardial effusion. Mediastinum/Nodes: No enlarged mediastinal, hilar, or axillary lymph nodes. Thyroid gland, trachea, and esophagus demonstrate no significant findings. Lungs/Pleura: Lungs are clear. No pleural effusion or pneumothorax. Musculoskeletal: No chest wall abnormality. No acute or significant osseous findings. Review of the MIP images confirms the above findings. CTA ABDOMEN AND PELVIS FINDINGS VASCULAR Aorta: Normal caliber aorta without aneurysm, dissection, vasculitis or significant stenosis. Celiac: Patent without evidence of aneurysm, dissection, vasculitis or significant stenosis. SMA: Patent without evidence of aneurysm, dissection, vasculitis or significant stenosis. Renals: Both renal arteries are patent without evidence of aneurysm, dissection, vasculitis, fibromuscular dysplasia or significant stenosis. IMA: Patent without evidence of aneurysm,  dissection, vasculitis or significant stenosis. Inflow: Patent without evidence of aneurysm, dissection, vasculitis or significant stenosis. Veins: No obvious venous abnormality within the limitations of this arterial phase study. Review of the MIP images confirms the above findings. NON-VASCULAR Hepatobiliary: There is diffuse fatty infiltration of the liver parenchyma. No focal liver abnormality is seen. No gallstones, gallbladder wall thickening, or biliary dilatation. Pancreas: Unremarkable. No pancreatic ductal dilatation or surrounding inflammatory changes. Spleen: Normal in size without focal abnormality. Adrenals/Urinary Tract: Adrenal glands are unremarkable. Kidneys are normal, without renal calculi, focal lesion, or hydronephrosis. The urinary bladder is contracted and subsequently limited in evaluation. Stomach/Bowel: Stomach is within normal limits. The appendix is not clearly identified. No evidence of bowel wall thickening, distention, or inflammatory changes. Noninflamed diverticula are seen within the  sigmoid colon. Lymphatic: No abnormal abdominal or pelvic lymph nodes are identified. Reproductive: Prostate is unremarkable. Other: No abdominal wall hernia or abnormality. No abdominopelvic ascites. Musculoskeletal: No acute or significant osseous findings. Review of the MIP images confirms the above findings. IMPRESSION: 1. No evidence of aortic dissection or aneurysm. 2. No evidence of pulmonary embolism. 3. Hepatic steatosis. 4. Sigmoid diverticulosis. Electronically Signed   By: Aram Candela M.D.   On: 03/03/2020 22:37   EKG: Independently reviewed, showing sinus rhythm with prolonged QT and LVH.  Assessment/Plan  Active Problems:   Chest pain   NICM (nonischemic cardiomyopathy) (HCC)   Elevated troponin   Alcohol abuse   Leukocytosis   Hypokalemia   Hypertensive emergency   Hyperlipidemia   Chest pain-persistent in nature, low clinical suspicion for ACS -Coronary  catheterization in May 2021 showed clean coronary arteries -Elevated high-sensitivity troponins with positive delta, we will continue to recycle -Cardiology has been consulted and is aware -Echo -Cardiac diet -Lidocaine patch  Hypertensive emergency-patient reports that he has not been taking his blood pressure medications for several months due to inability to afford medications -Transition of care manager consult -Cardiology states he will give patient pharmaceutical cards to better afford Entresto  Hypokalemia-replace, BMP in the a.m.  Alcohol abuse-last drink per patient was Friday, 03/01/2020 -Extensive counseling given regarding cessation of alcohol given patient has diagnosis of nonischemic cardiomyopathy -Patient endorses understanding and compliance  Leukocytosis-suspect volume contraction -Lactated Ringer's IVF -CBC in the a.m.  Elevated LFTs-AST/ALT ratio consistent with chronic alcohol use, -No right upper quadrant abdominal pain at this time  Nausea vomiting-no antinausea medication at this time due to prolonged QT  Prolonged QT-continue cardiac with telemetry monitoring -Repeat EKG has been ordered  DVT prophylaxis: Heparin subcu Code Status: Full code Diet: Cardiac Family Communication: Patient wanted his girlfriend to be notified and his girlfriend was on video conferencing during physical exam Disposition Plan: Pending clinical course Consults called: Cardiology, Dr. Izora Ribas has been called regarding patient Admission status: Observation with telemetry cardiac unit  Casimer Russett N Laquida Cotrell D.O. [facility]  If 7AM-7PM, please contact day-coverage provider [email or web address]  03/04/2020, 8:29 PM

## 2020-03-04 NOTE — ED Notes (Signed)
Checked with [facility].. they are aware that pt still needs bed

## 2020-03-04 NOTE — ED Notes (Signed)
[facility] here to get pt and take him to [facility]

## 2020-03-05 ENCOUNTER — Observation Stay (HOSPITAL_BASED_OUTPATIENT_CLINIC_OR_DEPARTMENT_OTHER): Payer: Self-pay

## 2020-03-05 ENCOUNTER — Other Ambulatory Visit (HOSPITAL_COMMUNITY): Payer: Self-pay | Admitting: Internal Medicine

## 2020-03-05 DIAGNOSIS — E7801 Familial hypercholesterolemia: Secondary | ICD-10-CM

## 2020-03-05 DIAGNOSIS — I340 Nonrheumatic mitral (valve) insufficiency: Secondary | ICD-10-CM

## 2020-03-05 DIAGNOSIS — R079 Chest pain, unspecified: Secondary | ICD-10-CM

## 2020-03-05 DIAGNOSIS — I428 Other cardiomyopathies: Secondary | ICD-10-CM

## 2020-03-05 LAB — COMPREHENSIVE METABOLIC PANEL
ALT: 78 U/L — ABNORMAL HIGH (ref 0–44)
AST: 81 U/L — ABNORMAL HIGH (ref 15–41)
Albumin: 3.4 g/dL — ABNORMAL LOW (ref 3.5–5.0)
Alkaline Phosphatase: 85 U/L (ref 38–126)
Anion gap: 11 (ref 5–15)
BUN: 10 mg/dL (ref 6–20)
CO2: 25 mmol/L (ref 22–32)
Calcium: 8.7 mg/dL — ABNORMAL LOW (ref 8.9–10.3)
Chloride: 102 mmol/L (ref 98–111)
Creatinine, Ser: 1.06 mg/dL (ref 0.61–1.24)
GFR, Estimated: >60 mL/min (ref >60)
Glucose, Bld: 102 mg/dL — ABNORMAL HIGH (ref 70–99)
Potassium: 3.4 mmol/L — ABNORMAL LOW (ref 3.5–5.1)
Sodium: 138 mmol/L (ref 135–145)
Total Bilirubin: 3.9 mg/dL — ABNORMAL HIGH (ref 0.3–1.2)
Total Protein: 7.1 g/dL (ref 6.5–8.1)

## 2020-03-05 LAB — CBC
HCT: 48 % (ref 39.0–52.0)
Hemoglobin: 16 g/dL (ref 13.0–17.0)
MCH: 33 pg (ref 26.0–34.0)
MCHC: 33.3 g/dL (ref 30.0–36.0)
MCV: 99 fL (ref 80.0–100.0)
Platelets: 232 10*3/uL (ref 150–400)
RBC: 4.85 MIL/uL (ref 4.22–5.81)
RDW: 14.9 % (ref 11.5–15.5)
WBC: 10.2 10*3/uL (ref 4.0–10.5)
nRBC: 0 % (ref 0.0–0.2)

## 2020-03-05 LAB — ECHOCARDIOGRAM COMPLETE
Area-P 1/2: 4.21 cm2
Height: 74 in
S' Lateral: 5 cm
Weight: 3395.2 oz

## 2020-03-05 LAB — MAGNESIUM: Magnesium: 1.4 mg/dL — ABNORMAL LOW (ref 1.7–2.4)

## 2020-03-05 MED ORDER — SPIRONOLACTONE 25 MG PO TABS
12.5000 mg | ORAL_TABLET | Freq: Every day | ORAL | 1 refills | Status: DC
Start: 2020-03-05 — End: 2020-03-05

## 2020-03-05 MED ORDER — FOLIC ACID 1 MG PO TABS
1.0000 mg | ORAL_TABLET | Freq: Every day | ORAL | 1 refills | Status: DC
Start: 2020-03-05 — End: 2020-03-05

## 2020-03-05 MED ORDER — ADULT MULTIVITAMIN W/MINERALS CH: 1.0000 | ORAL_TABLET | Freq: Every day | ORAL | 1 refills | Status: AC

## 2020-03-05 MED ORDER — PANTOPRAZOLE SODIUM 40 MG PO TBEC: 40.0000 mg | DELAYED_RELEASE_TABLET | Freq: Every day | ORAL | 1 refills | Status: DC

## 2020-03-05 MED ORDER — SACUBITRIL-VALSARTAN 49-51 MG PO TABS: 1.0000 | ORAL_TABLET | Freq: Two times a day (BID) | ORAL | 1 refills | Status: DC

## 2020-03-05 MED ORDER — ADULT MULTIVITAMIN W/MINERALS CH
1.0000 | ORAL_TABLET | Freq: Every day | ORAL | 1 refills | Status: DC
Start: 2020-03-05 — End: 2020-03-05

## 2020-03-05 MED ORDER — THIAMINE HCL 100 MG PO TABS
100.0000 mg | ORAL_TABLET | Freq: Every day | ORAL | 1 refills | Status: DC
Start: 2020-03-05 — End: 2020-03-05

## 2020-03-05 MED ORDER — CARVEDILOL 25 MG PO TABS
25.0000 mg | ORAL_TABLET | Freq: Two times a day (BID) | ORAL | 1 refills | Status: DC
Start: 2020-03-05 — End: 2020-03-05

## 2020-03-05 MED ORDER — ASPIRIN 81 MG PO TBEC
81.0000 mg | DELAYED_RELEASE_TABLET | Freq: Every day | ORAL | 1 refills | Status: DC
Start: 2020-03-05 — End: 2020-04-04

## 2020-03-05 MED ORDER — POTASSIUM CHLORIDE CRYS ER 20 MEQ PO TBCR
40.0000 meq | EXTENDED_RELEASE_TABLET | Freq: Once | ORAL | Status: AC
  Administered 2020-03-05: 40 meq via ORAL
  Filled 2020-03-05: qty 2

## 2020-03-05 MED ORDER — ATORVASTATIN CALCIUM 20 MG PO TABS: 20.0000 mg | ORAL_TABLET | Freq: Every day | ORAL | 3 refills | Status: DC

## 2020-03-05 MED ORDER — SPIRONOLACTONE 25 MG PO TABS
25.0000 mg | ORAL_TABLET | Freq: Every day | ORAL | Status: DC
  Administered 2020-03-05: 25 mg via ORAL
  Filled 2020-03-05: qty 1

## 2020-03-05 MED ORDER — SACUBITRIL-VALSARTAN 49-51 MG PO TABS
1.0000 | ORAL_TABLET | Freq: Two times a day (BID) | ORAL | 1 refills | Status: DC
Start: 2020-03-05 — End: 2020-03-05

## 2020-03-05 MED ORDER — SPIRONOLACTONE 25 MG PO TABS
12.5000 mg | ORAL_TABLET | Freq: Every day | ORAL | 1 refills | Status: DC
Start: 2020-03-05 — End: 2020-03-14

## 2020-03-05 MED ORDER — CARVEDILOL 25 MG PO TABS
25.0000 mg | ORAL_TABLET | Freq: Two times a day (BID) | ORAL | 1 refills | Status: DC
Start: 2020-03-05 — End: 2020-03-14

## 2020-03-05 MED ORDER — THIAMINE HCL 100 MG PO TABS: 100.0000 mg | ORAL_TABLET | Freq: Every day | ORAL | 1 refills | Status: AC

## 2020-03-05 NOTE — Progress Notes (Signed)
  Echocardiogram 2D Echocardiogram has been performed.  Lilyanne Mcquown G Darnelle Derrick 03/05/2020, 11:19 AM

## 2020-03-05 NOTE — Discharge Instructions (Signed)
You were cared for by a hospitalist during your hospital stay. If you have any questions about your discharge medications or the care you received while you were in the hospital after you are discharged, you can call the unit and asked to speak with the hospitalist on call if the hospitalist that took care of you is not available. Once you are discharged, your primary care physician will handle any further medical issues.   Please note that NO REFILLS for any discharge medications will be authorized once you are discharged, as it is imperative that you return to your primary care physician (or establish a relationship with a primary care physician if you do not have one) for your aftercare needs so that they can reassess your need for medications and monitor your lab values.  Please take all your medications with you for your next visit with your Primary MD. Please ask your Primary MD to get all Hospital records sent to his/her office. Please request your Primary MD to go over all hospital test results at the follow up.   If you experience worsening of your admission symptoms, develop shortness of breath, chest pain, suicidal or homicidal thoughts or a life threatening emergency, you must seek medical attention immediately by calling 911 or calling your MD.   Derrick Hoffman must read the complete instructions/literature along with all the possible adverse reactions/side effects for all the medicines you take including new medications that have been prescribed to you. Take new medicines after you have completely understood and accpet all the possible adverse reactions/side effects.    Do not drive when taking pain medications or sedatives.     Do not take more than prescribed Pain, Sleep and Anxiety Medications   If you have smoked or chewed Tobacco in the last 2 yrs please stop. Stop any regular alcohol  and or recreational drug use.   Wear Seat belts while driving.   Heart Failure, Self Care Heart  failure is a serious condition. This sheet explains things you need to do to take care of yourself at home. To help you stay as healthy as possible, you may be asked to change your diet, take certain medicines, and make other changes in your life. Your doctor may also give you more specific instructions. If you have problems or questions, call your doctor. What are the risks? Having heart failure makes it more likely for you to have some problems. These problems can get worse if you do not take good care of yourself. Problems may include:  Blood clotting problems. This may cause a stroke.  Damage to the kidneys, liver, or lungs.  Abnormal heart rhythms. Supplies needed:  Scale for weighing yourself.  Blood pressure monitor.  Notebook.  Medicines. How to care for yourself when you have heart failure Medicines Take over-the-counter and prescription medicines only as told by your doctor. Take your medicines every day.  Do not stop taking your medicine unless your doctor tells you to do so.  Do not skip any medicines.  Get your prescriptions refilled before you run out of medicine. This is important. Eating and drinking   Eat heart-healthy foods. Talk with a diet specialist (dietitian) to create an eating plan.  Choose foods that: ? Have no trans fat. ? Are low in saturated fat and cholesterol.  Choose healthy foods, such as: ? Fresh or frozen fruits and vegetables. ? Fish. ? Low-fat (lean) meats. ? Legumes, such as beans, peas, and lentils. ? Fat-free or low-fat dairy  products. ? Whole-grain foods. ? High-fiber foods.  Limit salt (sodium) if told by your doctor. Ask your diet specialist to tell you which seasonings are healthy for your heart.  Cook in healthy ways instead of frying. Healthy ways of cooking include roasting, grilling, broiling, baking, poaching, steaming, and stir-frying.  Limit how much fluid you drink, if told by your doctor. Alcohol use  Do not  drink alcohol if: ? Your doctor tells you not to drink. ? Your heart was damaged by alcohol, or you have very bad heart failure. ? You are pregnant, may be pregnant, or are planning to become pregnant.  If you drink alcohol: ? Limit how much you use to:  0-1 drink a day for women.  0-2 drinks a day for men. ? Be aware of how much alcohol is in your drink. In the [place] one drink equals one 12 oz bottle of beer (355 mL), one 5 oz glass of wine (148 mL), or one 1 oz glass of hard liquor (44 mL). Lifestyle   Do not use any products that contain nicotine or tobacco, such as cigarettes, e-cigarettes, and chewing tobacco. If you need help quitting, ask your doctor. ? Do not use nicotine gum or patches before talking to your doctor.  Do not use illegal drugs.  Lose weight if told by your doctor.  Do physical activity if told by your doctor. Talk to your doctor before you begin an exercise if: ? You are an older adult. ? You have very bad heart failure.  Learn to manage stress. If you need help, ask your doctor.  Get rehab (rehabilitation) to help you stay independent and to help with your quality of life.  Plan time to rest when you get tired. Check weight and blood pressure   Weigh yourself every day. This will help you to know if fluid is building up in your body. ? Weigh yourself every morning after you pee (urinate) and before you eat breakfast. ? Wear the same amount of clothing each time. ? Write down your daily weight. Give your record to your doctor.  Check and write down your blood pressure as told by your doctor.  Check your pulse as told by your doctor. Dealing with very hot and very cold weather  If it is very hot: ? Avoid activities that take a lot of energy. ? Use air conditioning or fans, or find a cooler place. ? Avoid caffeine and alcohol. ? Wear clothing that is loose-fitting, lightweight, and light-colored.  If it is very cold: ? Avoid activities that  take a lot of energy. ? Layer your clothes. ? Wear mittens or gloves, a hat, and a scarf when you go outside. ? Avoid alcohol. Follow these instructions at home:  Stay up to date with shots (vaccines). Get pneumococcal and flu (influenza) shots.  Keep all follow-up visits as told by your doctor. This is important. Contact a doctor if:  You gain weight quickly.  You have increasing shortness of breath.  You cannot do your normal activities.  You get tired easily.  You cough a lot.  You don't feel like eating or feel like you may vomit (nauseous).  You become puffy (swell) in your hands, feet, ankles, or belly (abdomen).  You cannot sleep well because it is hard to breathe.  You feel like your heart is beating fast (palpitations).  You get dizzy when you stand up. Get help right away if:  You have trouble breathing.  You  or someone else notices a change in your behavior, such as having trouble staying awake.  You have chest pain or discomfort.  You pass out (faint). These symptoms may be an emergency. Do not wait to see if the symptoms will go away. Get medical help right away. Call your local emergency services (911 in the [place] Do not drive yourself to the hospital. Summary  Heart failure is a serious condition. To care for yourself, you may have to change your diet, take medicines, and make other lifestyle changes.  Take your medicines every day. Do not stop taking them unless your doctor tells you to do so.  Eat heart-healthy foods, such as fresh or frozen fruits and vegetables, fish, lean meats, legumes, fat-free or low-fat dairy products, and whole-grain or high-fiber foods.  Ask your doctor if you can drink alcohol. You may have to stop alcohol use if you have very bad heart failure.  Contact your doctor if you gain weight quickly or feel that your heart is beating too fast. Get help right away if you pass out, or have chest pain or trouble breathing. This  information is not intended to replace advice given to you by your health care provider. Make sure you discuss any questions you have with your health care provider. Document Revised: 08/02/2018 Document Reviewed: 08/03/2018 Elsevier Patient Education  2020 [employer]  Low-Sodium Eating Plan Sodium, which is an element that makes up salt, helps you maintain a healthy balance of fluids in your body. Too much sodium can increase your blood pressure and cause fluid and waste to be held in your body. Your health care provider or dietitian may recommend following this plan if you have high blood pressure (hypertension), kidney disease, liver disease, or heart failure. Eating less sodium can help lower your blood pressure, reduce swelling, and protect your heart, liver, and kidneys. What are tips for following this plan? General guidelines  Most people on this plan should limit their sodium intake to 1,500-2,000 mg (milligrams) of sodium each day. Reading food labels   The Nutrition Facts label lists the amount of sodium in one serving of the food. If you eat more than one serving, you must multiply the listed amount of sodium by the number of servings.  Choose foods with less than 140 mg of sodium per serving.  Avoid foods with 300 mg of sodium or more per serving. Shopping  Look for lower-sodium products, often labeled as "low-sodium" or "no salt added."  Always check the sodium content even if foods are labeled as "unsalted" or "no salt added".  Buy fresh foods. ? Avoid canned foods and premade or frozen meals. ? Avoid canned, cured, or processed meats  Buy breads that have less than 80 mg of sodium per slice. Cooking  Eat more home-cooked food and less restaurant, buffet, and fast food.  Avoid adding salt when cooking. Use salt-free seasonings or herbs instead of table salt or sea salt. Check with your health care provider or pharmacist before using salt substitutes.  Cook with  plant-based oils, such as canola, sunflower, or olive oil. Meal planning  When eating at a restaurant, ask that your food be prepared with less salt or no salt, if possible.  Avoid foods that contain MSG (monosodium glutamate). MSG is sometimes added to [place] food, bouillon, and some canned foods. What foods are recommended? The items listed may not be a complete list. Talk with your dietitian about what dietary choices are best for you.  Grains Low-sodium cereals, including oats, puffed wheat and rice, and shredded wheat. Low-sodium crackers. Unsalted rice. Unsalted pasta. Low-sodium bread. Whole-grain breads and whole-grain pasta. Vegetables Fresh or frozen vegetables. "No salt added" canned vegetables. "No salt added" tomato sauce and paste. Low-sodium or reduced-sodium tomato and vegetable juice. Fruits Fresh, frozen, or canned fruit. Fruit juice. Meats and other protein foods Fresh or frozen (no salt added) meat, poultry, seafood, and fish. Low-sodium canned tuna and salmon. Unsalted nuts. Dried peas, beans, and lentils without added salt. Unsalted canned beans. Eggs. Unsalted nut butters. Dairy Milk. Soy milk. Cheese that is naturally low in sodium, such as ricotta cheese, fresh mozzarella, or Swiss cheese Low-sodium or reduced-sodium cheese. Cream cheese. Yogurt. Fats and oils Unsalted butter. Unsalted margarine with no trans fat. Vegetable oils such as canola or olive oils. Seasonings and other foods Fresh and dried herbs and spices. Salt-free seasonings. Low-sodium mustard and ketchup. Sodium-free salad dressing. Sodium-free light mayonnaise. Fresh or refrigerated horseradish. Lemon juice. Vinegar. Homemade, reduced-sodium, or low-sodium soups. Unsalted popcorn and pretzels. Low-salt or salt-free chips. What foods are not recommended? The items listed may not be a complete list. Talk with your dietitian about what dietary choices are best for you. Grains Instant hot cereals. Bread  stuffing, pancake, and biscuit mixes. Croutons. Seasoned rice or pasta mixes. Noodle soup cups. Boxed or frozen macaroni and cheese. Regular salted crackers. Self-rising flour. Vegetables Sauerkraut, pickled vegetables, and relishes. Olives. [place] fries. Onion rings. Regular canned vegetables (not low-sodium or reduced-sodium). Regular canned tomato sauce and paste (not low-sodium or reduced-sodium). Regular tomato and vegetable juice (not low-sodium or reduced-sodium). Frozen vegetables in sauces. Meats and other protein foods Meat or fish that is salted, canned, smoked, spiced, or pickled. Bacon, ham, sausage, hotdogs, corned beef, chipped beef, packaged lunch meats, salt pork, jerky, pickled herring, anchovies, regular canned tuna, sardines, salted nuts. Dairy Processed cheese and cheese spreads. Cheese curds. Blue cheese. Feta cheese. String cheese. Regular cottage cheese. Buttermilk. Canned milk. Fats and oils Salted butter. Regular margarine. Ghee. Bacon fat. Seasonings and other foods Onion salt, garlic salt, seasoned salt, table salt, and sea salt. Canned and packaged gravies. Worcestershire sauce. Tartar sauce. Barbecue sauce. Teriyaki sauce. Soy sauce, including reduced-sodium. Steak sauce. Fish sauce. Oyster sauce. Cocktail sauce. Horseradish that you find on the shelf. Regular ketchup and mustard. Meat flavorings and tenderizers. Bouillon cubes. Hot sauce and Tabasco sauce. Premade or packaged marinades. Premade or packaged taco seasonings. Relishes. Regular salad dressings. Salsa. Potato and tortilla chips. Corn chips and puffs. Salted popcorn and pretzels. Canned or dried soups. Pizza. Frozen entrees and pot pies. Summary  Eating less sodium can help lower your blood pressure, reduce swelling, and protect your heart, liver, and kidneys.  Most people on this plan should limit their sodium intake to 1,500-2,000 mg (milligrams) of sodium each day.  Canned, boxed, and frozen foods are  high in sodium. Restaurant foods, fast foods, and pizza are also very high in sodium. You also get sodium by adding salt to food.  Try to cook at home, eat more fresh fruits and vegetables, and eat less fast food, canned, processed, or prepared foods. This information is not intended to replace advice given to you by your health care provider. Make sure you discuss any questions you have with your health care provider. Document Revised: 04/02/2017 Document Reviewed: 04/13/2016 Elsevier Patient Education  2020 [employer].

## 2020-03-05 NOTE — Discharge Summary (Signed)
Physician Discharge Summary  HORACIO WERTH JXB:[id number] DOB: Jul 28, 1975 DOA: 03/03/2020  PCP: Patient, No Pcp Per  Admit date: 03/03/2020 Discharge date: 03/05/2020  Admitted From: home Disposition:  home   Recommendations for Outpatient Follow-up:  1. Cardiology to please discuss possibility of PFO (on ECHO) with patient  Home Health:  none  Discharge Condition:  stable   CODE STATUS:  Full code   Diet recommendation:  Heart healthy Consultations:  cardiology  Procedures/Studies: . ECHO   Discharge Diagnoses:  Principal Problem:   Chest pain/  Hypertensive emergency Active Problems:   NICM (nonischemic cardiomyopathy) (HCC)   Elevated troponin   Alcohol abuse   Leukocytosis   Hypokalemia   Hyperlipidemia     Brief Summary: Derrick Hoffman is a 44 y.o. male with medical history significant for hypertension, medication noncompliance, alcohol abuse, hypertensive urgency, NSTEMI, systolic heart failure, alcoholic cardiomyopathy, presented to the hospital for chief concern of chest pain.  He reports the chest pain started at approximately 6 PM on 03/04/2019.  He states the pain is sharp, persistent, 6 out of 10, no radiation.  He endorses associated shortness of breath, nausea and vomiting on his way to the emergency department yesterday, 03/03/2020.  Reports that the pain is similar to his presentation for when he came in last May. He reports he has not been able to obtain his blood pressure and heart medications for months due to inability to afford the medications. He has not taken his medications in a few months.    Hospital Course:   Hypertensive emergency with chest pain and dyspnea - Chronic systolic CHF - improved after resuming his meds - troponin 40 and then subsequently improving - cardiology states that they sent him paperwork to work on to obtain meds- he states he never received it - ECHO noted below- EF unchanged- he may have a PFO- cardiology to  follow on this- nothing to do for now - will receive meds via TOC - will try to get appt with [facility] - Cardiology will f/u as outpt as well  Hypokalemia and hypomagnesemia - replaced  Alcohol use - he states he has no withdrawal- he does not drink daily-   Anxiety - he will try to get back in with [facility] to assist  Discharge Exam: Vitals:   03/05/20 0446 03/05/20 1001  BP: (!) 129/104 (!) 135/98  Pulse: 91 87  Resp: 16   Temp: 98.4 F (36.9 C)   SpO2: 100%    Vitals:   03/04/20 2345 03/05/20 0445 03/05/20 0446 03/05/20 1001  BP: (!) 125/92  (!) 129/104 (!) 135/98  Pulse: 90  91 87  Resp: 14  16   Temp: 98.9 F (37.2 C)  98.4 F (36.9 C)   TempSrc: Oral  Oral   SpO2: 99%  100%   Weight:  96.3 kg    Height:        General: Pt is alert, awake, not in acute distress Cardiovascular: RRR, S1/S2 +, no rubs, no gallops Respiratory: CTA bilaterally, no wheezing, no rhonchi Abdominal: Soft, NT, ND, bowel sounds + Extremities: no edema, no cyanosis   Discharge Instructions  Discharge Instructions    (HEART FAILURE PATIENTS) Call MD:  Anytime you have any of the following symptoms: 1) 3 pound weight gain in 24 hours or 5 pounds in 1 week 2) shortness of breath, with or without a dry hacking cough 3) swelling in the hands, feet or stomach 4) if you have to sleep  on extra pillows at night in order to breathe.   Complete by: As directed    Diet - low sodium heart healthy   Complete by: As directed    Increase activity slowly   Complete by: As directed      Allergies as of 03/05/2020      Reactions   Penicillins Swelling   Did it involve swelling of the face/tongue/throat, SOB, or low BP? Y Did it involve sudden or severe rash/hives, skin peeling, or any reaction on the inside of your mouth or nose? N Did you need to seek medical attention at a hospital or doctor's office? Y When did it last happen? 10 years If all above answers are "NO", may proceed with  cephalosporin use.      Medication List    STOP taking these medications   folic acid 1 MG tablet Commonly known as: FOLVITE     TAKE these medications   aspirin 81 MG EC tablet Take 1 tablet (81 mg total) by mouth daily.   atorvastatin 20 MG tablet Commonly known as: LIPITOR Take 1 tablet (20 mg total) by mouth daily.   carvedilol 25 MG tablet Commonly known as: COREG Take 1 tablet (25 mg total) by mouth 2 (two) times daily with a meal.   multivitamin with minerals Tabs tablet Take 1 tablet by mouth daily.   pantoprazole 40 MG tablet Commonly known as: Protonix Take 1 tablet (40 mg total) by mouth daily.   sacubitril-valsartan 49-51 MG Commonly known as: ENTRESTO Take 1 tablet by mouth 2 (two) times daily.   spironolactone 25 MG tablet Commonly known as: ALDACTONE Take 0.5 tablets (12.5 mg total) by mouth daily.   thiamine 100 MG tablet Take 1 tablet (100 mg total) by mouth daily.       Follow-up Information    [facility].   Why: GO: Nov. 11 @ 11:10am Contact information: [address] [place] [address] [phone]             Allergies  Allergen Reactions  . Penicillins Swelling    Did it involve swelling of the face/tongue/throat, SOB, or low BP? Y Did it involve sudden or severe rash/hives, skin peeling, or any reaction on the inside of your mouth or nose? N Did you need to seek medical attention at a hospital or doctor's office? Y When did it last happen? 10 years If all above answers are "NO", may proceed with cephalosporin use.      DG Chest 2 View  Result Date: 03/03/2020 CLINICAL DATA:  Left-sided chest pain. EXAM: CHEST - 2 VIEW COMPARISON:  Sep 24, 2019 FINDINGS: The heart size and mediastinal contours are within normal limits. Both lungs are clear. The visualized skeletal structures are unremarkable. IMPRESSION: No active cardiopulmonary disease. Electronically Signed   By:  Aram Candela M.D.   On: 03/03/2020 21:19   ECHOCARDIOGRAM COMPLETE  Result Date: 03/05/2020    ECHOCARDIOGRAM REPORT   Patient Name:   Derrick Hoffman Date of Exam: 03/05/2020 Medical Rec #:  [id number]     Height:       74.0 in Accession #:    [id number]    Weight:       212.2 lb Date of Birth:  Jun 27, 1975     BSA:          2.229 m Patient Age:    44 years      BP:  129/104 mmHg Patient Gender: M             HR:           88 bpm. Exam Location:  Inpatient Procedure: 2D Echo, Cardiac Doppler and Color Doppler Indications:    R07.9* Chest pain, unspecified  History:        Patient has prior history of Echocardiogram examinations, most                 recent 09/25/2019. Risk Factors:Hypertension and Current Smoker.                 Alcohol use.  Sonographer:    Tiffany Dance Referring Phys: [id number] AMY N COX IMPRESSIONS  1. Left ventricular ejection fraction, by estimation, is 25 to 30%. The left ventricle has severely decreased function. The left ventricle demonstrates global hypokinesis with regional variation including mid to distal anterior, apical and inferior akinesis. The left ventricular internal cavity size was mildly dilated. There is mild left ventricular hypertrophy. Left ventricular diastolic parameters are consistent with Grade II diastolic dysfunction (pseudonormalization). Elevated left ventricular end-diastolic pressure. The E/e' is 16.  2. Right ventricular systolic function is mildly reduced. The right ventricular size is normal.  3. Left atrial size was mildly dilated.  4. The mitral valve is grossly normal. Mild mitral valve regurgitation.  5. The aortic valve is tricuspid. Aortic valve regurgitation is trivial.  6. Aortic dilatation noted. There is mild dilatation of the aortic root, measuring 39 mm.  7. The inferior vena cava is normal in size with greater than 50% respiratory variability, suggesting right atrial pressure of 3 mmHg.  8. Possible PFO by color doppler. Comparison(s):  No significant change from prior study. 09/25/2019: LVEF 25-30%, global HK and anteroseptal wall akinesis, grade 2 DD. FINDINGS  Left Ventricle: Left ventricular ejection fraction, by estimation, is 25 to 30%. The left ventricle has severely decreased function. The left ventricle demonstrates global hypokinesis. The left ventricular internal cavity size was mildly dilated. There is mild left ventricular hypertrophy. Left ventricular diastolic parameters are consistent with Grade II diastolic dysfunction (pseudonormalization). Elevated left ventricular end-diastolic pressure. The E/e' is 16. Right Ventricle: The right ventricular size is normal. No increase in right ventricular wall thickness. Right ventricular systolic function is mildly reduced. Left Atrium: Left atrial size was mildly dilated. Right Atrium: Right atrial size was normal in size. Pericardium: There is no evidence of pericardial effusion. Mitral Valve: The mitral valve is grossly normal. Mild mitral valve regurgitation. Tricuspid Valve: The tricuspid valve is grossly normal. Tricuspid valve regurgitation is trivial. Aortic Valve: The aortic valve is tricuspid. Aortic valve regurgitation is trivial. Pulmonic Valve: The pulmonic valve was grossly normal. Pulmonic valve regurgitation is trivial. Aorta: Aortic dilatation noted. There is mild dilatation of the aortic root, measuring 39 mm. Venous: The inferior vena cava is normal in size with greater than 50% respiratory variability, suggesting right atrial pressure of 3 mmHg. IAS/Shunts: Possible PFO by color doppler.  LEFT VENTRICLE PLAX 2D LVIDd:         5.90 cm  Diastology LVIDs:         5.00 cm  LV e' medial:    5.33 cm/s LV PW:         1.70 cm  LV E/e' medial:  14.1 LV IVS:        1.10 cm  LV e' lateral:   4.35 cm/s LVOT diam:     2.40 cm  LV E/e' lateral: 17.3 LV  SV:         67 LV SV Index:   30 LVOT Area:     4.52 cm  RIGHT VENTRICLE            IVC RV Basal diam:  2.30 cm    IVC diam: 1.40 cm RV S  prime:     6.74 cm/s TAPSE (M-mode): 1.4 cm LEFT ATRIUM             Index       RIGHT ATRIUM           Index LA diam:        4.50 cm 2.02 cm/m  RA Area:     13.60 cm LA Vol (A2C):   86.3 ml 38.72 ml/m RA Volume:   32.40 ml  14.54 ml/m LA Vol (A4C):   79.3 ml 35.58 ml/m LA Biplane Vol: 82.7 ml 37.10 ml/m  AORTIC VALVE LVOT Vmax:   80.30 cm/s LVOT Vmean:  56.300 cm/s LVOT VTI:    0.148 m  AORTA Ao Root diam: 3.90 cm Ao Asc diam:  3.30 cm MITRAL VALVE MV Area (PHT): 4.21 cm    SHUNTS MV Decel Time: 180 msec    Systemic VTI:  0.15 m MV E velocity: 75.40 cm/s  Systemic Diam: 2.40 cm MV A velocity: 49.30 cm/s MV E/A ratio:  1.53 Zoila Shutter MD Electronically signed by Zoila Shutter MD Signature Date/Time: 03/05/2020/12:28:20 PM    Final    CT Angio Chest/Abd/Pel for Dissection W and/or Wo Contrast  Result Date: 03/03/2020 CLINICAL DATA:  Left-sided chest pain. EXAM: CT ANGIOGRAPHY CHEST, ABDOMEN AND PELVIS TECHNIQUE: Non-contrast CT of the chest was initially obtained. Multidetector CT imaging through the chest, abdomen and pelvis was performed using the standard protocol during bolus administration of intravenous contrast. Multiplanar reconstructed images and MIPs were obtained and reviewed to evaluate the vascular anatomy. CONTRAST:  OMNIPAQUE IOHEXOL 350 MG/ML SOLN COMPARISON:  Sep 24, 2019 FINDINGS: CTA CHEST FINDINGS Cardiovascular: The thoracic aorta is normal in appearance, without evidence of aneurysmal dilatation or dissection. Satisfactory opacification of the pulmonary arteries to the segmental level. No evidence of pulmonary embolism. Normal heart size. No pericardial effusion. Mediastinum/Nodes: No enlarged mediastinal, hilar, or axillary lymph nodes. Thyroid gland, trachea, and esophagus demonstrate no significant findings. Lungs/Pleura: Lungs are clear. No pleural effusion or pneumothorax. Musculoskeletal: No chest wall abnormality. No acute or significant osseous findings. Review of the  MIP images confirms the above findings. CTA ABDOMEN AND PELVIS FINDINGS VASCULAR Aorta: Normal caliber aorta without aneurysm, dissection, vasculitis or significant stenosis. Celiac: Patent without evidence of aneurysm, dissection, vasculitis or significant stenosis. SMA: Patent without evidence of aneurysm, dissection, vasculitis or significant stenosis. Renals: Both renal arteries are patent without evidence of aneurysm, dissection, vasculitis, fibromuscular dysplasia or significant stenosis. IMA: Patent without evidence of aneurysm, dissection, vasculitis or significant stenosis. Inflow: Patent without evidence of aneurysm, dissection, vasculitis or significant stenosis. Veins: No obvious venous abnormality within the limitations of this arterial phase study. Review of the MIP images confirms the above findings. NON-VASCULAR Hepatobiliary: There is diffuse fatty infiltration of the liver parenchyma. No focal liver abnormality is seen. No gallstones, gallbladder wall thickening, or biliary dilatation. Pancreas: Unremarkable. No pancreatic ductal dilatation or surrounding inflammatory changes. Spleen: Normal in size without focal abnormality. Adrenals/Urinary Tract: Adrenal glands are unremarkable. Kidneys are normal, without renal calculi, focal lesion, or hydronephrosis. The urinary bladder is contracted and subsequently limited in evaluation. Stomach/Bowel: Stomach is within normal limits. The  appendix is not clearly identified. No evidence of bowel wall thickening, distention, or inflammatory changes. Noninflamed diverticula are seen within the sigmoid colon. Lymphatic: No abnormal abdominal or pelvic lymph nodes are identified. Reproductive: Prostate is unremarkable. Other: No abdominal wall hernia or abnormality. No abdominopelvic ascites. Musculoskeletal: No acute or significant osseous findings. Review of the MIP images confirms the above findings. IMPRESSION: 1. No evidence of aortic dissection or  aneurysm. 2. No evidence of pulmonary embolism. 3. Hepatic steatosis. 4. Sigmoid diverticulosis. Electronically Signed   By: Aram Candela M.D.   On: 03/03/2020 22:37     The results of significant diagnostics from this hospitalization (including imaging, microbiology, ancillary and laboratory) are listed below for reference.     Microbiology: Recent Results (from the past 240 hour(s))  Respiratory Panel by RT PCR (Flu A&B, Covid) - Nasopharyngeal Swab     Status: None   Collection Time: 03/03/20  9:53 PM   Specimen: Nasopharyngeal Swab  Result Value Ref Range Status   SARS Coronavirus 2 by RT PCR NEGATIVE NEGATIVE Final    Comment: (NOTE) SARS-CoV-2 target nucleic acids are NOT DETECTED.  The SARS-CoV-2 RNA is generally detectable in upper respiratoy specimens during the acute phase of infection. The lowest concentration of SARS-CoV-2 viral copies this assay can detect is 131 copies/mL. A negative result does not preclude SARS-Cov-2 infection and should not be used as the sole basis for treatment or other patient management decisions. A negative result may occur with  improper specimen collection/handling, submission of specimen other than nasopharyngeal swab, presence of viral mutation(s) within the areas targeted by this assay, and inadequate number of viral copies (<131 copies/mL). A negative result must be combined with clinical observations, patient history, and epidemiological information. The expected result is Negative.  Fact Sheet for Patients:  https://www.moore.com/  Fact Sheet for Healthcare Providers:  https://www.young.biz/  This test is no t yet approved or cleared by the [place] FDA and  has been authorized for detection and/or diagnosis of SARS-CoV-2 by FDA under an Emergency Use Authorization (EUA). This EUA will remain  in effect (meaning this test can be used) for the duration of the COVID-19 declaration under  Section 564(b)(1) of the Act, 21 U.S.C. section 360bbb-3(b)(1), unless the authorization is terminated or revoked sooner.     Influenza A by PCR NEGATIVE NEGATIVE Final   Influenza B by PCR NEGATIVE NEGATIVE Final    Comment: (NOTE) The Xpert Xpress SARS-CoV-2/FLU/RSV assay is intended as an aid in  the diagnosis of influenza from Nasopharyngeal swab specimens and  should not be used as a sole basis for treatment. Nasal washings and  aspirates are unacceptable for Xpert Xpress SARS-CoV-2/FLU/RSV  testing.  Fact Sheet for Patients: https://www.moore.com/  Fact Sheet for Healthcare Providers: https://www.young.biz/  This test is not yet approved or cleared by the [place] FDA and  has been authorized for detection and/or diagnosis of SARS-CoV-2 by  FDA under an Emergency Use Authorization (EUA). This EUA will remain  in effect (meaning this test can be used) for the duration of the  Covid-19 declaration under Section 564(b)(1) of the Act, 21  U.S.C. section 360bbb-3(b)(1), unless the authorization is  terminated or revoked. Performed at [facility], [address] [place], [place] [address]      Labs: BNP (last 3 results) Recent Labs    03/03/20 2119  BNP 350.0*   Basic Metabolic Panel: Recent Labs  Lab 03/03/20 2119 03/04/20 1752 03/04/20 1904 03/05/20 0343  NA 138  --   --  138  K 3.0*  --  3.6 3.4*  CL 98  --   --  102  CO2 25  --   --  25  GLUCOSE 119*  --   --  102*  BUN 7  --   --  10  CREATININE 1.05  --   --  1.06  CALCIUM 9.1  --   --  8.7*  MG  --   --   --  1.4*  PHOS  --  2.5  --   --    Liver Function Tests: Recent Labs  Lab 03/03/20 2119 03/05/20 0343  AST 222* 81*  ALT 137* 78*  ALKPHOS 117 85  BILITOT 2.7* 3.9*  PROT 9.1* 7.1  ALBUMIN 4.5 3.4*   Recent Labs  Lab 03/03/20 2119  LIPASE 33   No results for input(s): AMMONIA in the last 168 hours. CBC: Recent Labs  Lab  03/03/20 2119 03/05/20 0343  WBC 13.1* 10.2  HGB 17.1* 16.0  HCT 50.1 48.0  MCV 96.5 99.0  PLT 247 232   Cardiac Enzymes: No results for input(s): CKTOTAL, CKMB, CKMBINDEX, TROPONINI in the last 168 hours. BNP: Invalid input(s): POCBNP CBG: No results for input(s): GLUCAP in the last 168 hours. D-Dimer No results for input(s): DDIMER in the last 72 hours. Hgb A1c No results for input(s): HGBA1C in the last 72 hours. Lipid Profile No results for input(s): CHOL, HDL, LDLCALC, TRIG, CHOLHDL, LDLDIRECT in the last 72 hours. Thyroid function studies Recent Labs    03/04/20 1608  TSH 0.874   Anemia work up No results for input(s): VITAMINB12, FOLATE, FERRITIN, TIBC, IRON, RETICCTPCT in the last 72 hours. Urinalysis    Component Value Date/Time   COLORURINE YELLOW 09/25/2019 1300   APPEARANCEUR CLEAR 09/25/2019 1300   LABSPEC 1.011 09/25/2019 1300   PHURINE 8.0 09/25/2019 1300   GLUCOSEU NEGATIVE 09/25/2019 1300   HGBUR NEGATIVE 09/25/2019 1300   BILIRUBINUR NEGATIVE 09/25/2019 1300   KETONESUR 20 (A) 09/25/2019 1300   PROTEINUR NEGATIVE 09/25/2019 1300   NITRITE NEGATIVE 09/25/2019 1300   LEUKOCYTESUR NEGATIVE 09/25/2019 1300   Sepsis Labs Invalid input(s): PROCALCITONIN,  WBC,  LACTICIDVEN Microbiology Recent Results (from the past 240 hour(s))  Respiratory Panel by RT PCR (Flu A&B, Covid) - Nasopharyngeal Swab     Status: None   Collection Time: 03/03/20  9:53 PM   Specimen: Nasopharyngeal Swab  Result Value Ref Range Status   SARS Coronavirus 2 by RT PCR NEGATIVE NEGATIVE Final    Comment: (NOTE) SARS-CoV-2 target nucleic acids are NOT DETECTED.  The SARS-CoV-2 RNA is generally detectable in upper respiratoy specimens during the acute phase of infection. The lowest concentration of SARS-CoV-2 viral copies this assay can detect is 131 copies/mL. A negative result does not preclude SARS-Cov-2 infection and should not be used as the sole basis for treatment  or other patient management decisions. A negative result may occur with  improper specimen collection/handling, submission of specimen other than nasopharyngeal swab, presence of viral mutation(s) within the areas targeted by this assay, and inadequate number of viral copies (<131 copies/mL). A negative result must be combined with clinical observations, patient history, and epidemiological information. The expected result is Negative.  Fact Sheet for Patients:  https://www.moore.com/  Fact Sheet for Healthcare Providers:  https://www.young.biz/  This test is no t yet approved or cleared by the [place] FDA and  has been authorized for detection and/or diagnosis of  SARS-CoV-2 by FDA under an Emergency Use Authorization (EUA). This EUA will remain  in effect (meaning this test can be used) for the duration of the COVID-19 declaration under Section 564(b)(1) of the Act, 21 U.S.C. section 360bbb-3(b)(1), unless the authorization is terminated or revoked sooner.     Influenza A by PCR NEGATIVE NEGATIVE Final   Influenza B by PCR NEGATIVE NEGATIVE Final    Comment: (NOTE) The Xpert Xpress SARS-CoV-2/FLU/RSV assay is intended as an aid in  the diagnosis of influenza from Nasopharyngeal swab specimens and  should not be used as a sole basis for treatment. Nasal washings and  aspirates are unacceptable for Xpert Xpress SARS-CoV-2/FLU/RSV  testing.  Fact Sheet for Patients: https://www.moore.com/  Fact Sheet for Healthcare Providers: https://www.young.biz/  This test is not yet approved or cleared by the [place] FDA and  has been authorized for detection and/or diagnosis of SARS-CoV-2 by  FDA under an Emergency Use Authorization (EUA). This EUA will remain  in effect (meaning this test can be used) for the duration of the  Covid-19 declaration under Section 564(b)(1) of the Act, 21  U.S.C.  section 360bbb-3(b)(1), unless the authorization is  terminated or revoked. Performed at [facility], [address], [place], [place] [address]      Time coordinating discharge in minutes: 55  SIGNED:   Calvert Cantor, MD  [facility] 03/05/2020, 4:35 PM

## 2020-03-05 NOTE — Progress Notes (Signed)
Heart Failure Stewardship Pharmacist Progress Note   PCP: Patient, No Pcp Per PCP-Cardiologist: Nanetta Batty, MD    HPI:  44 yo M with PMH of HTN, medication noncompliance, alcohol abuse, CHF, and CAD. He presented to the ED on 03/03/20 with chest pain and HTN emergency. He has not been able to afford his medications for months. An ECHO was done on 03/05/20 and LVEF is 25-30% (stable from ECHO in May 2021).   Current HF Medications: Carvedilol 25 mg BID Entresto 49/51 mg BID Spironolactone 25 mg daily  Prior to admission HF Medications: None - no access for 2-3 months  Pertinent Lab Values: . Serum creatinine 1.06, BUN 10, Potassium 3.4, Sodium 138, BNP 350, Magnesium 1.4   Vital Signs: . Weight: 212 lbs (admission weight: 208 lbs) . Blood pressure: 130/90-100s  . Heart rate: 80s   Medication Assistance / Insurance Benefits Check: Does the patient have prescription insurance?  No  Does the patient qualify for medication assistance through manufacturers or grants?   Yes . Eligible grants and/or patient assistance programs: Entresto . Medication assistance applications in progress: none  . Medication assistance applications approved: none Approved medication assistance renewals will be completed by: Dr. Hazle Coca office  Outpatient Pharmacy:  Prior to admission outpatient pharmacy: [facility] Is the patient willing to use [facility] pharmacy at discharge? Yes Is the patient willing to transition their outpatient pharmacy to utilize a [facility] outpatient pharmacy?   Pending    Assessment: 1. Acute on chronic systolic CHF (EF 91-50%), due to NICM, multifactorial - uncontrolled HTN, medication noncompliance, alcohol abuse. NYHA class II symptoms. - Continue carvedilol 25 mg BID - Continue Entresto 49/51 mg BID - Continue spironolactone 25 mg daily - Consider starting Farxiga 10 mg daily prior to discharge   Plan: 1) Medication changes recommended at this time: - Start  Farxiga 10 mg daily  2) Patient assistance application(s): - Will begin patient assistance application for Sherryll Burger - he has already used free 30-day copay card from [employer] records. Not eligible for $10 copay card since he does not have commercial insurance. Therefore, patient assistance through [employer] is the best option for him at this time.  - Can also initiate patient assistance application for Farxiga if this is added  3)  Education  - To be completed prior to discharge  Sharen Hones, PharmD, BCPS Heart Failure Stewardship Pharmacist Phone [phone]

## 2020-03-05 NOTE — Progress Notes (Signed)
Patient placed on standby to take a shower. Wife is at the bedside for personal assistance if needed, however patient is independent in the room

## 2020-03-05 NOTE — Progress Notes (Signed)
Patient family at bedside walked to [facility] to pick up prescriptions needed. However, the clinic is unable to give patient his prescriptions until tomorrow morning. Patient given afternoon coreg scheduled and entresto pulled to give patient for PM dose as well. Patient given heart failure education packet and teaching discussed for compliance of medications, signs and symptoms of heart failure exacerbations, and reasons to return to the ER. Patient verbalized understanding. IVs removed and heart monitored discontinued. Patient also aware of community health follow up appointment

## 2020-03-05 NOTE — Plan of Care (Signed)

## 2020-03-05 NOTE — Progress Notes (Signed)
.   Progress Note  Patient Name: Derrick Hoffman Date of Encounter: 03/05/2020  Primary Cardiologist: Nanetta Batty, MD   Subjective   44 yo M with hx of NICM EF 25% in the setting of alcohol abuse, hepatic steatosis and alcohol abuse with transaminitis, Tobacco use who presented to [facility] with HTN Emergency.  Restarted on his home medications (did not have access for 2-3 months).  Patient notes that he feels a little bit shaky and sweaty.  Poor Appetite and sleepy from being in the hospital.  Inpatient Medications    Scheduled Meds: . aspirin EC  81 mg Oral Daily  . atorvastatin  20 mg Oral Daily  . carvedilol  25 mg Oral BID WC  . folic acid  1 mg Oral Daily  . heparin injection (subcutaneous)  5,000 Units Subcutaneous Q8H  . lidocaine  1 patch Transdermal Q24H  . LORazepam  0-4 mg Intravenous Q6H   Or  . LORazepam  0-4 mg Oral Q6H  . [START ON 03/06/2020] LORazepam  0-4 mg Intravenous Q12H   Or  . [START ON 03/06/2020] LORazepam  0-4 mg Oral Q12H  . multivitamin with minerals  1 tablet Oral Daily  . pantoprazole  40 mg Oral Daily  . sacubitril-valsartan  1 tablet Oral BID  . spironolactone  12.5 mg Oral Daily  . thiamine  100 mg Oral Daily   Or  . thiamine  100 mg Intravenous Daily   Continuous Infusions: . sodium chloride Stopped (03/04/20 0619)   PRN Meds: sodium chloride   Vital Signs    Vitals:   03/04/20 2002 03/04/20 2345 03/05/20 0445 03/05/20 0446  BP: 107/84 (!) 125/92  (!) 129/104  Pulse: 94 90  91  Resp: 16 14  16   Temp: 98.7 F (37.1 C) 98.9 F (37.2 C)  98.4 F (36.9 C)  TempSrc: Oral Oral  Oral  SpO2: 97% 99%  100%  Weight:   96.3 kg   Height:        Intake/Output Summary (Last 24 hours) at 03/05/2020 0915 Last data filed at 03/05/2020 0914 Gross per 24 hour  Intake 1689.48 ml  Output 150 ml  Net 1539.48 ml   Filed Weights   03/03/20 2106 03/04/20 1511 03/05/20 0445  Weight: 102.1 kg 94.5 kg 96.3 kg    Telemetry    Sinus rhythm with  TWI - Personally Reviewed  ECG    SR rate 91, LVH Diffuse TWI,QTc 560 with QRS duration 104  - Personally Reviewed  Physical Exam   GEN: No acute distress.   Neck: No JVD Cardiac: RRR, no murmurs, rubs, or gallops.  Respiratory: Clear to auscultation bilaterally. GI: Soft, nontender, non-distended  MS: No edema; No deformity. Neuro:  Nonfocal  Psych: Normal affect   Labs    Chemistry Recent Labs  Lab 03/03/20 2119 03/04/20 1904 03/05/20 0343  NA 138  --  138  K 3.0* 3.6 3.4*  CL 98  --  102  CO2 25  --  25  GLUCOSE 119*  --  102*  BUN 7  --  10  CREATININE 1.05  --  1.06  CALCIUM 9.1  --  8.7*  PROT 9.1*  --  7.1  ALBUMIN 4.5  --  3.4*  AST 222*  --  81*  ALT 137*  --  78*  ALKPHOS 117  --  85  BILITOT 2.7*  --  3.9*  GFRNONAA >60  --  >60  ANIONGAP 15  --  11     Hematology Recent Labs  Lab 03/03/20 2119 03/05/20 0343  WBC 13.1* 10.2  RBC 5.19 4.85  HGB 17.1* 16.0  HCT 50.1 48.0  MCV 96.5 99.0  MCH 32.9 33.0  MCHC 34.1 33.3  RDW 14.7 14.9  PLT 247 232    Cardiac EnzymesNo results for input(s): TROPONINI in the last 168 hours. No results for input(s): TROPIPOC in the last 168 hours.   BNP Recent Labs  Lab 03/03/20 2119  BNP 350.0*     DDimer No results for input(s): DDIMER in the last 168 hours.   Radiology    DG Chest 2 View  Result Date: 03/03/2020 CLINICAL DATA:  Left-sided chest pain. EXAM: CHEST - 2 VIEW COMPARISON:  Sep 24, 2019 FINDINGS: The heart size and mediastinal contours are within normal limits. Both lungs are clear. The visualized skeletal structures are unremarkable. IMPRESSION: No active cardiopulmonary disease. Electronically Signed   By: Aram Candela M.D.   On: 03/03/2020 21:19   CT Angio Chest/Abd/Pel for Dissection W and/or Wo Contrast  Result Date: 03/03/2020 CLINICAL DATA:  Left-sided chest pain. EXAM: CT ANGIOGRAPHY CHEST, ABDOMEN AND PELVIS TECHNIQUE: Non-contrast CT of the chest was initially obtained.  Multidetector CT imaging through the chest, abdomen and pelvis was performed using the standard protocol during bolus administration of intravenous contrast. Multiplanar reconstructed images and MIPs were obtained and reviewed to evaluate the vascular anatomy. CONTRAST:  OMNIPAQUE IOHEXOL 350 MG/ML SOLN COMPARISON:  Sep 24, 2019 FINDINGS: CTA CHEST FINDINGS Cardiovascular: The thoracic aorta is normal in appearance, without evidence of aneurysmal dilatation or dissection. Satisfactory opacification of the pulmonary arteries to the segmental level. No evidence of pulmonary embolism. Normal heart size. No pericardial effusion. Mediastinum/Nodes: No enlarged mediastinal, hilar, or axillary lymph nodes. Thyroid gland, trachea, and esophagus demonstrate no significant findings. Lungs/Pleura: Lungs are clear. No pleural effusion or pneumothorax. Musculoskeletal: No chest wall abnormality. No acute or significant osseous findings. Review of the MIP images confirms the above findings. CTA ABDOMEN AND PELVIS FINDINGS VASCULAR Aorta: Normal caliber aorta without aneurysm, dissection, vasculitis or significant stenosis. Celiac: Patent without evidence of aneurysm, dissection, vasculitis or significant stenosis. SMA: Patent without evidence of aneurysm, dissection, vasculitis or significant stenosis. Renals: Both renal arteries are patent without evidence of aneurysm, dissection, vasculitis, fibromuscular dysplasia or significant stenosis. IMA: Patent without evidence of aneurysm, dissection, vasculitis or significant stenosis. Inflow: Patent without evidence of aneurysm, dissection, vasculitis or significant stenosis. Veins: No obvious venous abnormality within the limitations of this arterial phase study. Review of the MIP images confirms the above findings. NON-VASCULAR Hepatobiliary: There is diffuse fatty infiltration of the liver parenchyma. No focal liver abnormality is seen. No gallstones, gallbladder wall  thickening, or biliary dilatation. Pancreas: Unremarkable. No pancreatic ductal dilatation or surrounding inflammatory changes. Spleen: Normal in size without focal abnormality. Adrenals/Urinary Tract: Adrenal glands are unremarkable. Kidneys are normal, without renal calculi, focal lesion, or hydronephrosis. The urinary bladder is contracted and subsequently limited in evaluation. Stomach/Bowel: Stomach is within normal limits. The appendix is not clearly identified. No evidence of bowel wall thickening, distention, or inflammatory changes. Noninflamed diverticula are seen within the sigmoid colon. Lymphatic: No abnormal abdominal or pelvic lymph nodes are identified. Reproductive: Prostate is unremarkable. Other: No abdominal wall hernia or abnormality. No abdominopelvic ascites. Musculoskeletal: No acute or significant osseous findings. Review of the MIP images confirms the above findings. IMPRESSION: 1. No evidence of aortic dissection or aneurysm. 2. No evidence of pulmonary embolism. 3. Hepatic  steatosis. 4. Sigmoid diverticulosis. Electronically Signed   By: Aram Candela M.D.   On: 03/03/2020 22:37    Cardiac Studies   Echo Pending  Patient Profile     44 y.o. male HFrEF in the setting of alcohol and HTN Emergency  Assessment & Plan    Chronic Systolic Heart Failure Alcohol Abuse Tobacco Abuse  HTN with HTN Emergency (clarification) QTc Prolongation Social Determinants of Health:  Unable to afford medications - NYHA class II, Stage C, euvolemic, etiology from alcohol highest on ddx - Strict I/Os, daily weights, and fluid restriction of < 2 L  - Replace electrolytes PRN and keep K>4 and Mg>2. - Daily BMP, Mg. - Continue Coreg  - Continue Entresto - Would increase aldactone to 25 mg PO Daily - Will follow up TTE- likely will be similar to prior - Consider outpatient SGLT2i  - will defer ICD eval at this time given that he had to stop his medications - continue telemetry - If  HTN urgency issues through admission, can start hydralazine 10 mg BID (and can increase to 25 mg if necessary) - Case management assistance on medications  - Would be careful to not add any QTc prolonging medications   Transaminitis Aortic Atherosclerosis HLD Hepatitic steatosis -  LFTs improving -  continue present atorvastatin 20; will need uptitration as outpatient  Discussed with patient and primary MD  For questions or updates, please contact [facility] Please consult [email or web address] for contact info under Cardiology/STEMI.      Signed, Christell Constant, MD  03/05/2020, 9:15 AM

## 2020-03-05 NOTE — TOC Transition Note (Addendum)
Transition of Care [facility]) - CM/SW Discharge Note   Patient Details  Name: Derrick Hoffman MRN: [id number] Date of Birth: [birth year]  Transition of Care [facility]) CM/SW Contact:  Leone Haven, RN Phone Number: 03/05/2020, 4:28 PM   Clinical Narrative:    NCM spoke with patient, he will have follow up at the [facility] clinic, he has already used the 30 day coupon for the entresto and HF pharmacy started patient assistance for him.  He should be able to get 10.00 refill at the [facility] clinic.  He states he has money to pay for meds.  He has transportation.  MD will send meds to [facility] clinic.    Final next level of care: Home/Self Care Barriers to Discharge: No Barriers Identified   Patient Goals and CMS Choice Patient states their goals for this hospitalization and ongoing recovery are:: get better   Choice offered to / list presented to : NA  Discharge Placement                       Discharge Plan and Services                  DME Agency: NA       HH Arranged: NA          Social Determinants of Health (SDOH) Interventions     Readmission Risk Interventions No flowsheet data found.

## 2020-03-06 MED FILL — FOLIC ACID 1 MG TABS: 1 | 30 days supply | Qty: 30 | Fill #0

## 2020-03-06 MED FILL — CARVEDILOL 25 MG TABLET: 25 | 30 days supply | Qty: 60 | Fill #0

## 2020-03-06 MED FILL — PANTOPRAZOLE SOD DR 40 MG T: 40 | 30 days supply | Qty: 30 | Fill #0

## 2020-03-06 MED FILL — ATORVASTATIN CALCIUM 20 MG: 20 | 30 days supply | Qty: 30 | Fill #0

## 2020-03-06 MED FILL — SPIRONOLACTONE 25 MG TABLET: 25 | 30 days supply | Qty: 15 | Fill #0

## 2020-03-06 MED FILL — ENTRESTO 49 MG-51 MG TABLET: 49-51 | 30 days supply | Qty: 60 | Fill #0

## 2020-03-14 ENCOUNTER — Other Ambulatory Visit: Payer: Self-pay

## 2020-03-14 ENCOUNTER — Encounter: Payer: Self-pay | Admitting: Internal Medicine

## 2020-03-14 ENCOUNTER — Ambulatory Visit: Payer: Self-pay | Attending: Internal Medicine | Admitting: Internal Medicine

## 2020-03-14 VITALS — BP 127/86 | HR 84 | Resp 16 | Wt 219.8 lb

## 2020-03-14 DIAGNOSIS — Z2821 Immunization not carried out because of patient refusal: Secondary | ICD-10-CM

## 2020-03-14 DIAGNOSIS — Z09 Encounter for follow-up examination after completed treatment for conditions other than malignant neoplasm: Secondary | ICD-10-CM

## 2020-03-14 DIAGNOSIS — I428 Other cardiomyopathies: Secondary | ICD-10-CM

## 2020-03-14 DIAGNOSIS — E876 Hypokalemia: Secondary | ICD-10-CM

## 2020-03-14 DIAGNOSIS — R7989 Other specified abnormal findings of blood chemistry: Secondary | ICD-10-CM | POA: Insufficient documentation

## 2020-03-14 DIAGNOSIS — F1010 Alcohol abuse, uncomplicated: Secondary | ICD-10-CM

## 2020-03-14 DIAGNOSIS — I10 Essential (primary) hypertension: Secondary | ICD-10-CM

## 2020-03-14 DIAGNOSIS — R945 Abnormal results of liver function studies: Secondary | ICD-10-CM

## 2020-03-14 MED ORDER — CARVEDILOL 25 MG PO TABS: 25.0000 mg | ORAL_TABLET | Freq: Two times a day (BID) | ORAL | 6 refills | Status: AC

## 2020-03-14 MED ORDER — SACUBITRIL-VALSARTAN 49-51 MG PO TABS: 1.0000 | ORAL_TABLET | Freq: Two times a day (BID) | ORAL | 6 refills | Status: DC

## 2020-03-14 MED ORDER — SPIRONOLACTONE 25 MG PO TABS: 12.5000 mg | ORAL_TABLET | Freq: Every day | ORAL | 6 refills | Status: AC
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
## 2020-03-14 NOTE — Patient Instructions (Signed)
You should apply for the orange card/cone discount card as discussed today.  I have submitted a referral for you to follow-up with Dr. Gery Pray the cardiologist.  Bonita Quin should hold off on taking Atorvastatin until your liver enzymes have normalized.

## 2020-04-02 ENCOUNTER — Ambulatory Visit: Payer: Self-pay | Admitting: Cardiovascular Disease

## 2020-04-04 ENCOUNTER — Encounter: Payer: Self-pay | Admitting: Physician Assistant

## 2020-04-04 ENCOUNTER — Other Ambulatory Visit: Payer: Self-pay

## 2020-04-04 ENCOUNTER — Ambulatory Visit (INDEPENDENT_AMBULATORY_CARE_PROVIDER_SITE_OTHER): Payer: Self-pay | Admitting: Physician Assistant

## 2020-04-04 VITALS — BP 130/72 | HR 97 | Ht 74.0 in | Wt 217.0 lb

## 2020-04-04 DIAGNOSIS — I428 Other cardiomyopathies: Secondary | ICD-10-CM

## 2020-04-04 DIAGNOSIS — I10 Essential (primary) hypertension: Secondary | ICD-10-CM

## 2020-04-04 DIAGNOSIS — R7401 Elevation of levels of liver transaminase levels: Secondary | ICD-10-CM

## 2020-04-04 NOTE — Patient Instructions (Addendum)
Medication Instructions:  Your physician recommends that you continue on your current medications as directed. Please refer to the Current Medication list given to you today.   *If you need a refill on your cardiac medications before your next appointment, please call your pharmacy*  Lab Work: Your physician recommends that you return for lab work in :   [facility] If you have labs (blood work) drawn today and your tests are completely normal, you will receive your results only by: Marland Kitchen MyChart Message (if you have MyChart) OR . A paper copy in the mail If you have any lab test that is abnormal or we need to change your treatment, we will call you to review the results.  Testing/Procedures: NONE ordered at this time of appointment   Follow-Up: At [facility], you and your health needs are our priority.  As part of our continuing mission to provide you with exceptional heart care, we have created designated Provider Care Teams.  These Care Teams include your primary Cardiologist (physician) and Advanced Practice Providers (APPs -  Physician Assistants and Nurse Practitioners) who all work together to provide you with the care you need, when you need it.  Your next appointment:   4 week(s) for medication titration   The format for your next appointment:   In Person  Provider:   Azalee Course, PA-C  Other Instructions

## 2020-04-04 NOTE — Progress Notes (Signed)
Cardiology Office Note:    Date:  04/06/2020   ID:  Derrick Hoffman, DOB [birth year], MRN [id number]  PCP:  Derrick Matar, MD  [facility] Cardiologist:  Derrick Batty, MD  [facility] Electrophysiologist:  None   Referring MD: Derrick Matar, MD   Chief Complaint  Patient presents with  . Follow-up    seen for Dr. Allyson Hoffman    History of Present Illness:    Derrick Hoffman is a 44 y.o. male with a hx of hypertension, NICM and history of alcohol abuse and tobacco abuse.  Patient initially went to the hospital in May 2021 for hypertensive urgency and NSTEMI and found to have low EF of 25 to 30% with global hypokinesis on echocardiogram 09/25/2019.  Cardiac catheterization obtained on 09/25/2019 showed normal coronary arteries.  He eventually was discharged on carvedilol, spironolactone and Entresto.  He was last seen by Dr. Allyson Hoffman in June 2021, carvedilol was increased to 25 mg twice a day.  More recently, patient was admitted in early November 2021 with hypertensive urgency.  Apparently he ran out of his medication shortly after his June visit and has never received [employer] patient assistance application level sent to him.  He never followed up on the repeat echocardiogram either.  His home medications including carvedilol, Entresto and spironolactone were restarted.  Echocardiogram obtained during the same admission showed EF continue to be low at 25 to 30%, global hypokinesis with regional variations, grade 2 DD, mild MR, mildly dilated aortic root measuring at 39 mm, possible PFO.  Patient presents today for follow-up. Blood pressure is well controlled. Initially I wanted to increase Entresto to 97-103 mg twice a day, however he does not have any insurance or Medicaid, he is in the process of applying medication assistance program. Unfortunately we also do not have any samples. Therefore I decided to leave him on the 49-51 mg twice a day of medication. He appears to be euvolemic on exam.  We discussed the recent echocardiogram finding. I plan to bring the patient back in about 3 to 4 weeks for further up titration of heart failure medication before repeat echocardiogram in 3 months.    Past Medical History:  Diagnosis Date  . Abnormal liver function test   . Anxiety   . Aortic atherosclerosis (HCC)   . Childhood asthma   . Habitual alcohol use   . Hepatic steatosis   . History of kidney stones 1990  . Hypertension   . Hypokalemia   . Tobacco abuse     Past Surgical History:  Procedure Laterality Date  . ANTERIOR CRUCIATE LIGAMENT REPAIR Right 1996  . APPENDECTOMY  01/05/2018  . BUNIONECTOMY Left 2013  . LAPAROSCOPIC APPENDECTOMY N/A 01/05/2018   Procedure: APPENDECTOMY LAPAROSCOPIC;  Surgeon: Griselda Miner, MD;  Location: [facility] OR;  Service: General;  Laterality: N/A;  . LEFT HEART CATH AND CORONARY ANGIOGRAPHY N/A 09/25/2019   Procedure: LEFT HEART CATH AND CORONARY ANGIOGRAPHY;  Surgeon: Runell Gess, MD;  Location: [facility] INVASIVE CV LAB;  Service: Cardiovascular;  Laterality: N/A;  . ORIF TRIPOD FRACTURE N/A 11/03/2018   Procedure: OPEN REDUCTION INTERNAL FIXATION (ORIF) TRIPOD FRACTURE;  Surgeon: Suzanna Obey, MD;  Location: [facility] OR;  Service: ENT;  Laterality: N/A;  ORIF tripod fracture- malar fracture    Current Medications: Current Meds  Medication Sig  . carvedilol (COREG) 25 MG tablet Take 1 tablet (25 mg total) by mouth 2 (two) times daily with a meal.  . folic acid (  FOLVITE) 1 MG tablet Take 1 tablet (1 mg total) by mouth daily.  Marland Kitchen ipratropium (ATROVENT HFA) 17 MCG/ACT inhaler Inhale into the lungs.  . Multiple Vitamin (MULTIVITAMIN WITH MINERALS) TABS tablet Take 1 tablet by mouth daily.  . pantoprazole (PROTONIX) 40 MG tablet Take 1 tablet (40 mg total) by mouth daily.  . sacubitril-valsartan (ENTRESTO) 49-51 MG Take 1 tablet by mouth 2 (two) times daily.  Marland Kitchen spironolactone (ALDACTONE) 25 MG tablet Take 0.5 tablets (12.5 mg total) by mouth daily.  Marland Kitchen  thiamine 100 MG tablet Take 1 tablet (100 mg total) by mouth daily.  . [DISCONTINUED] sacubitril-valsartan (ENTRESTO) 49-51 MG Take 1 tablet by mouth 2 (two) times daily.     Allergies:   Penicillins   Social History   Socioeconomic History  . Marital status: Married    Spouse name: Not on file  . Number of children: 0  . Years of education: Not on file  . Highest education level: Bachelor's degree (e.g., BA, AB, BS)  Occupational History  . Occupation: unemployed  Tobacco Use  . Smoking status: Current Every Day Smoker    Packs/day: 0.50    Years: 1.00    Pack years: 0.50  . Smokeless tobacco: Never Used  Vaping Use  . Vaping Use: Never used  Substance and Sexual Activity  . Alcohol use: Yes    Alcohol/week: 48.0 standard drinks    Types: 48 Shots of liquor per week    Comment: 01/05/2018 " 5th of liquor/day;  3 days/wk  . Drug use: Not Currently  . Sexual activity: Not Currently  Other Topics Concern  . Not on file  Social History Narrative  . Not on file   Social Determinants of Health   Financial Resource Strain:   . Difficulty of Paying Living Expenses: Not on file  Food Insecurity:   . Worried About Programme researcher, broadcasting/film/video in the Last Year: Not on file  . Ran Out of Food in the Last Year: Not on file  Transportation Needs:   . Lack of Transportation (Medical): Not on file  . Lack of Transportation (Non-Medical): Not on file  Physical Activity:   . Days of Exercise per Week: Not on file  . Minutes of Exercise per Session: Not on file  Stress:   . Feeling of Stress : Not on file  Social Connections:   . Frequency of Communication with Friends and Family: Not on file  . Frequency of Social Gatherings with Friends and Family: Not on file  . Attends Religious Services: Not on file  . Active Member of Clubs or Organizations: Not on file  . Attends Banker Meetings: Not on file  . Marital Status: Not on file     Family History: The patient's family  history includes CAD in his mother; Diabetes in his father.  ROS:   Please see the history of present illness.     All other systems reviewed and are negative.  EKGs/Labs/Other Studies Reviewed:    The following studies were reviewed today:  Echo 03/05/2020 1. Left ventricular ejection fraction, by estimation, is 25 to 30%. The  left ventricle has severely decreased function. The left ventricle  demonstrates global hypokinesis with regional variation including mid to  distal anterior, apical and inferior  akinesis. The left ventricular internal cavity size was mildly dilated.  There is mild left ventricular hypertrophy. Left ventricular diastolic  parameters are consistent with Grade II diastolic dysfunction  (pseudonormalization). Elevated left ventricular  end-diastolic pressure. The E/e' is 16.  2. Right ventricular systolic function is mildly reduced. The right  ventricular size is normal.  3. Left atrial size was mildly dilated.  4. The mitral valve is grossly normal. Mild mitral valve regurgitation.  5. The aortic valve is tricuspid. Aortic valve regurgitation is trivial.  6. Aortic dilatation noted. There is mild dilatation of the aortic root,  measuring 39 mm.  7. The inferior vena cava is normal in size with greater than 50%  respiratory variability, suggesting right atrial pressure of 3 mmHg.  8. Possible PFO by color doppler.   Comparison(s): No significant change from prior study. 09/25/2019: LVEF  25-30%, global HK and anteroseptal wall akinesis, grade 2 DD.   EKG:  EKG is ordered today.  The ekg ordered today demonstrates normal sinus rhythm with significant LVH and early repol  Recent Labs: 03/03/2020: B Natriuretic Peptide 350.0 03/04/2020: TSH 0.874 03/05/2020: ALT 78; BUN 10; Creatinine, Ser 1.06; Hemoglobin 16.0; Magnesium 1.4; Platelets 232; Potassium 3.4; Sodium 138  Recent Lipid Panel    Component Value Date/Time   CHOL 287 (H) 09/26/2019 0416    TRIG 158 (H) 09/26/2019 0416   HDL 33 (L) 09/26/2019 0416   CHOLHDL 8.7 09/26/2019 0416   VLDL 32 09/26/2019 0416   LDLCALC 222 (H) 09/26/2019 0416     Risk Assessment/Calculations:       Physical Exam:    VS:  BP 130/72   Pulse 97   Ht 6\' 2"  (1.88 m)   Wt 217 lb (98.4 kg)   BMI 27.86 kg/m     Wt Readings from Last 3 Encounters:  04/04/20 217 lb (98.4 kg)  03/14/20 219 lb 12.8 oz (99.7 kg)  03/05/20 212 lb 3.2 oz (96.3 kg)     GEN:  Well nourished, well developed in no acute distress HEENT: Normal NECK: No JVD; No carotid bruits LYMPHATICS: No lymphadenopathy CARDIAC: RRR, no murmurs, rubs, gallops RESPIRATORY:  Clear to auscultation without rales, wheezing or rhonchi  ABDOMEN: Soft, non-tender, non-distended MUSCULOSKELETAL:  No edema; No deformity  SKIN: Warm and dry NEUROLOGIC:  Alert and oriented x 3 PSYCHIATRIC:  Normal affect   ASSESSMENT:    1. NICM (nonischemic cardiomyopathy) (HCC)   2. Elevated transaminase level   3. Essential hypertension    PLAN:    In order of problems listed above:  1. Nonischemic cardiomyopathy: He was out of heart failure medication for 42-month, ejection fraction remained low on repeat echocardiogram.  Heart failure medication has been restarted.  I wanted to increase his Entresto dosage, however he is in the process of applying for medication assistance program.  He has problem affording Entresto.  Once he is well approved by medication assistance program, I will further uptitrate his medication  2. Elevated transaminase level: Obtain CMP.  3. Hypertension: Blood pressure remains elevated, I will attempt further medication titration on the next follow-up once he is approved by medication assistance program to cover Entresto.    Shared Decision Making/Informed Consent        Medication Adjustments/Labs and Tests Ordered: Current medicines are reviewed at length with the patient today.  Concerns regarding medicines are  outlined above.  Orders Placed This Encounter  Procedures  . Comprehensive metabolic panel  . EKG 12-Lead   No orders of the defined types were placed in this encounter.   Patient Instructions  Medication Instructions:  Your physician recommends that you continue on your current medications as directed. Please refer to the  Current Medication list given to you today.   *If you need a refill on your cardiac medications before your next appointment, please call your pharmacy*  Lab Work: Your physician recommends that you return for lab work in :   [facility] If you have labs (blood work) drawn today and your tests are completely normal, you will receive your results only by: Marland Kitchen MyChart Message (if you have MyChart) OR . A paper copy in the mail If you have any lab test that is abnormal or we need to change your treatment, we will call you to review the results.  Testing/Procedures: NONE ordered at this time of appointment   Follow-Up: At [facility], you and your health needs are our priority.  As part of our continuing mission to provide you with exceptional heart care, we have created designated Provider Care Teams.  These Care Teams include your primary Cardiologist (physician) and Advanced Practice Providers (APPs -  Physician Assistants and Nurse Practitioners) who all work together to provide you with the care you need, when you need it.  Your next appointment:   4 week(s) for medication titration   The format for your next appointment:   In Person  Provider:   Azalee Course, PA-C  Other Instructions      Signed, Azalee Course, PA  04/06/2020 6:30 PM    [facility]

## 2020-04-06 ENCOUNTER — Encounter: Payer: Self-pay | Admitting: Physician Assistant

## 2020-05-02 ENCOUNTER — Encounter: Payer: Self-pay | Admitting: Physician Assistant

## 2020-05-02 NOTE — Progress Notes (Signed)
This encounter was created in error - please disregard.

## 2020-06-08 ENCOUNTER — Emergency Department (HOSPITAL_COMMUNITY): Payer: Self-pay

## 2020-06-08 ENCOUNTER — Encounter (HOSPITAL_COMMUNITY): Payer: Self-pay | Admitting: Emergency Medicine

## 2020-06-08 DIAGNOSIS — Z833 Family history of diabetes mellitus: Secondary | ICD-10-CM

## 2020-06-08 DIAGNOSIS — Z8249 Family history of ischemic heart disease and other diseases of the circulatory system: Secondary | ICD-10-CM

## 2020-06-08 DIAGNOSIS — F17210 Nicotine dependence, cigarettes, uncomplicated: Secondary | ICD-10-CM | POA: Diagnosis present

## 2020-06-08 DIAGNOSIS — I428 Other cardiomyopathies: Secondary | ICD-10-CM | POA: Diagnosis present

## 2020-06-08 DIAGNOSIS — F1020 Alcohol dependence, uncomplicated: Secondary | ICD-10-CM | POA: Diagnosis present

## 2020-06-08 DIAGNOSIS — Z9049 Acquired absence of other specified parts of digestive tract: Secondary | ICD-10-CM

## 2020-06-08 DIAGNOSIS — J9601 Acute respiratory failure with hypoxia: Secondary | ICD-10-CM | POA: Diagnosis present

## 2020-06-08 DIAGNOSIS — E785 Hyperlipidemia, unspecified: Secondary | ICD-10-CM | POA: Diagnosis present

## 2020-06-08 DIAGNOSIS — N179 Acute kidney failure, unspecified: Secondary | ICD-10-CM | POA: Diagnosis present

## 2020-06-08 DIAGNOSIS — F1210 Cannabis abuse, uncomplicated: Secondary | ICD-10-CM | POA: Diagnosis present

## 2020-06-08 DIAGNOSIS — I5043 Acute on chronic combined systolic (congestive) and diastolic (congestive) heart failure: Secondary | ICD-10-CM | POA: Diagnosis present

## 2020-06-08 DIAGNOSIS — Z515 Encounter for palliative care: Secondary | ICD-10-CM

## 2020-06-08 DIAGNOSIS — I4901 Ventricular fibrillation: Secondary | ICD-10-CM | POA: Diagnosis present

## 2020-06-08 DIAGNOSIS — J80 Acute respiratory distress syndrome: Secondary | ICD-10-CM

## 2020-06-08 DIAGNOSIS — W19XXXA Unspecified fall, initial encounter: Secondary | ICD-10-CM | POA: Diagnosis present

## 2020-06-08 DIAGNOSIS — I472 Ventricular tachycardia: Secondary | ICD-10-CM | POA: Diagnosis present

## 2020-06-08 DIAGNOSIS — R680 Hypothermia, not associated with low environmental temperature: Secondary | ICD-10-CM | POA: Diagnosis present

## 2020-06-08 DIAGNOSIS — I426 Alcoholic cardiomyopathy: Secondary | ICD-10-CM | POA: Diagnosis present

## 2020-06-08 DIAGNOSIS — R570 Cardiogenic shock: Secondary | ICD-10-CM | POA: Diagnosis present

## 2020-06-08 DIAGNOSIS — G931 Anoxic brain damage, not elsewhere classified: Secondary | ICD-10-CM | POA: Diagnosis present

## 2020-06-08 DIAGNOSIS — D72829 Elevated white blood cell count, unspecified: Secondary | ICD-10-CM | POA: Diagnosis present

## 2020-06-08 DIAGNOSIS — I214 Non-ST elevation (NSTEMI) myocardial infarction: Principal | ICD-10-CM | POA: Diagnosis present

## 2020-06-08 DIAGNOSIS — J9600 Acute respiratory failure, unspecified whether with hypoxia or hypercapnia: Secondary | ICD-10-CM

## 2020-06-08 DIAGNOSIS — I462 Cardiac arrest due to underlying cardiac condition: Secondary | ICD-10-CM | POA: Diagnosis present

## 2020-06-08 DIAGNOSIS — E781 Pure hyperglyceridemia: Secondary | ICD-10-CM | POA: Diagnosis present

## 2020-06-08 DIAGNOSIS — Z66 Do not resuscitate: Secondary | ICD-10-CM | POA: Diagnosis not present

## 2020-06-08 DIAGNOSIS — G40901 Epilepsy, unspecified, not intractable, with status epilepticus: Secondary | ICD-10-CM | POA: Diagnosis present

## 2020-06-08 DIAGNOSIS — J9602 Acute respiratory failure with hypercapnia: Secondary | ICD-10-CM | POA: Diagnosis present

## 2020-06-08 DIAGNOSIS — I110 Hypertensive heart disease with heart failure: Secondary | ICD-10-CM | POA: Diagnosis present

## 2020-06-08 DIAGNOSIS — I700 Atherosclerosis of aorta: Secondary | ICD-10-CM | POA: Diagnosis present

## 2020-06-08 DIAGNOSIS — Z20822 Contact with and (suspected) exposure to covid-19: Secondary | ICD-10-CM | POA: Diagnosis present

## 2020-06-08 DIAGNOSIS — Z79899 Other long term (current) drug therapy: Secondary | ICD-10-CM

## 2020-06-08 DIAGNOSIS — I469 Cardiac arrest, cause unspecified: Secondary | ICD-10-CM

## 2020-06-08 DIAGNOSIS — R7402 Elevation of levels of lactic acid dehydrogenase (LDH): Secondary | ICD-10-CM | POA: Diagnosis present

## 2020-06-08 DIAGNOSIS — Z87442 Personal history of urinary calculi: Secondary | ICD-10-CM

## 2020-06-08 DIAGNOSIS — E872 Acidosis: Secondary | ICD-10-CM | POA: Diagnosis present

## 2020-06-08 DIAGNOSIS — F419 Anxiety disorder, unspecified: Secondary | ICD-10-CM | POA: Diagnosis present

## 2020-06-08 DIAGNOSIS — Z880 Allergy status to penicillin: Secondary | ICD-10-CM

## 2020-06-08 LAB — CBC WITH DIFFERENTIAL/PLATELET
Abs Immature Granulocytes: 0.25 10*3/uL — ABNORMAL HIGH (ref 0.00–0.07)
Basophils Absolute: 0.1 10*3/uL (ref 0.0–0.1)
Basophils Relative: 1 %
Eosinophils Absolute: 0 10*3/uL (ref 0.0–0.5)
Eosinophils Relative: 0 %
HCT: 54.2 % — ABNORMAL HIGH (ref 39.0–52.0)
Hemoglobin: 16.9 g/dL (ref 13.0–17.0)
Immature Granulocytes: 2 %
Lymphocytes Relative: 32 %
Lymphs Abs: 5.2 10*3/uL — ABNORMAL HIGH (ref 0.7–4.0)
MCH: 33.5 pg (ref 26.0–34.0)
MCHC: 31.2 g/dL (ref 30.0–36.0)
MCV: 107.5 fL — ABNORMAL HIGH (ref 80.0–100.0)
Monocytes Absolute: 0.7 10*3/uL (ref 0.1–1.0)
Monocytes Relative: 4 %
Neutro Abs: 9.8 10*3/uL — ABNORMAL HIGH (ref 1.7–7.7)
Neutrophils Relative %: 61 %
Platelets: 270 10*3/uL (ref 150–400)
RBC: 5.04 MIL/uL (ref 4.22–5.81)
RDW: 15.1 % (ref 11.5–15.5)
WBC: 16 10*3/uL — ABNORMAL HIGH (ref 4.0–10.5)
nRBC: 0 % (ref 0.0–0.2)

## 2020-06-08 LAB — I-STAT ARTERIAL BLOOD GAS, ED
Acid-base deficit: 3 mmol/L — ABNORMAL HIGH (ref 0.0–2.0)
Bicarbonate: 22.7 mmol/L (ref 20.0–28.0)
Calcium, Ion: 1.07 mmol/L — ABNORMAL LOW (ref 1.15–1.40)
HCT: 50 % (ref 39.0–52.0)
Hemoglobin: 17 g/dL (ref 13.0–17.0)
O2 Saturation: 100 %
Patient temperature: 97.7
Potassium: 2.5 mmol/L — CL (ref 3.5–5.1)
Sodium: 140 mmol/L (ref 135–145)
TCO2: 24 mmol/L (ref 22–32)
pCO2 arterial: 39.2 mmHg (ref 32.0–48.0)
pH, Arterial: 7.367 (ref 7.350–7.450)
pO2, Arterial: 278 mmHg — ABNORMAL HIGH (ref 83.0–108.0)

## 2020-06-08 LAB — URINALYSIS, ROUTINE W REFLEX MICROSCOPIC
Bacteria, UA: NONE SEEN
Bilirubin Urine: NEGATIVE
Glucose, UA: 150 mg/dL — AB
Ketones, ur: 20 mg/dL — AB
Leukocytes,Ua: NEGATIVE
Nitrite: NEGATIVE
Protein, ur: >=300 mg/dL — AB
Specific Gravity, Urine: 1.013 (ref 1.005–1.030)
pH: 7 (ref 5.0–8.0)

## 2020-06-08 LAB — GLUCOSE, CAPILLARY
Glucose-Capillary: 104 mg/dL — ABNORMAL HIGH (ref 70–99)
Glucose-Capillary: 199 mg/dL — ABNORMAL HIGH (ref 70–99)

## 2020-06-08 LAB — BASIC METABOLIC PANEL WITH GFR
Anion gap: 20 — ABNORMAL HIGH (ref 5–15)
BUN: 10 mg/dL (ref 6–20)
CO2: 19 mmol/L — ABNORMAL LOW (ref 22–32)
Calcium: 8.7 mg/dL — ABNORMAL LOW (ref 8.9–10.3)
Chloride: 100 mmol/L (ref 98–111)
Creatinine, Ser: 1.42 mg/dL — ABNORMAL HIGH (ref 0.61–1.24)
GFR, Estimated: >60 mL/min
Glucose, Bld: 187 mg/dL — ABNORMAL HIGH (ref 70–99)
Potassium: 3.1 mmol/L — ABNORMAL LOW (ref 3.5–5.1)
Sodium: 139 mmol/L (ref 135–145)

## 2020-06-08 LAB — COMPREHENSIVE METABOLIC PANEL
ALT: 286 U/L — ABNORMAL HIGH (ref 0–44)
AST: 471 U/L — ABNORMAL HIGH (ref 15–41)
Albumin: 4.3 g/dL (ref 3.5–5.0)
Alkaline Phosphatase: 123 U/L (ref 38–126)
Anion gap: 28 — ABNORMAL HIGH (ref 5–15)
BUN: 7 mg/dL (ref 6–20)
CO2: 15 mmol/L — ABNORMAL LOW (ref 22–32)
Calcium: 9.2 mg/dL (ref 8.9–10.3)
Chloride: 100 mmol/L (ref 98–111)
Creatinine, Ser: 1.63 mg/dL — ABNORMAL HIGH (ref 0.61–1.24)
GFR, Estimated: 53 mL/min — ABNORMAL LOW (ref >60)
Glucose, Bld: 236 mg/dL — ABNORMAL HIGH (ref 70–99)
Potassium: 3.5 mmol/L (ref 3.5–5.1)
Sodium: 143 mmol/L (ref 135–145)
Total Bilirubin: 1.3 mg/dL — ABNORMAL HIGH (ref 0.3–1.2)
Total Protein: 8.2 g/dL — ABNORMAL HIGH (ref 6.5–8.1)

## 2020-06-08 LAB — I-STAT VENOUS BLOOD GAS, ED
Acid-base deficit: 11 mmol/L — ABNORMAL HIGH (ref 0.0–2.0)
Bicarbonate: 16.8 mmol/L — ABNORMAL LOW (ref 20.0–28.0)
Calcium, Ion: 1 mmol/L — ABNORMAL LOW (ref 1.15–1.40)
HCT: 53 % — ABNORMAL HIGH (ref 39.0–52.0)
Hemoglobin: 18 g/dL — ABNORMAL HIGH (ref 13.0–17.0)
O2 Saturation: 91 %
Potassium: 3.4 mmol/L — ABNORMAL LOW (ref 3.5–5.1)
Sodium: 141 mmol/L (ref 135–145)
TCO2: 18 mmol/L — ABNORMAL LOW (ref 22–32)
pCO2, Ven: 42.6 mmHg — ABNORMAL LOW (ref 44.0–60.0)
pH, Ven: 7.203 — ABNORMAL LOW (ref 7.250–7.430)
pO2, Ven: 75 mmHg — ABNORMAL HIGH (ref 32.0–45.0)

## 2020-06-08 LAB — MAGNESIUM: Magnesium: 2.5 mg/dL — ABNORMAL HIGH (ref 1.7–2.4)

## 2020-06-08 LAB — ABO/RH: ABO/RH(D): A POS

## 2020-06-08 LAB — CBC
HCT: 49.1 % (ref 39.0–52.0)
Hemoglobin: 16.5 g/dL (ref 13.0–17.0)
MCH: 33.7 pg (ref 26.0–34.0)
MCHC: 33.6 g/dL (ref 30.0–36.0)
MCV: 100.4 fL — ABNORMAL HIGH (ref 80.0–100.0)
Platelets: 284 10*3/uL (ref 150–400)
RBC: 4.89 MIL/uL (ref 4.22–5.81)
RDW: 15.1 % (ref 11.5–15.5)
WBC: 22.6 10*3/uL — ABNORMAL HIGH (ref 4.0–10.5)
nRBC: 0 % (ref 0.0–0.2)

## 2020-06-08 LAB — I-STAT CHEM 8, ED
BUN: 6 mg/dL (ref 6–20)
Calcium, Ion: 0.92 mmol/L — ABNORMAL LOW (ref 1.15–1.40)
Chloride: 104 mmol/L (ref 98–111)
Creatinine, Ser: 1.3 mg/dL — ABNORMAL HIGH (ref 0.61–1.24)
Glucose, Bld: 228 mg/dL — ABNORMAL HIGH (ref 70–99)
HCT: 53 % — ABNORMAL HIGH (ref 39.0–52.0)
Hemoglobin: 18 g/dL — ABNORMAL HIGH (ref 13.0–17.0)
Potassium: 3.5 mmol/L (ref 3.5–5.1)
Sodium: 141 mmol/L (ref 135–145)
TCO2: 18 mmol/L — ABNORMAL LOW (ref 22–32)

## 2020-06-08 LAB — HEMOGLOBIN A1C
Hgb A1c MFr Bld: 5.1 % (ref 4.8–5.6)
Mean Plasma Glucose: 99.67 mg/dL

## 2020-06-08 LAB — TYPE AND SCREEN
ABO/RH(D): A POS
Antibody Screen: NEGATIVE

## 2020-06-08 LAB — RAPID URINE DRUG SCREEN, HOSP PERFORMED
Amphetamines: NOT DETECTED
Barbiturates: NOT DETECTED
Benzodiazepines: NOT DETECTED
Cocaine: NOT DETECTED
Opiates: NOT DETECTED
Tetrahydrocannabinol: POSITIVE — AB

## 2020-06-08 LAB — PROTIME-INR
INR: 1.1 (ref 0.8–1.2)
Prothrombin Time: 14 seconds (ref 11.4–15.2)

## 2020-06-08 LAB — ETHANOL: Alcohol, Ethyl (B): <10 mg/dL (ref <10)

## 2020-06-08 LAB — TROPONIN I (HIGH SENSITIVITY)
Troponin I (High Sensitivity): 241 ng/L (ref <18)
Troponin I (High Sensitivity): 9095 ng/L (ref <18)
Troponin I (High Sensitivity): 13260 ng/L (ref <18)

## 2020-06-08 LAB — LACTIC ACID, PLASMA
Lactic Acid, Venous: 5.1 mmol/L (ref 0.5–1.9)
Lactic Acid, Venous: 3.7 mmol/L (ref 0.5–1.9)

## 2020-06-08 LAB — SARS CORONAVIRUS 2 BY RT PCR (HOSPITAL ORDER, PERFORMED IN ~~LOC~~ HOSPITAL LAB): SARS Coronavirus 2: NEGATIVE

## 2020-06-08 MED ORDER — MAGNESIUM SULFATE 2 GM/50ML IV SOLN
2.0000 g | Freq: Once | INTRAVENOUS | Status: AC
  Administered 2020-06-08: 2 g via INTRAVENOUS
  Filled 2020-06-08: qty 50

## 2020-06-08 MED ORDER — BUSPIRONE HCL 10 MG PO TABS: 30.0000 mg | ORAL_TABLET | Freq: Three times a day (TID) | ORAL | Status: DC

## 2020-06-08 MED ORDER — ACETAMINOPHEN 160 MG/5ML PO SOLN
650.0000 mg | ORAL | Status: DC
  Administered 2020-06-08 – 2020-06-12 (×22): 650 mg
  Filled 2020-06-08 (×22): qty 20.3

## 2020-06-08 MED ORDER — FENTANYL 2500MCG IN NS 250ML (10MCG/ML) PREMIX INFUSION
0.0000 ug/h | INTRAVENOUS | Status: DC
  Administered 2020-06-08: 100 ug/h via INTRAVENOUS
  Administered 2020-06-08: 150 ug/h via INTRAVENOUS
  Administered 2020-06-09: 125 ug/h via INTRAVENOUS
  Administered 2020-06-10: 100 ug/h via INTRAVENOUS
  Administered 2020-06-10 – 2020-06-11 (×3): 200 ug/h via INTRAVENOUS
  Administered 2020-06-12: 100 ug/h via INTRAVENOUS
  Filled 2020-06-08 (×7): qty 250

## 2020-06-08 MED ORDER — CHLORHEXIDINE GLUCONATE CLOTH 2 % EX PADS
6.0000 | MEDICATED_PAD | Freq: Every day | CUTANEOUS | Status: DC
  Administered 2020-06-08 – 2020-06-12 (×5): 6 via TOPICAL

## 2020-06-08 MED ORDER — PROPOFOL 1000 MG/100ML IV EMUL
5.0000 ug/kg/min | INTRAVENOUS | Status: DC
  Administered 2020-06-08: 20 ug/kg/min via INTRAVENOUS
  Administered 2020-06-08: 15 ug/kg/min via INTRAVENOUS
  Administered 2020-06-08: 20 ug/kg/min via INTRAVENOUS
  Administered 2020-06-09: 30 ug/kg/min via INTRAVENOUS
  Administered 2020-06-09: 45 ug/kg/min via INTRAVENOUS
  Administered 2020-06-09: 20 ug/kg/min via INTRAVENOUS
  Administered 2020-06-09 – 2020-06-10 (×2): 30 ug/kg/min via INTRAVENOUS
  Administered 2020-06-10: 25 ug/kg/min via INTRAVENOUS
  Administered 2020-06-10: 15 ug/kg/min via INTRAVENOUS
  Administered 2020-06-10: 20 ug/kg/min via INTRAVENOUS
  Administered 2020-06-11 (×2): 25 ug/kg/min via INTRAVENOUS
  Filled 2020-06-08 (×12): qty 100

## 2020-06-08 MED ORDER — INSULIN ASPART 100 UNIT/ML ~~LOC~~ SOLN
0.0000 [IU] | SUBCUTANEOUS | Status: DC
  Administered 2020-06-08: 4 [IU] via SUBCUTANEOUS
  Administered 2020-06-09 – 2020-06-12 (×3): 3 [IU] via SUBCUTANEOUS

## 2020-06-08 MED ORDER — AMIODARONE HCL IN DEXTROSE 360-4.14 MG/200ML-% IV SOLN
60.0000 mg/h | INTRAVENOUS | Status: AC
  Administered 2020-06-08: 60 mg/h via INTRAVENOUS
  Filled 2020-06-08: qty 200

## 2020-06-08 MED ORDER — CHLORHEXIDINE GLUCONATE CLOTH 2 % EX PADS: 6.0000 | MEDICATED_PAD | Freq: Every day | CUTANEOUS | Status: DC

## 2020-06-08 MED ORDER — FENTANYL CITRATE (PF) 100 MCG/2ML IJ SOLN: 50.0000 ug | Freq: Once | INTRAMUSCULAR | Status: AC

## 2020-06-08 MED ORDER — ACETAMINOPHEN 650 MG RE SUPP
650.0000 mg | RECTAL | Status: DC
  Filled 2020-06-08: qty 1

## 2020-06-08 MED ORDER — POTASSIUM CHLORIDE 20 MEQ PO PACK
40.0000 meq | PACK | Freq: Once | ORAL | Status: AC
  Administered 2020-06-08: 40 meq
  Filled 2020-06-08: qty 2

## 2020-06-08 MED ORDER — ROCURONIUM BROMIDE 50 MG/5ML IV SOLN
INTRAVENOUS | Status: AC | PRN
  Administered 2020-06-08: 100 mg via INTRAVENOUS

## 2020-06-08 MED ORDER — SODIUM CHLORIDE 0.9 % IV BOLUS
1000.0000 mL | Freq: Once | INTRAVENOUS | Status: AC
  Administered 2020-06-08: 1000 mL via INTRAVENOUS

## 2020-06-08 MED ORDER — SODIUM CHLORIDE 0.9% FLUSH
10.0000 mL | INTRAVENOUS | Status: DC | PRN
Start: 2020-06-08 — End: 2020-06-13

## 2020-06-08 MED ORDER — SUCCINYLCHOLINE CHLORIDE 20 MG/ML IJ SOLN
INTRAMUSCULAR | Status: AC | PRN
  Administered 2020-06-08: 100 mg via INTRAVENOUS

## 2020-06-08 MED ORDER — DOCUSATE SODIUM 100 MG PO CAPS: 100.0000 mg | ORAL_CAPSULE | Freq: Two times a day (BID) | ORAL | Status: DC | PRN

## 2020-06-08 MED ORDER — IOHEXOL 350 MG/ML SOLN
100.0000 mL | Freq: Once | INTRAVENOUS | Status: AC | PRN
  Administered 2020-06-08: 100 mL via INTRAVENOUS

## 2020-06-08 MED ORDER — PANTOPRAZOLE SODIUM 40 MG IV SOLR
40.0000 mg | Freq: Every day | INTRAVENOUS | Status: DC
  Administered 2020-06-08 – 2020-06-11 (×4): 40 mg via INTRAVENOUS
  Filled 2020-06-08 (×4): qty 40

## 2020-06-08 MED ORDER — AMIODARONE HCL IN DEXTROSE 360-4.14 MG/200ML-% IV SOLN
30.0000 mg/h | INTRAVENOUS | Status: DC
  Administered 2020-06-08 – 2020-06-11 (×6): 30 mg/h via INTRAVENOUS
  Filled 2020-06-08 (×5): qty 200

## 2020-06-08 MED ORDER — AMIODARONE LOAD VIA INFUSION
150.0000 mg | Freq: Once | INTRAVENOUS | Status: AC
  Administered 2020-06-08: 150 mg via INTRAVENOUS
  Filled 2020-06-08: qty 83.34

## 2020-06-08 MED ORDER — POTASSIUM CHLORIDE CRYS ER 20 MEQ PO TBCR: 40.0000 meq | EXTENDED_RELEASE_TABLET | Freq: Once | ORAL | Status: DC

## 2020-06-08 MED ORDER — ETOMIDATE 2 MG/ML IV SOLN
INTRAVENOUS | Status: AC | PRN
  Administered 2020-06-08: 20 mg via INTRAVENOUS

## 2020-06-08 MED ORDER — ORAL CARE MOUTH RINSE
15.0000 mL | OROMUCOSAL | Status: DC
  Administered 2020-06-08 – 2020-06-12 (×36): 15 mL via OROMUCOSAL

## 2020-06-08 MED ORDER — POLYETHYLENE GLYCOL 3350 17 G PO PACK: 17.0000 g | PACK | Freq: Every day | ORAL | Status: DC | PRN

## 2020-06-08 MED ORDER — MAGNESIUM SULFATE 2 GM/50ML IV SOLN: 2.0000 g | Freq: Once | INTRAVENOUS | Status: DC

## 2020-06-08 MED ORDER — ENOXAPARIN SODIUM 40 MG/0.4ML ~~LOC~~ SOLN
40.0000 mg | SUBCUTANEOUS | Status: DC
  Administered 2020-06-08: 40 mg via SUBCUTANEOUS
  Filled 2020-06-08: qty 0.4

## 2020-06-08 MED ORDER — CHLORHEXIDINE GLUCONATE 0.12% ORAL RINSE (MEDLINE KIT)
15.0000 mL | Freq: Two times a day (BID) | OROMUCOSAL | Status: DC
  Administered 2020-06-08 – 2020-06-11 (×8): 15 mL via OROMUCOSAL

## 2020-06-08 MED ORDER — SODIUM CHLORIDE 0.9 % IV SOLN: INTRAVENOUS | Status: DC | PRN

## 2020-06-08 MED ORDER — FENTANYL BOLUS VIA INFUSION
50.0000 ug | INTRAVENOUS | Status: DC | PRN
  Administered 2020-06-08 – 2020-06-09 (×2): 50 ug via INTRAVENOUS
  Filled 2020-06-08: qty 50

## 2020-06-08 MED ORDER — AMIODARONE HCL IN DEXTROSE 360-4.14 MG/200ML-% IV SOLN
INTRAVENOUS | Status: AC
  Administered 2020-06-08: 60 mg/h via INTRAVENOUS
  Filled 2020-06-08: qty 200

## 2020-06-08 MED ORDER — SODIUM CHLORIDE 0.9% FLUSH
10.0000 mL | Freq: Two times a day (BID) | INTRAVENOUS | Status: DC
  Administered 2020-06-08: 20 mL
  Administered 2020-06-09 – 2020-06-10 (×4): 10 mL
  Administered 2020-06-11: 30 mL
  Administered 2020-06-11: 10 mL
  Administered 2020-06-12: 30 mL

## 2020-06-08 MED ORDER — FENTANYL CITRATE (PF) 100 MCG/2ML IJ SOLN
100.0000 ug | Freq: Once | INTRAMUSCULAR | Status: AC
  Administered 2020-06-08: 100 ug via INTRAVENOUS

## 2020-06-08 MED ORDER — ACETAMINOPHEN 325 MG PO TABS: 650.0000 mg | ORAL_TABLET | ORAL | Status: DC

## 2020-06-08 MED ORDER — LIDOCAINE HCL (CARDIAC) PF 100 MG/5ML IV SOSY
PREFILLED_SYRINGE | INTRAVENOUS | Status: AC | PRN
  Administered 2020-06-08: 100 mg via INTRAVENOUS

## 2020-06-08 MED ORDER — EPINEPHRINE 0.1 MG/10ML (10 MCG/ML) SYRINGE FOR IV PUSH (FOR BLOOD PRESSURE SUPPORT)
PREFILLED_SYRINGE | INTRAVENOUS | Status: AC | PRN
  Administered 2020-06-08: 10 ug via INTRAVENOUS

## 2020-06-08 MED ORDER — FENTANYL CITRATE (PF) 100 MCG/2ML IJ SOLN
INTRAMUSCULAR | Status: AC
  Administered 2020-06-08: 50 ug
  Filled 2020-06-08: qty 2

## 2020-06-08 MED ORDER — HEPARIN BOLUS VIA INFUSION
2000.0000 [IU] | Freq: Once | INTRAVENOUS | Status: AC
  Administered 2020-06-08: 2000 [IU] via INTRAVENOUS
  Filled 2020-06-08: qty 2000

## 2020-06-08 MED ORDER — HEPARIN (PORCINE) 25000 UT/250ML-% IV SOLN
1450.0000 [IU]/h | INTRAVENOUS | Status: DC
  Administered 2020-06-08 – 2020-06-10 (×3): 1400 [IU]/h via INTRAVENOUS
  Filled 2020-06-08 (×6): qty 250

## 2020-06-08 MED ORDER — BUSPIRONE HCL 10 MG PO TABS
30.0000 mg | ORAL_TABLET | Freq: Three times a day (TID) | ORAL | Status: DC
  Administered 2020-06-08 – 2020-06-10 (×5): 30 mg
  Filled 2020-06-08 (×5): qty 3

## 2020-06-08 MED ORDER — POTASSIUM CHLORIDE 10 MEQ/50ML IV SOLN
10.0000 meq | INTRAVENOUS | Status: AC
  Administered 2020-06-08 – 2020-06-09 (×6): 10 meq via INTRAVENOUS
  Filled 2020-06-08 (×6): qty 50

## 2020-06-08 NOTE — Progress Notes (Signed)
   06-10-2020 1620  Clinical Encounter Type  Visited With Family  Visit Type Social support;Critical Care   Chaplain checked on Pt's partner, Carollee Herter, in the consult room. Chaplain facilitated communication between doctor and Pt's partner. Chaplain provided ministry of presence. When Chaplain left for another page, Pt's partner was still waiting to hear from medical staff. Chaplain remains available.    This note was prepared by Chaplain Resident, Tacy Learn, MDiv. Chaplain remains available as needed through the on-call pager: [phone].

## 2020-06-08 NOTE — Procedures (Signed)
Arterial Catheter Insertion Procedure Note  Derrick Hoffman  [id number]  [birth year]  Date:06/19/2020  Time:8:15 PM    Provider Performing: Lorin Glass    Procedure: Insertion of Arterial Line (07371) without US guidance  Indication(s) Blood pressure monitoring and/or need for frequent ABGs  Consent Unable to obtain consent due to emergent nature of procedure.  Anesthesia None   Time Out Verified patient identification, verified procedure, site/side was marked, verified correct patient position, special equipment/implants available, medications/allergies/relevant history reviewed, required imaging and test results available.   Sterile Technique Maximal sterile technique including full sterile barrier drape, hand hygiene, sterile gown, sterile gloves, mask, hair covering, sterile ultrasound probe cover (if used).   Procedure Description Area of catheter insertion was cleaned with chlorhexidine and draped in sterile fashion. Without real-time ultrasound guidance an arterial catheter was placed into the left radial artery.  Appropriate arterial tracings confirmed on monitor.     Complications/Tolerance None; patient tolerated the procedure well.   EBL Minimal   Specimen(s) None

## 2020-06-08 NOTE — ED Notes (Signed)
Pt arrives via ems from a hotel room where he was with a friend who reports he began c/o CP 2 hours and then collapsed friend began cpr called ems on fire arrival one shock given and on ems arrival PEA they continued CPR and administered a total of 4mg  epi and one shock for Vfib. Pulses returned. Pt arrives to ED with king airway in place, total downtime reported as 30 minutes.   Pt intubated on arrival.

## 2020-06-08 NOTE — Progress Notes (Addendum)
eLink Physician-Brief Progress Note Patient Name: PHILMORE LEPORE DOB: [birth year] MRN: [id number]   Date of Service  12-Jun-2020  HPI/Events of Note  1. Asked to follow up on K. Bedside has already ordered 100 meq potassium which is starting now (40 meq oral was given). 2 gram Mag was given. D/W RN who will let us know when repeat level resutls,. The IV form has only just started  2. Asked to speak with family about current plan and prognosis.   eICU Interventions  D/W family in detail over camera. Multiple questions answered. Father and patient's girlfriend were present as was bedside RN. 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

## 2020-06-08 NOTE — Procedures (Signed)
Central Venous Catheter Insertion Procedure Note  Derrick Hoffman  [id number]  28-Apr-1976  Date:06/17/2020  Time:6:08 PM   Provider Performing:Itzell Bendavid Salena Saner Katrinka Blazing   Procedure: Insertion of Non-tunneled Central Venous Catheter(36556) with US guidance (97989)   Indication(s) Medication administration  Consent Unable to obtain consent due to emergent nature of procedure.  Anesthesia Topical only with 1% lidocaine   Timeout Verified patient identification, verified procedure, site/side was marked, verified correct patient position, special equipment/implants available, medications/allergies/relevant history reviewed, required imaging and test results available.  Sterile Technique Maximal sterile technique including full sterile barrier drape, hand hygiene, sterile gown, sterile gloves, mask, hair covering, sterile ultrasound probe cover (if used).  Procedure Description Area of catheter insertion was cleaned with chlorhexidine and draped in sterile fashion.  With real-time ultrasound guidance a central venous catheter was placed into the left internal jugular vein. Nonpulsatile blood flow and easy flushing noted in all ports.  The catheter was sutured in place and sterile dressing applied.  Complications/Tolerance None; patient tolerated the procedure well. Chest X-ray is ordered to verify placement for internal jugular or subclavian cannulation.   Chest x-ray is not ordered for femoral cannulation.  EBL Minimal  Specimen(s) None

## 2020-06-08 NOTE — Progress Notes (Signed)
   June 21, 2020 1755  Clinical Encounter Type  Visited With Patient and family together  Visit Type Critical Care;Follow-up  Referral From Nurse  Consult/Referral To Chaplain  Spiritual Encounters  Spiritual Needs Prayer   Chaplain followed-up with Pt's partner, Carollee Herter, now at bedside. Pt's father also at bedside. Pt's father requested chaplain "keep praying for him." Chaplain told family how to contact chaplain if needed. Chaplain remains available.    This note was prepared by Chaplain Resident, Tacy Learn, MDiv. Chaplain remains available as needed through the on-call pager: [phone].

## 2020-06-08 NOTE — H&P (Addendum)
NAME:  Derrick Hoffman, MRN:  [id number], DOB:  1975-09-06, LOS: 0 ADMISSION DATE:  30-Jun-2020, CONSULTATION DATE:  06-30-20 REFERRING MD:  Dr.Rees, CHIEF COMPLAINT:  Cardiac arrest   Brief History:  45yo male admitted post cardiac arrest, estimated downtime ~29mins  History of Present Illness:  Derrick Hoffman is a 45 y.o. male with PMH significant for HTN, tobacco abuse, hepatic steatosis, and anxiety who presented to the ED via EMS after suffering outside hospital arrest. Patient was found in hotel room by friend complaining of chest pain. On EMS arrival patient was seen in [facility]. He received 4 rounds of Epi and one shock for Vfib before ROSC seen. King airway placed prior to ED arrival. Estimated downtime was approximately   Vitals on admission significant for hypothermia, tachypnea, tachycardia and hypertension. Labwork significant for low K, mild transaminitis. ABG hypoxic and hypercapnic respiratory failure  CXR negative. CT head negative. CT angio chest/ang/pelvis negative for acute dissection insufficient contrast to evaluate for PE. PCCM consulted for further management and admisison  Girlfriend who accompanies said he drank a fair bit last night but seemed fine until this afternoon when he began having crushing left chest pain, fell down and starting having frothing at mouth as well as seizure like activity.  CPR was started fairly quickly.  Does not do cocaine, does smoke marijuana fairly regularly.  Past Medical History:  Smoker HTN Steatohepatitis Anxiety Alcohol use  Significant Hospital Events:  June 30, 2020 admitted  Consults:  Cardiology  Procedures:    Significant Diagnostic Tests:  CXR 2/5 > negative  CT head 2/5 > negative  CT angio chest/ang/pelvis 2/5 > negative for acute dissection insufficient contrast to evaluate for PE  Micro Data:  COVID 2/5 >  Antimicrobials:     Interim History / Subjective:  As above   Objective   Blood pressure (!) 175/109,  pulse (!) 106, resp. rate 20, height 6\' 2"  (1.88 m), SpO2 100 %.    Vent Mode: PRVC FiO2 (%):  [60 %] 60 % Set Rate:  [16 bmp] 16 bmp Vt Set:  [650 mL] 650 mL PEEP:  [5 cmH20] 5 cmH20  No intake or output data in the 24 hours ending 06/30/2020 1639 There were no vitals filed for this visit.  Examination: General: ill appearing man sedated on vent HENT: ETT in place, minimal secretions Lungs: Clear, mechanical breath sounds, passive on vent Cardiovascular: irregular, tachy, strong pulses Abdomen: soft, hypoactive BS Extremities: no edema Neuro: gagging on tube and moving arms but not purposefully GU: clear urine in foley  COVID neg  Resolved Hospital Problem list     Assessment & Plan:  Cardiac arrest -Unknown etiology, concern for substance abuse, toxicology pending  -Start normothermia protocol, goal 36 degrees. -MAP goal > 65 -ECHO -Toxicology pending  -Trend troponin, lactate -Cardiology following, appreciate assistance   Respiratory insufficiency / Acute hypoxic respiratory failure -Secondary to arrest as above  -Continue ventilator support with lung protective strategies  -Wean PEEP and FiO2 for sats greater than 90%. -Head of bed elevated 30 degrees. -Plateau pressures less than 30 cm H20.  -Follow intermittent chest x-ray and ABG.   -Ensure adequate pulmonary hygiene  -Follow cultures  -VAP bundle in place  -PAD protocol  At risk for anoxic encephalopathy.-Approximate 08/06/20 downtime  -Delay formal prognosis until ~72 hours post arrest unless -vEEG  Recurrent Vtach/Vfib- lidocaine/amio gtt, replete K/Mg  Best practice (evaluated daily)  Diet: NPO Pain/Anxiety/Delirium protocol (if indicated): prop/fent VAP protocol (if indicated):  in place DVT prophylaxis: heparin gtt GI prophylaxis: ppi Glucose control: ssi Mobility: BR Disposition:ICU  Goals of Care:  Discussed with girlfriend who is communicating with family, full code/ full scope for now.  Labs    CBC: Recent Labs  Lab 06/25/2020 1545 06/25/2020 1546 06/07/2020 1553  WBC  --   --  16.0*  NEUTROABS  --   --  9.8*  HGB 18.0* 18.0* 16.9  HCT 53.0* 53.0* 54.2*  MCV  --   --  107.5*  PLT  --   --  270    Basic Metabolic Panel: Recent Labs  Lab 06/20/2020 1545 06/06/2020 1546 06/27/2020 1553  NA 141 141 143  K 3.4* 3.5 3.5  CL  --  104 100  CO2  --   --  15*  GLUCOSE  --  228* 236*  BUN  --  6 7  CREATININE  --  1.30* 1.63*  CALCIUM  --   --  9.2   GFR: CrCl cannot be calculated (Unknown ideal weight.). Recent Labs  Lab 06/25/2020 1553  WBC 16.0*    Liver Function Tests: Recent Labs  Lab 06/04/2020 1553  AST PENDING  ALT PENDING  ALKPHOS 123  BILITOT 1.3*  PROT 8.2*  ALBUMIN 4.3   No results for input(s): LIPASE, AMYLASE in the last 168 hours. No results for input(s): AMMONIA in the last 168 hours.  ABG    Component Value Date/Time   HCO3 16.8 (L) 06/07/2020 1545   TCO2 18 (L) 06/22/2020 1546   ACIDBASEDEF 11.0 (H) 06/28/2020 1545   O2SAT 91.0 06/23/2020 1545     Coagulation Profile: Recent Labs  Lab 06/11/2020 1553  INR 1.1    Cardiac Enzymes: No results for input(s): CKTOTAL, CKMB, CKMBINDEX, TROPONINI in the last 168 hours.  HbA1C: No results found for: HGBA1C  CBG: No results for input(s): GLUCAP in the last 168 hours.  Review of Systems:   Cannot assess, comatose  Past Medical History:  He,  has a past medical history of Abnormal liver function test, Anxiety, Aortic atherosclerosis (HCC), Childhood asthma, Habitual alcohol use, Hepatic steatosis, History of kidney stones (1990), Hypertension, Hypokalemia, and Tobacco abuse.   Surgical History:   Past Surgical History:  Procedure Laterality Date  . ANTERIOR CRUCIATE LIGAMENT REPAIR Right 1996  . APPENDECTOMY  01/05/2018  . BUNIONECTOMY Left 2013  . LAPAROSCOPIC APPENDECTOMY N/A 01/05/2018   Procedure: APPENDECTOMY LAPAROSCOPIC;  Surgeon: Griselda Miner, MD;  Location: [facility] OR;  Service:  General;  Laterality: N/A;  . LEFT HEART CATH AND CORONARY ANGIOGRAPHY N/A 09/25/2019   Procedure: LEFT HEART CATH AND CORONARY ANGIOGRAPHY;  Surgeon: Runell Gess, MD;  Location: [facility] INVASIVE CV LAB;  Service: Cardiovascular;  Laterality: N/A;  . ORIF TRIPOD FRACTURE N/A 11/03/2018   Procedure: OPEN REDUCTION INTERNAL FIXATION (ORIF) TRIPOD FRACTURE;  Surgeon: Suzanna Obey, MD;  Location: [facility] OR;  Service: ENT;  Laterality: N/A;  ORIF tripod fracture- malar fracture     Social History:   reports that he has been smoking. He has a 0.50 pack-year smoking history. He has never used smokeless tobacco. He reports current alcohol use of about 48.0 standard drinks of alcohol per week. He reports previous drug use.   Family History:  His family history includes CAD in his mother; Diabetes in his father.   Allergies Allergies  Allergen Reactions  . Penicillins Swelling    Did it involve swelling of the face/tongue/throat, SOB, or low BP? Y Did it involve  sudden or severe rash/hives, skin peeling, or any reaction on the inside of your mouth or nose? N Did you need to seek medical attention at a hospital or doctor's office? Y When did it last happen? 10 years If all above answers are "NO", may proceed with cephalosporin use.     Home Medications  Prior to Admission medications   Medication Sig Start Date End Date Taking? Authorizing Provider  carvedilol (COREG) 25 MG tablet Take 1 tablet (25 mg total) by mouth 2 (two) times daily with a meal. 03/14/20   Marcine Matar, MD  folic acid (FOLVITE) 1 MG tablet Take 1 tablet (1 mg total) by mouth daily. 03/05/20   Calvert Cantor, MD  ipratropium (ATROVENT HFA) 17 MCG/ACT inhaler Inhale into the lungs. 03/25/20   [provider]  Multiple Vitamin (MULTIVITAMIN WITH MINERALS) TABS tablet Take 1 tablet by mouth daily. 03/05/20   Calvert Cantor, MD  pantoprazole (PROTONIX) 40 MG tablet Take 1 tablet (40 mg total) by mouth daily. 03/05/20 03/05/21   Calvert Cantor, MD  sacubitril-valsartan (ENTRESTO) 49-51 MG Take 1 tablet by mouth 2 (two) times daily.    [provider]  spironolactone (ALDACTONE) 25 MG tablet Take 0.5 tablets (12.5 mg total) by mouth daily. 03/14/20   Marcine Matar, MD  thiamine 100 MG tablet Take 1 tablet (100 mg total) by mouth daily. 03/05/20   Calvert Cantor, MD     Critical care time: 41 minutes

## 2020-06-08 NOTE — Progress Notes (Signed)
ANTICOAGULATION CONSULT NOTE - Initial Consult  Pharmacy Consult for heparin Indication: chest pain/ACS  Allergies  Allergen Reactions  . Penicillins Swelling    Did it involve swelling of the face/tongue/throat, SOB, or low BP? Y Did it involve sudden or severe rash/hives, skin peeling, or any reaction on the inside of your mouth or nose? N Did you need to seek medical attention at a hospital or doctor's office? Y When did it last happen? 10 years If all above answers are "NO", may proceed with cephalosporin use.    Patient Measurements: Height: 6\' 2"  (188 cm) Weight: 100 kg (220 lb 7.4 oz) IBW/kg (Calculated) : 82.2 Heparin Dosing Weight: 100kg  Vital Signs: Temp: 97.2 F (36.2 C) (02/05 1830) BP: 115/79 (02/05 1830) Pulse Rate: 76 (02/05 1830)  Labs: Recent Labs    06/09/2020 1546 06/24/2020 1553 06/27/2020 1808 06/07/2020 1826  HGB 18.0* 16.9 17.0  --   HCT 53.0* 54.2* 50.0  --   PLT  --  270  --   --   LABPROT  --  14.0  --   --   INR  --  1.1  --   --   CREATININE 1.30* 1.63*  --   --   TROPONINIHS  --  241*  --  9,095*    Estimated Creatinine Clearance: 73 mL/min (A) (by C-G formula based on SCr of 1.63 mg/dL (H)).   Medical History: Past Medical History:  Diagnosis Date  . Abnormal liver function test   . Anxiety   . Aortic atherosclerosis (HCC)   . Childhood asthma   . Habitual alcohol use   . Hepatic steatosis   . History of kidney stones 1990  . Hypertension   . Hypokalemia   . Tobacco abuse    Assessment: 45 year old male admitted post cardiac arrest with approximately 30 minutes of downtime. Patient now with elevated hs troponin to >9k. Sq enoxaparin for prophylaxis given and new orders received to start IV heparin. Will decrease bolus accordingly. CBC within normal limits.   Goal of Therapy:  Heparin level 0.3-0.7 units/ml Monitor platelets by anticoagulation protocol: Yes   Plan:  Give 2000 units bolus x 1 Start heparin infusion at 1400  units/hr Check anti-Xa level in 8 hours and daily while on heparin Continue to monitor H&H and platelets  59 PharmD., BCPS Clinical Pharmacist 06/04/2020 7:24 PM

## 2020-06-08 NOTE — ED Provider Notes (Signed)
[facility] EMERGENCY DEPARTMENT Provider Note   CSN: [id number] Arrival date & time: 06/30/2020  1533     History Chief Complaint  Patient presents with  . Cardiac Arrest    Derrick Hoffman is a 45 y.o. male.  The history is provided by the EMS personnel and medical records.   Derrick Hoffman is a 45 y.o. male who presents to the Emergency Department complaining of cardiac arrest. Level V caveat due to unresponsiveness. History is provided by EMS. Per EMS they were called out for cardiac arrest. 911 was initially called when the patient experience chest pain and then he became unresponsive and bystander CPR was started. On fire arrival CPR was continued and the patient received one shock. On EMS arrival CPR was continued for a rhythm of PEA and the patient received four epis and one shock for V. fib. Following this the patient had return of circulation. King airway was placed by EMS prior to ED arrival. Approximate downtime is 30 minutes per EMS.    Past Medical History:  Diagnosis Date  . Abnormal liver function test   . Anxiety   . Aortic atherosclerosis ([place])   . Childhood asthma   . Habitual alcohol use   . Hepatic steatosis   . History of kidney stones 1990  . Hypertension   . Hypokalemia   . Tobacco abuse     Patient Active Problem List   Diagnosis Date Noted  . Abnormal LFTs 03/14/2020  . Essential hypertension 03/14/2020  . Influenza vaccination declined 03/14/2020  . COVID-19 vaccination declined 03/14/2020  . Hyperlipidemia 10/11/2019  . Alcohol abuse 09/27/2019  . Hypokalemia   . Hypophosphatemia   . Tick bite 09/25/2019  . NICM (nonischemic cardiomyopathy) ([place]) 09/25/2019  . Elevated troponin     Past Surgical History:  Procedure Laterality Date  . ANTERIOR CRUCIATE LIGAMENT REPAIR Right 1996  . APPENDECTOMY  01/05/2018  . BUNIONECTOMY Left 2013  . LAPAROSCOPIC APPENDECTOMY N/A 01/05/2018   Procedure: APPENDECTOMY LAPAROSCOPIC;  Surgeon:  Jovita Kussmaul, MD;  Location: [facility];  Service: General;  Laterality: N/A;  . LEFT HEART CATH AND CORONARY ANGIOGRAPHY N/A 09/25/2019   Procedure: LEFT HEART CATH AND CORONARY ANGIOGRAPHY;  Surgeon: Lorretta Harp, MD;  Location: [facility];  Service: Cardiovascular;  Laterality: N/A;  . ORIF TRIPOD FRACTURE N/A 11/03/2018   Procedure: OPEN REDUCTION INTERNAL FIXATION (ORIF) TRIPOD FRACTURE;  Surgeon: Melissa Montane, MD;  Location: [facility];  Service: ENT;  Laterality: N/A;  ORIF tripod fracture- malar fracture       Family History  Problem Relation Age of Onset  . CAD Mother        MI age 38  . Diabetes Father     Social History   Tobacco Use  . Smoking status: Current Every Day Smoker    Packs/day: 0.50    Years: 1.00    Pack years: 0.50  . Smokeless tobacco: Never Used  Vaping Use  . Vaping Use: Never used  Substance Use Topics  . Alcohol use: Yes    Alcohol/week: 48.0 standard drinks    Types: 48 Shots of liquor per week    Comment: 01/05/2018 " 5th of liquor/day;  3 days/wk  . Drug use: Not Currently    Home Medications Prior to Admission medications   Medication Sig Start Date End Date Taking? Authorizing Provider  carvedilol (COREG) 25 MG tablet Take 1 tablet (25 mg total) by mouth 2 (two) times  daily with a meal. 03/14/20   Ladell Pier, MD  folic acid (FOLVITE) 1 MG tablet Take 1 tablet (1 mg total) by mouth daily. 03/05/20   Debbe Odea, MD  ipratropium (ATROVENT HFA) 17 MCG/ACT inhaler Inhale into the lungs. 03/25/20   [provider]  Multiple Vitamin (MULTIVITAMIN WITH MINERALS) TABS tablet Take 1 tablet by mouth daily. 03/05/20   Debbe Odea, MD  pantoprazole (PROTONIX) 40 MG tablet Take 1 tablet (40 mg total) by mouth daily. 03/05/20 03/05/21  Debbe Odea, MD  sacubitril-valsartan (ENTRESTO) 49-51 MG Take 1 tablet by mouth 2 (two) times daily.    [provider]  spironolactone (ALDACTONE) 25 MG tablet Take 0.5 tablets (12.5 mg  total) by mouth daily. 03/14/20   Ladell Pier, MD  thiamine 100 MG tablet Take 1 tablet (100 mg total) by mouth daily. 03/05/20   Debbe Odea, MD    Allergies    Penicillins  Review of Systems   Review of Systems  Unable to perform ROS: Patient unresponsive    Physical Exam Updated Vital Signs BP (!) 120/107   Pulse 96   Temp 99.5 F (37.5 C) (Bladder)   Resp 15   Ht 6' 2"  (1.88 m)   Wt 100.5 kg   SpO2 94%   BMI 28.45 kg/m   Physical Exam Vitals and nursing note reviewed.  Constitutional:      Appearance: He is well-developed and well-nourished.  HENT:     Head: Normocephalic and atraumatic.  Eyes:     Comments: Pupils midsize bilaterally  Cardiovascular:     Rate and Rhythm: Regular rhythm. Tachycardia present.     Heart sounds: No murmur heard.   Pulmonary:     Effort: Pulmonary effort is normal. No respiratory distress.     Breath sounds: Normal breath sounds.     Comments: Spontaneous respirations with King airway in place. Good air movement bilaterally Abdominal:     Palpations: Abdomen is soft.     Tenderness: There is no abdominal tenderness. There is no guarding or rebound.  Musculoskeletal:        General: No tenderness or edema.  Skin:    General: Skin is warm and dry.  Neurological:     Mental Status: He is alert.     Comments: GCS 1-1-1, spontaneous respirations  Psychiatric:        Mood and Affect: Mood and affect normal.     Comments: Unable to assess     ED Results / Procedures / Treatments   Labs (all labs ordered are listed, but only abnormal results are displayed) Labs Reviewed  COMPREHENSIVE METABOLIC PANEL - Abnormal; Notable for the following components:      Result Value   CO2 15 (*)    Glucose, Bld 236 (*)    Creatinine, Ser 1.63 (*)    Total Protein 8.2 (*)    AST 471 (*)    ALT 286 (*)    Total Bilirubin 1.3 (*)    GFR, Estimated 53 (*)    Anion gap 28 (*)    All other components within normal limits  CBC WITH  DIFFERENTIAL/PLATELET - Abnormal; Notable for the following components:   WBC 16.0 (*)    HCT 54.2 (*)    MCV 107.5 (*)    Neutro Abs 9.8 (*)    Lymphs Abs 5.2 (*)    Abs Immature Granulocytes 0.25 (*)    All other components within normal limits  RAPID URINE DRUG  SCREEN, HOSP PERFORMED - Abnormal; Notable for the following components:   Tetrahydrocannabinol POSITIVE (*)    All other components within normal limits  URINALYSIS, ROUTINE W REFLEX MICROSCOPIC - Abnormal; Notable for the following components:   APPearance HAZY (*)    Glucose, UA 150 (*)    Hgb urine dipstick SMALL (*)    Ketones, ur 20 (*)    Protein, ur >=300 (*)    All other components within normal limits  LACTIC ACID, PLASMA - Abnormal; Notable for the following components:   Lactic Acid, Venous 5.1 (*)    All other components within normal limits  LACTIC ACID, PLASMA - Abnormal; Notable for the following components:   Lactic Acid, Venous 3.7 (*)    All other components within normal limits  GLUCOSE, CAPILLARY - Abnormal; Notable for the following components:   Glucose-Capillary 199 (*)    All other components within normal limits  CBC - Abnormal; Notable for the following components:   WBC 22.6 (*)    MCV 100.4 (*)    All other components within normal limits  BASIC METABOLIC PANEL - Abnormal; Notable for the following components:   Potassium 3.1 (*)    CO2 19 (*)    Glucose, Bld 187 (*)    Creatinine, Ser 1.42 (*)    Calcium 8.7 (*)    Anion gap 20 (*)    All other components within normal limits  MAGNESIUM - Abnormal; Notable for the following components:   Magnesium 2.5 (*)    All other components within normal limits  GLUCOSE, CAPILLARY - Abnormal; Notable for the following components:   Glucose-Capillary 104 (*)    All other components within normal limits  I-STAT VENOUS BLOOD GAS, ED - Abnormal; Notable for the following components:   pH, Ven 7.203 (*)    pCO2, Ven 42.6 (*)    pO2, Ven 75.0 (*)     Bicarbonate 16.8 (*)    TCO2 18 (*)    Acid-base deficit 11.0 (*)    Potassium 3.4 (*)    Calcium, Ion 1.00 (*)    HCT 53.0 (*)    Hemoglobin 18.0 (*)    All other components within normal limits  I-STAT CHEM 8, ED - Abnormal; Notable for the following components:   Creatinine, Ser 1.30 (*)    Glucose, Bld 228 (*)    Calcium, Ion 0.92 (*)    TCO2 18 (*)    Hemoglobin 18.0 (*)    HCT 53.0 (*)    All other components within normal limits  I-STAT ARTERIAL BLOOD GAS, ED - Abnormal; Notable for the following components:   pO2, Arterial 278 (*)    Acid-base deficit 3.0 (*)    Potassium 2.5 (*)    Calcium, Ion 1.07 (*)    All other components within normal limits  TROPONIN I (HIGH SENSITIVITY) - Abnormal; Notable for the following components:   Troponin I (High Sensitivity) 241 (*)    All other components within normal limits  TROPONIN I (HIGH SENSITIVITY) - Abnormal; Notable for the following components:   Troponin I (High Sensitivity) 9,095 (*)    All other components within normal limits  TROPONIN I (HIGH SENSITIVITY) - Abnormal; Notable for the following components:   Troponin I (High Sensitivity) 13,260 (*)    All other components within normal limits  SARS CORONAVIRUS 2 BY RT PCR (HOSPITAL ORDER, [facility])  MRSA PCR SCREENING  PROTIME-INR  ETHANOL  HEMOGLOBIN A1C  TRIGLYCERIDES  BLOOD GAS, ARTERIAL  CBC  BASIC METABOLIC PANEL  MAGNESIUM  PHOSPHORUS  HEPARIN LEVEL (UNFRACTIONATED)  TYPE AND SCREEN  ABO/RH  TROPONIN I (HIGH SENSITIVITY)    EKG EKG Interpretation  Date/Time:  Saturday June 08 2020 15:36:11 EST Ventricular Rate:  147 PR Interval:    QRS Duration: 109 QT Interval:  323 QTC Calculation: 506 R Axis:   -55 Text Interpretation: Sinus tachycardia Probable left atrial enlargement Left anterior fascicular block LVH with secondary repolarization abnormality Anterior Q waves, possibly due to LVH ST depr, consider  ischemia, inferior leads Prolonged QT interval Confirmed by Quintella Reichert [phone]) on 06/17/2020 4:13:37 PM   Radiology CT Head Wo Contrast  Result Date: 07/01/2020 CLINICAL DATA:  Headache.  Intracranial hemorrhage suspected. EXAM: CT HEAD WITHOUT CONTRAST TECHNIQUE: Contiguous axial images were obtained from the base of the skull through the vertex without intravenous contrast. COMPARISON:  None. FINDINGS: Brain: No evidence of acute infarction, hemorrhage, hydrocephalus, extra-axial collection or mass lesion/mass effect. Vascular: Hyperdense vessels likely due to administration of contrast for concurrent chest CT angiogram. Skull: Normal. Negative for fracture or focal lesion. Sinuses/Orbits: Partial opacification of the ethmoid sinuses. Other: None. IMPRESSION: 1. No acute intracranial abnormality. 2. Hyperdense intracranial vessels likely due to administration of contrast for concurrent chest CT angiogram. Electronically Signed   By: Fidela Salisbury M.D.   On: 06/25/2020 16:34   DG Chest Portable 1 View  Result Date: 06/06/2020 CLINICAL DATA:  Post intubation. EXAM: PORTABLE CHEST 1 VIEW COMPARISON:  March 03, 2020 FINDINGS: There has been an interval intubation. The endotracheal tube tip in satisfactory position. Cardiomediastinal silhouette is normal. Mediastinal contours appear intact. There is no evidence of focal airspace consolidation, pleural effusion or pneumothorax. Osseous structures are without acute abnormality. Soft tissues are grossly normal. IMPRESSION: 1. Interval intubation. 2. No evidence of acute cardiopulmonary disease. Electronically Signed   By: Fidela Salisbury M.D.   On: 06/13/2020 15:54   CT Angio Chest/Abd/Pel for Dissection W and/or W/WO  Result Date: 06/06/2020 CLINICAL DATA:  Chest pain, loss of consciousness EXAM: CT ANGIOGRAPHY CHEST, ABDOMEN AND PELVIS TECHNIQUE: Non-contrast CT of the chest was initially obtained. Multidetector CT imaging through the chest,  abdomen and pelvis was performed using the standard protocol during bolus administration of intravenous contrast. Multiplanar reconstructed images and MIPs were obtained and reviewed to evaluate the vascular anatomy. CONTRAST:  140m OMNIPAQUE IOHEXOL 350 MG/ML SOLN COMPARISON:  03/03/2020 FINDINGS: CTA CHEST FINDINGS Cardiovascular: No evidence of thoracic aortic aneurysm or dissection. Common origin of the innominate artery and left common carotid artery from the aortic arch. Mild cardiomegaly unchanged. No pericardial effusion. Insufficient pulmonary arterial contrast enhancement to evaluate for pulmonary emboli. Mediastinum/Nodes: Endotracheal tube well above carina. Enteric catheter extends into the gastric lumen. No pathologic adenopathy. Lungs/Pleura: Dependent lower lobe atelectasis. No acute airspace disease, effusion, or pneumothorax. Central airways are patent. Musculoskeletal: No acute or destructive bony lesions. Reconstructed images demonstrate no additional findings. Review of the MIP images confirms the above findings. CTA ABDOMEN AND PELVIS FINDINGS VASCULAR Aorta: Normal caliber aorta without aneurysm, dissection, vasculitis or significant stenosis. Celiac: Patent without evidence of aneurysm, dissection, vasculitis or significant stenosis. SMA: Patent without evidence of aneurysm, dissection, vasculitis or significant stenosis. Renals: Both renal arteries are patent without evidence of aneurysm, dissection, vasculitis, fibromuscular dysplasia or significant stenosis. There is an accessory left renal artery supplying the lower pole, with mild narrowing at its origin unchanged since previous. IMA: Patent without evidence of aneurysm, dissection,  vasculitis or significant stenosis. Inflow: Patent without evidence of aneurysm, dissection, vasculitis or significant stenosis. Minimal atherosclerosis. Veins: No obvious venous abnormality within the limitations of this arterial phase study. Review of the  MIP images confirms the above findings. NON-VASCULAR Hepatobiliary: Diffuse hepatic steatosis unchanged. No focal liver abnormality. Gallbladder is unremarkable. Pancreas: Unremarkable. No pancreatic ductal dilatation or surrounding inflammatory changes. Spleen: Normal in size without focal abnormality. Adrenals/Urinary Tract: Adrenal glands are unremarkable. Kidneys are normal, without renal calculi, focal lesion, or hydronephrosis. Bladder is unremarkable. Stomach/Bowel: No bowel obstruction or ileus. No bowel wall thickening or inflammatory change. The appendix is surgically absent. Lymphatic: No pathologic adenopathy. Reproductive: Prostate is unremarkable. Other: No free fluid or free gas. No abdominal wall hernia. Musculoskeletal: No acute or destructive bony lesions. Reconstructed images demonstrate no additional findings. Review of the MIP images confirms the above findings. IMPRESSION: 1. No evidence of thoracic or abdominal aortic aneurysm or dissection. 2. Insufficient pulmonary arterial contrast enhancement to evaluate for pulmonary emboli. 3. Diffuse hepatic steatosis. Electronically Signed   By: Randa Ngo M.D.   On: 06/16/2020 16:36    Procedures Procedure Name: Intubation Date/Time: 06/11/2020 4:10 PM Performed by: Quintella Reichert, MD Pre-anesthesia Checklist: Patient identified, Patient being monitored, Emergency Drugs available, Timeout performed and Suction available Oxygen Delivery Method: Ambu bag Preoxygenation: Pre-oxygenation with 100% oxygen Induction Type: Rapid sequence Laryngoscope Size: Glidescope Tube size: 8.0 mm Number of attempts: 1 Placement Confirmation: ETT inserted through vocal cords under direct vision,  CO2 detector and Breath sounds checked- equal and bilateral Secured at: 27 cm Tube secured with: ETT holder Dental Injury: Teeth and Oropharynx as per pre-operative assessment  Comments: Ventilations were continued with king airway in place while RSI drugs  given. Following paralysis king airway removed and patient intubated with 8-0.  There was slight difficulty advancing the deflated balloon through the cords.       Orogastric tube placed following intubation, 16 french.  Confirmed with xray.  Patient tolerated procedure well.      CRITICAL CARE Performed by: Quintella Reichert   Total critical care time: 45 minutes  Critical care time was exclusive of separately billable procedures and treating other patients.  Critical care was necessary to treat or prevent imminent or life-threatening deterioration.  Critical care was time spent personally by me on the following activities: development of treatment plan with patient and/or surrogate as well as nursing, discussions with consultants, evaluation of patient's response to treatment, examination of patient, obtaining history from patient or surrogate, ordering and performing treatments and interventions, ordering and review of laboratory studies, ordering and review of radiographic studies, pulse oximetry and re-evaluation of patient's condition.  Medications Ordered in ED Medications  fentaNYL 2572mg in NS 2564m(1039mml) infusion-PREMIX (125 mcg/hr Intravenous Infusion Verify 06/09/20 0100)  fentaNYL (SUBLIMAZE) bolus via infusion 50 mcg (50 mcg Intravenous Bolus from Bag 07/01/2020 2228)  amiodarone (NEXTERONE) 1.8 mg/mL load via infusion 150 mg (150 mg Intravenous Bolus from Bag 06/06/2020 1653)    Followed by  amiodarone (NEXTERONE PREMIX) 360-4.14 MG/200ML-% (1.8 mg/mL) IV infusion (0 mg/hr Intravenous Stopped 06/19/2020 2255)    Followed by  amiodarone (NEXTERONE PREMIX) 360-4.14 MG/200ML-% (1.8 mg/mL) IV infusion (30 mg/hr Intravenous Infusion Verify 06/09/20 0100)  docusate sodium (COLACE) capsule 100 mg (has no administration in time range)  polyethylene glycol (MIRALAX / GLYCOLAX) packet 17 g (has no administration in time range)  propofol (DIPRIVAN) 1000 MG/100ML infusion (25 mcg/kg/min  100 kg  Intravenous Infusion Verify 06/09/20 0100)  potassium chloride 10 mEq in 50 mL *CENTRAL LINE* IVPB (10 mEq Intravenous New Bag/Given 06/09/20 0107)  insulin aspart (novoLOG) injection 0-20 Units (0 Units Subcutaneous Not Given 06/25/2020 2341)  pantoprazole (PROTONIX) injection 40 mg (40 mg Intravenous Given 06/14/2020 2104)  heparin ADULT infusion 100 units/mL (25000 units/293m) (1,400 Units/hr Intravenous Infusion Verify 06/09/20 0100)  chlorhexidine gluconate (MEDLINE KIT) (PERIDEX) 0.12 % solution 15 mL (15 mLs Mouth Rinse Given 06/06/2020 2000)  MEDLINE mouth rinse (15 mLs Mouth Rinse Given 06/07/2020 2332)  sodium chloride flush (NS) 0.9 % injection 10-40 mL (20 mLs Intracatheter Given 06/24/2020 2238)  sodium chloride flush (NS) 0.9 % injection 10-40 mL (has no administration in time range)  Chlorhexidine Gluconate Cloth 2 % PADS 6 each (6 each Topical Given 06/29/2020 2102)  0.9 %  sodium chloride infusion ( Intravenous Infusion Verify 06/09/20 0100)  acetaminophen (TYLENOL) tablet 650 mg ( Oral See Alternative 06/11/2020 2238)    Or  acetaminophen (TYLENOL) 160 MG/5ML solution 650 mg (650 mg Per Tube Given 06/06/2020 2238)    Or  acetaminophen (TYLENOL) suppository 650 mg ( Rectal See Alternative 06/26/2020 2238)  busPIRone (BUSPAR) tablet 30 mg ( Oral See Alternative 06/05/2020 2238)    Or  busPIRone (BUSPAR) tablet 30 mg (30 mg Per Tube Given 06/30/2020 2238)  etomidate (AMIDATE) injection (20 mg Intravenous Given 06/17/2020 1538)  succinylcholine (ANECTINE) injection (100 mg Intravenous Given 06/19/2020 1538)  sodium chloride 0.9 % bolus 1,000 mL (0 mLs Intravenous Stopped 07/01/2020 1900)  magnesium sulfate IVPB 2 g 50 mL (0 g Intravenous Stopped 06/24/2020 1701)  fentaNYL (SUBLIMAZE) injection 100 mcg (100 mcg Intravenous Given 07/01/2020 1633)  iohexol (OMNIPAQUE) 350 MG/ML injection 100 mL (100 mLs Intravenous Contrast Given 06/29/2020 1628)  fentaNYL (SUBLIMAZE) injection 50 mcg (50 mcg Intravenous Given 06/05/2020 1702)  lidocaine (cardiac) 100  mg/566m(XYLOCAINE) injection 2% (100 mg Intravenous Given 06/26/2020 1701)  rocuronium (ZEMURON) injection (100 mg Intravenous Given 07/01/2020 1701)  EPINEPhrine 10 mcg/mL Adult IV Push Syringe (For Blood Pressure Support) (10 mcg Intravenous Given 06/24/2020 1659)  heparin bolus via infusion 2,000 Units (2,000 Units Intravenous Bolus from Bag 06/15/2020 2132)  potassium chloride (KLOR-CON) packet 40 mEq (40 mEq Per Tube Given 07/01/2020 2028)    ED Course  I have reviewed the triage vital signs and the nursing notes.  Pertinent labs & imaging results that were available during my care of the patient were reviewed by me and considered in my medical decision making (see chart for details).    MDM Rules/Calculators/A&P                         patient presented to the emergency department status post cardiac arrest with return of circulation. He was different related for V. fib by EMS prior to return of circulation. On ED presentation patient hypertensive. King airway was replaced with endotracheal tube, required RSI for two placement due to rate of breathing. CT head as well as CTA dissection protocol obtained due to significant hypertension. CT is negative for acute abnormality. Cardiology emergently consulted for V. fib arrest, will see the patient in consult. Critical care consulted for admission. During ED stay patient had V tach and received shock times one and CPR was initiated. On pulse check patient with pedal pulses. This occurred just after receiving magnesium and amiodarone bolus. Will treat with additional dose of magnesium, adding lidocaine. Critical care where change in status, ongoing plan to admit for ongoing treatment.  Final Clinical Impression(s) / ED Diagnoses Final diagnoses:  Cardiac arrest [facility])    Rx / [place] Orders ED Discharge Orders    None       Quintella Reichert, MD 06/09/20 [phone]

## 2020-06-08 NOTE — ED Notes (Signed)
Pt arrives 

## 2020-06-08 NOTE — ED Notes (Signed)
Pt went into Vtach pulseless began shocked one and began CPR upon pulse check sinus tach.. see chart fr meds given during code, total downtime about 2 minutes

## 2020-06-08 NOTE — Consult Note (Signed)
Cardiology Consult    Patient ID: Derrick Hoffman; [id number]; 02-12-1976   Admit date: 06/20/2020 Date of Consult: 06/27/2020  Primary Care Provider: Marcine Matar, MD Primary Cardiologist: Nanetta Batty, MD   Patient Profile    Derrick Hoffman is a 45 y.o. male with past medical history of chronic combined systolic and diastolic CHF/NICM (EF 25-30% by echo in 09/2019 with cath showing normal cors, EF similar by repeat echo in 03/2020), alcohol abuse and tobacco use who is being seen today for the evaluation of cardiac arrest at the request of Dr. Madilyn Hook.   History of Present Illness    Derrick Hoffman was examined by Azalee Course, NP in 04/2020 and denied any recent symptoms but reported being without all of his cardiac medications for over 5 months. He was also without insurance and was applying for patient assistance. Samples were provided of Entresto 49-51mg  BID and he was continued on Coreg 25mg  BID and Spironolactone 12.5mg  daily.   The patient presented to ED this afternoon after having collapsed in a hotel room. By review of notes, he had reported chest pain for 2 hours and then collapsed. A friend started CPR and called EMS and he was in PEA upon arrival. Received 4mg  Epi and one shock for VF then has ROSC. Reported total down time of 30 minutes.   Initial labs showed WBC 16.0, Hgb 16.9, platelets 270, Na+ 143, K+ 3.5 and creatinine 1.63. Initial HS Troponin 241 with repeat values pending. Ethanol < 10. COVID pending. CXR with no active cardiopulmonary disease. CT Head with no acute intracranial abnormalities. CTA showing no evidence of thoracic or abdominal aortic aneurysm or dissection.   Past Medical History:  Diagnosis Date  . Abnormal liver function test   . Anxiety   . Aortic atherosclerosis (HCC)   . Childhood asthma   . Habitual alcohol use   . Hepatic steatosis   . History of kidney stones 1990  . Hypertension   . Hypokalemia   . Tobacco abuse     Past Surgical  History:  Procedure Laterality Date  . ANTERIOR CRUCIATE LIGAMENT REPAIR Right 1996  . APPENDECTOMY  01/05/2018  . BUNIONECTOMY Left 2013  . LAPAROSCOPIC APPENDECTOMY N/A 01/05/2018   Procedure: APPENDECTOMY LAPAROSCOPIC;  Surgeon: 2014, MD;  Location: [facility] OR;  Service: General;  Laterality: N/A;  . LEFT HEART CATH AND CORONARY ANGIOGRAPHY N/A 09/25/2019   Procedure: LEFT HEART CATH AND CORONARY ANGIOGRAPHY;  Surgeon: [facility], MD;  Location: [facility] INVASIVE CV LAB;  Service: Cardiovascular;  Laterality: N/A;  . ORIF TRIPOD FRACTURE N/A 11/03/2018   Procedure: OPEN REDUCTION INTERNAL FIXATION (ORIF) TRIPOD FRACTURE;  Surgeon: Runell Gess, MD;  Location: [facility] OR;  Service: ENT;  Laterality: N/A;  ORIF tripod fracture- malar fracture     Home Medications:  Prior to Admission medications   Medication Sig Start Date End Date Taking? Authorizing Provider  carvedilol (COREG) 25 MG tablet Take 1 tablet (25 mg total) by mouth 2 (two) times daily with a meal. 03/14/20   [facility], MD  folic acid (FOLVITE) 1 MG tablet Take 1 tablet (1 mg total) by mouth daily. 03/05/20   Marcine Matar, MD  ipratropium (ATROVENT HFA) 17 MCG/ACT inhaler Inhale into the lungs. 03/25/20   [provider]  Multiple Vitamin (MULTIVITAMIN WITH MINERALS) TABS tablet Take 1 tablet by mouth daily. 03/05/20   03/27/20, MD  pantoprazole (PROTONIX) 40 MG tablet Take 1 tablet (  40 mg total) by mouth daily. 03/05/20 03/05/21  Calvert Cantor, MD  sacubitril-valsartan (ENTRESTO) 49-51 MG Take 1 tablet by mouth 2 (two) times daily.    [provider]  spironolactone (ALDACTONE) 25 MG tablet Take 0.5 tablets (12.5 mg total) by mouth daily. 03/14/20   Marcine Matar, MD  thiamine 100 MG tablet Take 1 tablet (100 mg total) by mouth daily. 03/05/20   Calvert Cantor, MD    Inpatient Medications: Scheduled Meds: . fentaNYL       Continuous Infusions: . magnesium sulfate bolus IVPB     PRN  Meds: etomidate, succinylcholine  Allergies:    Allergies  Allergen Reactions  . Penicillins Swelling    Did it involve swelling of the face/tongue/throat, SOB, or low BP? Y Did it involve sudden or severe rash/hives, skin peeling, or any reaction on the inside of your mouth or nose? N Did you need to seek medical attention at a hospital or doctor's office? Y When did it last happen? 10 years If all above answers are "NO", may proceed with cephalosporin use.    Social History:   Social History   Socioeconomic History  . Marital status: Married    Spouse name: Not on file  . Number of children: 0  . Years of education: Not on file  . Highest education level: Bachelor's degree (e.g., BA, AB, BS)  Occupational History  . Occupation: unemployed  Tobacco Use  . Smoking status: Current Every Day Smoker    Packs/day: 0.50    Years: 1.00    Pack years: 0.50  . Smokeless tobacco: Never Used  Vaping Use  . Vaping Use: Never used  Substance and Sexual Activity  . Alcohol use: Yes    Alcohol/week: 48.0 standard drinks    Types: 48 Shots of liquor per week    Comment: 01/05/2018 " 5th of liquor/day;  3 days/wk  . Drug use: Not Currently  . Sexual activity: Not Currently  Other Topics Concern  . Not on file  Social History Narrative  . Not on file   Social Determinants of Health   Financial Resource Strain: Not on file  Food Insecurity: Not on file  Transportation Needs: Not on file  Physical Activity: Not on file  Stress: Not on file  Social Connections: Not on file  Intimate Partner Violence: Not on file     Family History:    Family History  Problem Relation Age of Onset  . CAD Mother        MI age 48  . Diabetes Father       Review of Systems    Not able to be obtained.   Physical Exam/Data    Vitals:   06/11/2020 1600 06/19/2020 1615 06/05/2020 1625 06/05/2020 1631  BP: (!) 172/110 (!) 170/114 (!) 175/109   Pulse: (!) 108 (!) 105 (!) 107 (!) 106  Resp: (!) 24  17 (!) 21 20  SpO2: 100% 100% 100% 100%  Height:       No intake or output data in the 24 hours ending 06/14/2020 1637 There were no vitals filed for this visit. Body mass index is 27.86 kg/m.   General: Intubated and sedated.  Psych: Unable to be assessed.  Neuro: Sedated.  HEENT: Normal  Neck: Supple without bruits or JVD. Lungs:  Resp regular and unlabored. Intubated.  Heart: RRR no s3, s4, or murmurs. Abdomen: Soft, non-tender, non-distended, BS + x 4.  Extremities: No clubbing, cyanosis or edema. DP/PT/Radials 2+  and equal bilaterally.   EKG:  The EKG was personally reviewed and demonstrates: Sinus tachycardia, HR 147 with LVH and associated repol abnormalities along the inferior and anterior leads.   Labs/Studies     Relevant CV Studies:  Cardiac Catheterization: 09/2019 IMPRESSION: Mr. Quevedo has clean coronary arteries, moderate elevation of LVEDP and severe LV dysfunction consistent with a nonischemic cardiomyopathy potentially related to hypertensive heart disease and/or excessive alcohol intake.  Guideline directed optimal medical therapy will be recommended including carvedilol, Entresto, spironolactone as he has already been recommended in the chart.  Cardiac risk factor modification including smoking cessation and avoidance of illicit drugs including alcohol.  The sheath was removed and a TR band was placed on the right wrist to achieve patent hemostasis.  The patient left lab in stable condition.  I am concerned that medical compliance will be an issue.  Echocardiogram: 03/05/2020 IMPRESSIONS    1. Left ventricular ejection fraction, by estimation, is 25 to 30%. The  left ventricle has severely decreased function. The left ventricle  demonstrates global hypokinesis with regional variation including mid to  distal anterior, apical and inferior  akinesis. The left ventricular internal cavity size was mildly dilated.  There is mild left ventricular hypertrophy. Left  ventricular diastolic  parameters are consistent with Grade II diastolic dysfunction  (pseudonormalization). Elevated left ventricular  end-diastolic pressure. The E/e' is 16.  2. Right ventricular systolic function is mildly reduced. The right  ventricular size is normal.  3. Left atrial size was mildly dilated.  4. The mitral valve is grossly normal. Mild mitral valve regurgitation.  5. The aortic valve is tricuspid. Aortic valve regurgitation is trivial.  6. Aortic dilatation noted. There is mild dilatation of the aortic root,  measuring 39 mm.  7. The inferior vena cava is normal in size with greater than 50%  respiratory variability, suggesting right atrial pressure of 3 mmHg.  8. Possible PFO by color doppler.   Comparison(s): No significant change from prior study. 09/25/2019: LVEF  25-30%, global HK and anteroseptal wall akinesis, grade 2 DD.    Laboratory Data:  Chemistry Recent Labs  Lab 12-Jun-2020 1545 06/26/2020 1546  NA 141 141  K 3.4* 3.5  CL  --  104  GLUCOSE  --  228*  BUN  --  6  CREATININE  --  1.30*    No results for input(s): PROT, ALBUMIN, AST, ALT, ALKPHOS, BILITOT in the last 168 hours. Hematology Recent Labs  Lab 06/21/2020 1545 06/09/2020 1546 06/20/2020 1553  WBC  --   --  16.0*  RBC  --   --  5.04  HGB 18.0* 18.0* 16.9  HCT 53.0* 53.0* 54.2*  MCV  --   --  107.5*  MCH  --   --  33.5  MCHC  --   --  31.2  RDW  --   --  15.1  PLT  --   --  270   Cardiac EnzymesNo results for input(s): TROPONINI in the last 168 hours. No results for input(s): TROPIPOC in the last 168 hours.  BNPNo results for input(s): BNP, PROBNP in the last 168 hours.  DDimer No results for input(s): DDIMER in the last 168 hours.  Radiology/Studies:  CT Head Wo Contrast  Result Date: Jun 12, 2020 CLINICAL DATA:  Headache.  Intracranial hemorrhage suspected. EXAM: CT HEAD WITHOUT CONTRAST TECHNIQUE: Contiguous axial images were obtained from the base of the skull through the  vertex without intravenous contrast. COMPARISON:  None. FINDINGS: Brain: No  evidence of acute infarction, hemorrhage, hydrocephalus, extra-axial collection or mass lesion/mass effect. Vascular: Hyperdense vessels likely due to administration of contrast for concurrent chest CT angiogram. Skull: Normal. Negative for fracture or focal lesion. Sinuses/Orbits: Partial opacification of the ethmoid sinuses. Other: None. IMPRESSION: 1. No acute intracranial abnormality. 2. Hyperdense intracranial vessels likely due to administration of contrast for concurrent chest CT angiogram. Electronically Signed   By: Ted Mcalpine M.D.   On: 06/14/2020 16:34   DG Chest Portable 1 View  Result Date: 06-14-2020 CLINICAL DATA:  Post intubation. EXAM: PORTABLE CHEST 1 VIEW COMPARISON:  March 03, 2020 FINDINGS: There has been an interval intubation. The endotracheal tube tip in satisfactory position. Cardiomediastinal silhouette is normal. Mediastinal contours appear intact. There is no evidence of focal airspace consolidation, pleural effusion or pneumothorax. Osseous structures are without acute abnormality. Soft tissues are grossly normal. IMPRESSION: 1. Interval intubation. 2. No evidence of acute cardiopulmonary disease. Electronically Signed   By: Ted Mcalpine M.D.   On: 2020-06-14 15:54     Assessment & Plan    1. Cardiac Arrest - He presented with a PEA arrest and received 4mg  Epi and one shock for VF then has ROSC. Reported total down time of 30 minutes. Pending neurologic recovery, will determine the indication for further cardiac evaluation. Given normal cors by prior cath last year, would not anticipate an emergent catheterization. A repeat echo will be obtained. He has been started on IV Amiodarone and will continue for now. Further management in regards to cooling protocol per Critical Care.   2. Chronic Combined Systolic and Diastolic CHF/NICM - EF was at by echo in 09/2019 with cath  showing normal cors, EF similar by repeat echo in 03/2020.  - Will plan to obtain repeat imaging this admission. Restart goal-directed medical therapy as allowed. Previously noncompliant with medical therapy as outlined above and it appears cost was an issue.    For questions or updates, please contact [facility] Please consult [email or web address] for contact info under Cardiology/STEMI.  Signed, 04/2020, PA-C 06/14/20, 4:37 PM Pager: [phone]

## 2020-06-09 ENCOUNTER — Inpatient Hospital Stay (HOSPITAL_COMMUNITY): Payer: Self-pay

## 2020-06-09 ENCOUNTER — Other Ambulatory Visit (HOSPITAL_COMMUNITY): Payer: Self-pay

## 2020-06-09 ENCOUNTER — Other Ambulatory Visit: Payer: Self-pay

## 2020-06-09 ENCOUNTER — Encounter (HOSPITAL_COMMUNITY): Payer: Self-pay | Admitting: Internal Medicine

## 2020-06-09 DIAGNOSIS — I469 Cardiac arrest, cause unspecified: Secondary | ICD-10-CM

## 2020-06-09 DIAGNOSIS — I25119 Atherosclerotic heart disease of native coronary artery with unspecified angina pectoris: Secondary | ICD-10-CM

## 2020-06-09 DIAGNOSIS — G253 Myoclonus: Secondary | ICD-10-CM

## 2020-06-09 DIAGNOSIS — B182 Chronic viral hepatitis C: Secondary | ICD-10-CM

## 2020-06-09 DIAGNOSIS — J80 Acute respiratory distress syndrome: Secondary | ICD-10-CM

## 2020-06-09 LAB — POCT I-STAT 7, (LYTES, BLD GAS, ICA,H+H)
Acid-base deficit: 5 mmol/L — ABNORMAL HIGH (ref 0.0–2.0)
Bicarbonate: 21 mmol/L (ref 20.0–28.0)
Calcium, Ion: 1.1 mmol/L — ABNORMAL LOW (ref 1.15–1.40)
HCT: 53 % — ABNORMAL HIGH (ref 39.0–52.0)
Hemoglobin: 18 g/dL — ABNORMAL HIGH (ref 13.0–17.0)
O2 Saturation: 100 %
Patient temperature: 36.8
Potassium: 4.9 mmol/L (ref 3.5–5.1)
Sodium: 141 mmol/L (ref 135–145)
TCO2: 22 mmol/L (ref 22–32)
pCO2 arterial: 40.3 mmHg (ref 32.0–48.0)
pH, Arterial: 7.324 — ABNORMAL LOW (ref 7.350–7.450)
pO2, Arterial: 285 mmHg — ABNORMAL HIGH (ref 83.0–108.0)

## 2020-06-09 LAB — TRIGLYCERIDES: Triglycerides: 324 mg/dL — ABNORMAL HIGH (ref <150)

## 2020-06-09 LAB — CBC
HCT: 50.3 % (ref 39.0–52.0)
Hemoglobin: 17.2 g/dL — ABNORMAL HIGH (ref 13.0–17.0)
MCH: 34.6 pg — ABNORMAL HIGH (ref 26.0–34.0)
MCHC: 34.2 g/dL (ref 30.0–36.0)
MCV: 101.2 fL — ABNORMAL HIGH (ref 80.0–100.0)
Platelets: 281 10*3/uL (ref 150–400)
RBC: 4.97 MIL/uL (ref 4.22–5.81)
RDW: 15.6 % — ABNORMAL HIGH (ref 11.5–15.5)
WBC: 20.3 10*3/uL — ABNORMAL HIGH (ref 4.0–10.5)
nRBC: 0 % (ref 0.0–0.2)

## 2020-06-09 LAB — ECHOCARDIOGRAM COMPLETE
AR max vel: 2.75 cm2
AV Area VTI: 2.4 cm2
AV Area mean vel: 2.3 cm2
AV Mean grad: 2 mmHg
AV Peak grad: 3.1 mmHg
Ao pk vel: 0.89 m/s
Area-P 1/2: 5.13 cm2
Height: 74 in
MV VTI: 1.97 cm2
S' Lateral: 5.6 cm
Weight: 3545 oz

## 2020-06-09 LAB — HEPARIN LEVEL (UNFRACTIONATED)
Heparin Unfractionated: 0.57 IU/mL (ref 0.30–0.70)
Heparin Unfractionated: 0.68 IU/mL (ref 0.30–0.70)

## 2020-06-09 LAB — MRSA PCR SCREENING: MRSA by PCR: NEGATIVE

## 2020-06-09 LAB — TROPONIN I (HIGH SENSITIVITY): Troponin I (High Sensitivity): 18068 ng/L (ref <18)

## 2020-06-09 LAB — BASIC METABOLIC PANEL
Anion gap: 15 (ref 5–15)
BUN: 11 mg/dL (ref 6–20)
CO2: 20 mmol/L — ABNORMAL LOW (ref 22–32)
Calcium: 9.1 mg/dL (ref 8.9–10.3)
Chloride: 105 mmol/L (ref 98–111)
Creatinine, Ser: 1.57 mg/dL — ABNORMAL HIGH (ref 0.61–1.24)
GFR, Estimated: 55 mL/min — ABNORMAL LOW (ref >60)
Glucose, Bld: 126 mg/dL — ABNORMAL HIGH (ref 70–99)
Potassium: 4.8 mmol/L (ref 3.5–5.1)
Sodium: 140 mmol/L (ref 135–145)

## 2020-06-09 LAB — GLUCOSE, CAPILLARY
Glucose-Capillary: 104 mg/dL — ABNORMAL HIGH (ref 70–99)
Glucose-Capillary: 109 mg/dL — ABNORMAL HIGH (ref 70–99)
Glucose-Capillary: 112 mg/dL — ABNORMAL HIGH (ref 70–99)
Glucose-Capillary: 115 mg/dL — ABNORMAL HIGH (ref 70–99)
Glucose-Capillary: 139 mg/dL — ABNORMAL HIGH (ref 70–99)
Glucose-Capillary: 90 mg/dL (ref 70–99)
Glucose-Capillary: 92 mg/dL (ref 70–99)

## 2020-06-09 LAB — PHOSPHORUS: Phosphorus: 4.4 mg/dL (ref 2.5–4.6)

## 2020-06-09 LAB — MAGNESIUM: Magnesium: 2.7 mg/dL — ABNORMAL HIGH (ref 1.7–2.4)

## 2020-06-09 MED ORDER — LEVETIRACETAM IN NACL 1000 MG/100ML IV SOLN: 1000.0000 mg | Freq: Two times a day (BID) | INTRAVENOUS | Status: DC

## 2020-06-09 MED ORDER — MIDAZOLAM HCL 2 MG/2ML IJ SOLN
2.0000 mg | INTRAMUSCULAR | Status: DC | PRN
  Administered 2020-06-10: 2 mg via INTRAVENOUS
  Filled 2020-06-09: qty 2

## 2020-06-09 MED ORDER — VALPROATE SODIUM 100 MG/ML IV SOLN
1500.0000 mg | Freq: Once | INTRAVENOUS | Status: AC
  Administered 2020-06-09: 1500 mg via INTRAVENOUS
  Filled 2020-06-09: qty 15

## 2020-06-09 MED ORDER — LEVETIRACETAM IN NACL 1500 MG/100ML IV SOLN
1500.0000 mg | Freq: Two times a day (BID) | INTRAVENOUS | Status: DC
  Administered 2020-06-10 – 2020-06-12 (×5): 1500 mg via INTRAVENOUS
  Filled 2020-06-09 (×7): qty 100

## 2020-06-09 MED ORDER — PERFLUTREN LIPID MICROSPHERE
1.0000 mL | INTRAVENOUS | Status: AC | PRN
  Administered 2020-06-09: 3 mL via INTRAVENOUS
  Filled 2020-06-09: qty 10

## 2020-06-09 MED ORDER — VITAL HIGH PROTEIN PO LIQD
1000.0000 mL | ORAL | Status: DC
  Administered 2020-06-09: 1000 mL

## 2020-06-09 MED ORDER — NOREPINEPHRINE 16 MG/250ML-% IV SOLN
0.0000 ug/min | INTRAVENOUS | Status: DC
  Administered 2020-06-09: 2 ug/min via INTRAVENOUS
  Filled 2020-06-09: qty 250

## 2020-06-09 MED ORDER — SODIUM CHLORIDE 0.9 % IV SOLN
2000.0000 mg | INTRAVENOUS | Status: AC
  Administered 2020-06-09: 2000 mg via INTRAVENOUS
  Filled 2020-06-09: qty 20

## 2020-06-09 MED ORDER — LORAZEPAM 2 MG/ML IJ SOLN
2.0000 mg | Freq: Once | INTRAMUSCULAR | Status: AC
  Administered 2020-06-09: 2 mg via INTRAVENOUS
  Filled 2020-06-09: qty 1

## 2020-06-09 MED ORDER — PROSOURCE TF PO LIQD
45.0000 mL | Freq: Two times a day (BID) | ORAL | Status: DC
  Administered 2020-06-09 – 2020-06-10 (×3): 45 mL
  Filled 2020-06-09 (×3): qty 45

## 2020-06-09 MED ORDER — LACTATED RINGERS IV SOLN: INTRAVENOUS | Status: AC

## 2020-06-09 NOTE — Consult Note (Addendum)
Neurology Consultation  Reason for Consult: ? Seizure activity Referring Physician: Dr. Erskine Emery, PCCM  CC: eye fasiculations/facial twitching  History is obtained from: chart  HPI: Derrick Hoffman is a 45 y.o. male with a PMHx of HTN, fatty liver, anxiety and tobacco abuse who came to ED after suffering cardiac arrest. He was found in a hotel room by a friend and was c/o CP. On EMS arrival, patient was PEA. Down time estimated at 30 mins. Patient had 4 rounds of Epi and one shock for Vfib before ROSC. Per patient's girlfriend, he had drank the night before and when he fell down he had frothing at the mouth and shaking. She started CPR while EMS in route.   Today, on PCCM exam, patient was thought to be in myoclonus with upward gaze preference and eyelid twitching. EEG was ordered which showed a pattern on the ictal-interictal continuum, with features concerning for an ictal nature to the discharges. We were called by our neurologist at [facility] who read EEG to clinically correlate these findings.   ROS: unable to perform due to unresponsiveness  Past Medical History:  Diagnosis Date  . Abnormal liver function test   . Anxiety   . Aortic atherosclerosis ([place])   . Childhood asthma   . Habitual alcohol use   . Hepatic steatosis   . History of kidney stones 1990  . Hypertension   . Hypokalemia   . Tobacco abuse     Family History  Problem Relation Age of Onset  . CAD Mother        MI age 32  . Diabetes Father     Social History:   reports that he has been smoking. He has a 0.50 pack-year smoking history. He has never used smokeless tobacco. He reports current alcohol use of about 48.0 standard drinks of alcohol per week. He reports previous drug use. Smokes marijuana  Medications  Current Facility-Administered Medications:  .  0.9 %  sodium chloride infusion, , Intravenous, PRN, Jacky Kindle, MD, Last Rate: 10 mL/hr at 06/09/20 1000, Infusion Verify at 06/09/20 1000 .   acetaminophen (TYLENOL) tablet 650 mg, 650 mg, Oral, Q4H **OR** acetaminophen (TYLENOL) 160 MG/5ML solution 650 mg, 650 mg, Per Tube, Q4H, 650 mg at 06/09/20 1341 **OR** acetaminophen (TYLENOL) suppository 650 mg, 650 mg, Rectal, Q4H, Candee Furbish, MD .  [COMPLETED] amiodarone (NEXTERONE) 1.8 mg/mL load via infusion 150 mg, 150 mg, Intravenous, Once, 150 mg at 06/14/2020 1653 **FOLLOWED BY** [EXPIRED] amiodarone (NEXTERONE PREMIX) 360-4.14 MG/200ML-% (1.8 mg/mL) IV infusion, 60 mg/hr, Intravenous, Continuous, Stopped at 06/16/2020 2255 **FOLLOWED BY** amiodarone (NEXTERONE PREMIX) 360-4.14 MG/200ML-% (1.8 mg/mL) IV infusion, 30 mg/hr, Intravenous, Continuous, Candee Furbish, MD, Last Rate: 16.67 mL/hr at 06/09/20 1510, 30 mg/hr at 06/09/20 1510 .  busPIRone (BUSPAR) tablet 30 mg, 30 mg, Oral, Q8H **OR** busPIRone (BUSPAR) tablet 30 mg, 30 mg, Per Tube, Q8H, Candee Furbish, MD, 30 mg at 06/09/20 1341 .  chlorhexidine gluconate (MEDLINE KIT) (PERIDEX) 0.12 % solution 15 mL, 15 mL, Mouth Rinse, BID, Candee Furbish, MD, 15 mL at 06/09/20 0745 .  Chlorhexidine Gluconate Cloth 2 % PADS 6 each, 6 each, Topical, Daily, Candee Furbish, MD, 6 each at 06/05/2020 2102 .  docusate sodium (COLACE) capsule 100 mg, 100 mg, Oral, BID PRN, Jacky Kindle, MD .  feeding supplement (PROSource TF) liquid 45 mL, 45 mL, Per Tube, BID, Candee Furbish, MD, 45 mL at 06/09/20 1022 .  feeding supplement (VITAL HIGH  PROTEIN) liquid 1,000 mL, 1,000 mL, Per Tube, Q24H, Candee Furbish, MD, 1,000 mL at 06/09/20 1034 .  fentaNYL (SUBLIMAZE) bolus via infusion 50 mcg, 50 mcg, Intravenous, Q15 min PRN, Quintella Reichert, MD, 50 mcg at 06/09/20 0204 .  fentaNYL 2558mg in NS 2523m(1017mml) infusion-PREMIX, 0-400 mcg/hr, Intravenous, Continuous, Chand, Sudham, MD, Last Rate: 12.5 mL/hr at 06/09/20 1400, 125 mcg/hr at 06/09/20 1400 .  heparin ADULT infusion 100 units/mL (25000 units/250m7m1,400 Units/hr, Intravenous, Continuous, WilsLyndee LeoH,[facility] Rate: 14 mL/hr at 06/09/20 1400, 1,400 Units/hr at 06/09/20 1400 .  insulin aspart (novoLOG) injection 0-20 Units, 0-20 Units, Subcutaneous, Q4H, SmitCandee Furbish, 3 Units at 06/09/20 1117 .  lactated ringers infusion, , Intravenous, Continuous, SmitCandee Furbish, Last Rate: 100 mL/hr at 06/09/20 1400, Infusion Verify at 06/09/20 1400 .  levETIRAcetam (KEPPRA) 2,000 mg in sodium chloride 0.9 % 250 mL IVPB, 2,000 mg, Intravenous, STAT, SmitCandee Furbish .  [STADerrill Memo2/11/2020] levETIRAcetam (KEPPRA) IVPB 1000 mg/100 mL premix, 1,000 mg, Intravenous, Q12H, SmitCandee Furbish .  MEDLINE mouth rinse, 15 mL, Mouth Rinse, 10 times per day, SmitCandee Furbish, 15 mL at 06/09/20 1512 .  midazolam (VERSED) injection 2 mg, 2 mg, Intravenous, Q2H PRN, Kamat, Sunil G, MD .  norepinephrine (LEVOPHED) 16 mg in 250mL6mmix infusion, 0-40 mcg/min, Intravenous, Titrated, SmithCandee Furbish Stopped at 06/09/20 1106 .  pantoprazole (PROTONIX) injection 40 mg, 40 mg, Intravenous, QHS, SmithCandee Furbish 40 mg at 06/25/2020 2104 .  polyethylene glycol (MIRALAX / GLYCOLAX) packet 17 g, 17 g, Oral, Daily PRN, Chand, Sudham, MD .  propofol (DIPRIVAN) 1000 MG/100ML infusion, 5-80 mcg/kg/min, Intravenous, Titrated, Chand, Sudham, MD, Last Rate: 15 mL/hr at 06/09/20 1400, 25 mcg/kg/min at 06/09/20 1400 .  sodium chloride flush (NS) 0.9 % injection 10-40 mL, 10-40 mL, Intracatheter, Q12H, SmithCandee Furbish 10 mL at 06/09/20 0957 .  sodium chloride flush (NS) 0.9 % injection 10-40 mL, 10-40 mL, Intracatheter, PRN, SmithCandee Furbish Exam: Current vital signs: BP 111/83   Pulse 75   Temp 98.6 F (37 C)   Resp 15   Ht 6' 2"  (1.88 m)   Wt 100.5 kg   SpO2 100%   BMI 28.45 kg/m  Vital signs in last 24 hours: Temp:  [96.4 F (35.8 C)-99.5 F (37.5 C)] 98.6 F (37 C) (02/06 1500) Pulse Rate:  [51-129] 75 (02/06 1500) Resp:  [14-31] 15 (02/06 1500) BP: (86-220)/(70-148) 111/83 (02/06  1500) SpO2:  [94 %-100 %] 100 % (02/06 1500) Arterial Line BP: (75-171)/(59-115) 88/81 (02/06 1500) FiO2 (%):  [40 %-100 %] 40 % (02/06 1200) Weight:  [100 kg-100.5 kg] 100.5 kg (02/06 0205)  GENERAL:Critically ill male intubated and ventilated. Unresponsive.  HEENT: - Normocephalic and atraumatic LUNGS - SaO2 100% on ventilator CV - RRR ABDOMEN - Soft, nontender Psych: unresponsive Ext: warm, well perfused  GCS:    Best motor   1        Best verbal     1          Best eye    1         Total-3  NEURO:  Mental Status: Unresponsive. Comatose. Speech/Language: non verbal, intubated Cranial Nerves:  II: pinpoint. Difficult to discern any reaction to light. No blink to threat.  III, IV, VI: Eyes midline, gaze is upward on sedation.    V, VII: No corneal reflexes.  VIII: no response to voice or loud clapping.  IX, X: no cough or gag reflex XI: head midline XII: intubated Motor: No response to noxious stimuli.  Tone is normal. Bulk is normal.  Sensation-Does not withdraw to pain.  Gait- deferred  Exam on 25 mcg of Propofol and Fentanyl at 125-mild chewing noted. No other twitching.  Exam on 0 of propofol with continued Fentanyl-increased chewing noted with coordinated fasciculation of temporal area. Eye batting noted.  Exam on 30 mcg of Propofol and Fentanyl-  Labs I have reviewed labs in epic and the results pertinent to this consultation are: All labs reviewed on chart.   CBC    Component Value Date/Time   WBC 20.3 (H) 06/09/2020 0400   RBC 4.97 06/09/2020 0400   HGB 18.0 (H) 06/09/2020 0721   HCT 53.0 (H) 06/09/2020 0721   PLT 281 06/09/2020 0400   MCV 101.2 (H) 06/09/2020 0400   MCH 34.6 (H) 06/09/2020 0400   MCHC 34.2 06/09/2020 0400   RDW 15.6 (H) 06/09/2020 0400   LYMPHSABS 5.2 (H) 06/07/2020 1553   MONOABS 0.7 06/07/2020 1553   EOSABS 0.0 06/04/2020 1553   BASOSABS 0.1 06/25/2020 1553    CMP     Component Value Date/Time   NA 141 06/09/2020 0721   K 4.9  06/09/2020 0721   CL 105 06/09/2020 0400   CO2 20 (L) 06/09/2020 0400   GLUCOSE 126 (H) 06/09/2020 0400   BUN 11 06/09/2020 0400   CREATININE 1.57 (H) 06/09/2020 0400   CALCIUM 9.1 06/09/2020 0400   PROT 8.2 (H) 06/06/2020 1553   ALBUMIN 4.3 06/09/2020 1553   AST 471 (H) 06/13/2020 1553   ALT 286 (H) 06/28/2020 1553   ALKPHOS 123 06/07/2020 1553   BILITOT 1.3 (H) 06/26/2020 1553   GFRNONAA 55 (L) 06/09/2020 0400   GFRAA >60 09/28/2019 0327    Lipid Panel     Component Value Date/Time   CHOL 287 (H) 09/26/2019 0416   TRIG 324 (H) 06/09/2020 0400   HDL 33 (L) 09/26/2019 0416   CHOLHDL 8.7 09/26/2019 0416   VLDL 32 09/26/2019 0416   LDLCALC 222 (H) 09/26/2019 0416   Imaging 06/04/2020 [facility] 1. No acute intracranial abnormality. 2. Hyperdense intracranial vessels likely due to administration of contrast for concurrent chest CT angiogram.  Assessment: 44 yo male who fell to floor in a hotel room and had frothing at the mouth with shaking noted. He went unresponsive and girlfriend started CPR. When EMS arrived, patient was PEA and ROSC was achieved after 4 rounds of Epi and one shock for VF. Estimated down time 30 minutes, although per girlfriend's story, it may have been longer. Neurology was asked to consult for possible myoclonus as patient was noted to have eye fasciculations. EEG was placed with above results. Throughout the day, we have reviewed EEG. Patient noted on EEG to have chewing motion.    We examined the patient on low dose sedation, completely off sedation and on high dose sedation and there was no change in the chewing and blinking frequency. His motor manifestations were not time-locked with periodic discharges on the EEG . On assessment while on increased dose of Propofol, there was no noted motor changes and there was no change in frequency of periodic discharges on EEG.  Impression: 1. Anoxic brain injury. 2. Post-hypoxic myoclonus.  3. Propriospinal myoclonus 4.  Coma 5. Lack of brainstem reflexes.  Recommendations: -Continue LTM EEG for further monitoring of discharges.  -Load Keppra 2  gms IV and will reassess EEG. If there are any changes suggesting improvement will consider continuing.  - Will continue to monitor.  This patient is critically ill and at significant risk of neurological worsening, death and care requires constant monitoring of vital signs, hemodynamics,respiratory and cardiac monitoring, neurological assessment, discussion with family, other specialists and medical decision making of high complexity. I spent 73 minutes of neurocritical care time  in the care of  this patient. This was time spent independent of any time provided by nurse practitioner or PA.   Lynnae Sandhoff, MD Page: [phone]

## 2020-06-09 NOTE — Progress Notes (Signed)
Progress Note  Patient Name: Derrick Hoffman Date of Encounter: 06/09/2020  Primary Cardiologist: Quay Burow, MD   Subjective   NAEO.   Inpatient Medications    Scheduled Meds: . acetaminophen  650 mg Oral Q4H   Or  . acetaminophen (TYLENOL) oral liquid 160 mg/5 mL  650 mg Per Tube Q4H   Or  . acetaminophen  650 mg Rectal Q4H  . busPIRone  30 mg Oral Q8H   Or  . busPIRone  30 mg Per Tube Q8H  . chlorhexidine gluconate (MEDLINE KIT)  15 mL Mouth Rinse BID  . Chlorhexidine Gluconate Cloth  6 each Topical Daily  . insulin aspart  0-20 Units Subcutaneous Q4H  . mouth rinse  15 mL Mouth Rinse 10 times per day  . pantoprazole (PROTONIX) IV  40 mg Intravenous QHS  . sodium chloride flush  10-40 mL Intracatheter Q12H   Continuous Infusions: . sodium chloride 10 mL/hr at 06/09/20 0800  . amiodarone 30 mg/hr (06/09/20 0800)  . fentaNYL infusion INTRAVENOUS 125 mcg/hr (06/09/20 0800)  . heparin 1,400 Units/hr (06/09/20 0800)  . norepinephrine (LEVOPHED) Adult infusion 2 mcg/min (06/09/20 0800)  . propofol (DIPRIVAN) infusion 15 mcg/kg/min (06/09/20 0800)   PRN Meds: sodium chloride, docusate sodium, fentaNYL, midazolam, polyethylene glycol, sodium chloride flush   Vital Signs    Vitals:   06/09/20 0745 06/09/20 0800 06/09/20 0805 06/09/20 0815  BP: 103/84 99/85  103/87  Pulse: 76 74 76 76  Resp: (!) _0 Temp:  98.2 F (36.8 C)    TempSrc:  Bladder    SpO2: 100% 100% 100% 100%  Weight:      Height:        Intake/Output Summary (Last 24 hours) at 06/09/2020 0818 Last data filed at 06/09/2020 0800 Gross per 24 hour  Intake 1618.9 ml  Output 545 ml  Net 1073.9 ml   Filed Weights   06/14/2020 1900 06/05/2020 2000 06/09/20 0205  Weight: 100 kg 100.5 kg 100.5 kg    Telemetry    No further VT/VF - Personally Reviewed  ECG    No new - Personally Reviewed  Physical Exam    General: Intubated and sedated.  Psych: Unable to be assessed.  Neuro: Sedated.  Fasciculations in the face/mouth/neck. HEENT: Normal           Neck: Supple without bruits or JVD. Lungs:  Resp regular and unlabored. Intubated.  Heart: RRR no s3, s4, or murmurs. Abdomen: Soft, non-tender, non-distended, BS + x 4.  Extremities: No clubbing, cyanosis or edema. DP/PT/Radials 2+ and equal bilaterally.  Labs    Chemistry Recent Labs  Lab 06/21/2020 1553 06/09/2020 1808 07/01/2020 1954 06/09/20 0400 06/09/20 0721  NA 143   < > 139 140 141  K 3.5   < > 3.1* 4.8 4.9  CL 100  --  100 105  --   CO2 15*  --  19* 20*  --   GLUCOSE 236*  --  187* 126*  --   BUN 7  --  10 11  --   CREATININE 1.63*  --  1.42* 1.57*  --   CALCIUM 9.2  --  8.7* 9.1  --   PROT 8.2*  --   --   --   --   ALBUMIN 4.3  --   --   --   --   AST 471*  --   --   --   --   ALT 286*  --   --   --   --  ALKPHOS 123  --   --   --   --   BILITOT 1.3*  --   --   --   --   GFRNONAA 53*  --  >60 55*  --   ANIONGAP 28*  --  20* 15  --    < > = values in this interval not displayed.     Hematology Recent Labs  Lab 06/23/2020 1553 06/23/2020 1808 06/05/2020 1954 06/09/20 0400 06/09/20 0721  WBC 16.0*  --  22.6* 20.3*  --   RBC 5.04  --  4.89 4.97  --   HGB 16.9   < > 16.5 17.2* 18.0*  HCT 54.2*   < > 49.1 50.3 53.0*  MCV 107.5*  --  100.4* 101.2*  --   MCH 33.5  --  33.7 34.6*  --   MCHC 31.2  --  33.6 34.2  --   RDW 15.1  --  15.1 15.6*  --   PLT 270  --  284 281  --    < > = values in this interval not displayed.    Cardiac EnzymesNo results for input(s): TROPONINI in the last 168 hours. No results for input(s): TROPIPOC in the last 168 hours.   BNPNo results for input(s): BNP, PROBNP in the last 168 hours.   DDimer No results for input(s): DDIMER in the last 168 hours.   Radiology    CT Head Wo Contrast  Result Date: 06/10/2020 CLINICAL DATA:  Headache.  Intracranial hemorrhage suspected. EXAM: CT HEAD WITHOUT CONTRAST TECHNIQUE: Contiguous axial images were obtained from the base of the  skull through the vertex without intravenous contrast. COMPARISON:  None. FINDINGS: Brain: No evidence of acute infarction, hemorrhage, hydrocephalus, extra-axial collection or mass lesion/mass effect. Vascular: Hyperdense vessels likely due to administration of contrast for concurrent chest CT angiogram. Skull: Normal. Negative for fracture or focal lesion. Sinuses/Orbits: Partial opacification of the ethmoid sinuses. Other: None. IMPRESSION: 1. No acute intracranial abnormality. 2. Hyperdense intracranial vessels likely due to administration of contrast for concurrent chest CT angiogram. Electronically Signed   By: Fidela Salisbury M.D.   On: 06/07/2020 16:34   DG Chest Port 1 View  Result Date: 06/09/2020 CLINICAL DATA:  45 year old male loss of consciousness, intubated. EXAM: PORTABLE CHEST 1 VIEW COMPARISON:  CT Chest, Abdomen, and Pelvis today are reported separately. 06/07/2020 and earlier. FINDINGS: Portable AP semi upright view at 0441 hours. More kyphotic positioning. ETT tip appear stable the level the clavicles. Enteric tube courses to the abdomen as before, tip not included. New left IJ approach central line, tip projects about 3 vertebral bodies below the carina near the level of the diaphragm. New resuscitation or pacer pads project over the lower chest. Mediastinal contours appear stable. No pneumothorax. Lower lung volumes with crowding of lung markings. No pleural effusion or consolidation is evident. No acute osseous abnormality identified. Paucity bowel gas in the upper abdomen. IMPRESSION: 1. Left IJ central line placed. Tip is at the right atrium level given the current lung volumes. Recommend retraction of 1-2 cm and attention on follow-up. 2.  Otherwise stable lines and tubes. 3. Lower lung volumes with perihilar and basilar atelectasis suspected. No pneumothorax. Electronically Signed   By: Genevie Ann M.D.   On: 06/09/2020 06:41   DG Chest Portable 1 View  Result Date:  06/17/2020 CLINICAL DATA:  Post intubation. EXAM: PORTABLE CHEST 1 VIEW COMPARISON:  March 03, 2020 FINDINGS: There has been an interval intubation. The endotracheal  tube tip in satisfactory position. Cardiomediastinal silhouette is normal. Mediastinal contours appear intact. There is no evidence of focal airspace consolidation, pleural effusion or pneumothorax. Osseous structures are without acute abnormality. Soft tissues are grossly normal. IMPRESSION: 1. Interval intubation. 2. No evidence of acute cardiopulmonary disease. Electronically Signed   By: Fidela Salisbury M.D.   On: 06/04/2020 15:54   CT Angio Chest/Abd/Pel for Dissection W and/or W/WO  Result Date: 06/29/2020 CLINICAL DATA:  Chest pain, loss of consciousness EXAM: CT ANGIOGRAPHY CHEST, ABDOMEN AND PELVIS TECHNIQUE: Non-contrast CT of the chest was initially obtained. Multidetector CT imaging through the chest, abdomen and pelvis was performed using the standard protocol during bolus administration of intravenous contrast. Multiplanar reconstructed images and MIPs were obtained and reviewed to evaluate the vascular anatomy. CONTRAST:  172m OMNIPAQUE IOHEXOL 350 MG/ML SOLN COMPARISON:  03/03/2020 FINDINGS: CTA CHEST FINDINGS Cardiovascular: No evidence of thoracic aortic aneurysm or dissection. Common origin of the innominate artery and left common carotid artery from the aortic arch. Mild cardiomegaly unchanged. No pericardial effusion. Insufficient pulmonary arterial contrast enhancement to evaluate for pulmonary emboli. Mediastinum/Nodes: Endotracheal tube well above carina. Enteric catheter extends into the gastric lumen. No pathologic adenopathy. Lungs/Pleura: Dependent lower lobe atelectasis. No acute airspace disease, effusion, or pneumothorax. Central airways are patent. Musculoskeletal: No acute or destructive bony lesions. Reconstructed images demonstrate no additional findings. Review of the MIP images confirms the above findings.  CTA ABDOMEN AND PELVIS FINDINGS VASCULAR Aorta: Normal caliber aorta without aneurysm, dissection, vasculitis or significant stenosis. Celiac: Patent without evidence of aneurysm, dissection, vasculitis or significant stenosis. SMA: Patent without evidence of aneurysm, dissection, vasculitis or significant stenosis. Renals: Both renal arteries are patent without evidence of aneurysm, dissection, vasculitis, fibromuscular dysplasia or significant stenosis. There is an accessory left renal artery supplying the lower pole, with mild narrowing at its origin unchanged since previous. IMA: Patent without evidence of aneurysm, dissection, vasculitis or significant stenosis. Inflow: Patent without evidence of aneurysm, dissection, vasculitis or significant stenosis. Minimal atherosclerosis. Veins: No obvious venous abnormality within the limitations of this arterial phase study. Review of the MIP images confirms the above findings. NON-VASCULAR Hepatobiliary: Diffuse hepatic steatosis unchanged. No focal liver abnormality. Gallbladder is unremarkable. Pancreas: Unremarkable. No pancreatic ductal dilatation or surrounding inflammatory changes. Spleen: Normal in size without focal abnormality. Adrenals/Urinary Tract: Adrenal glands are unremarkable. Kidneys are normal, without renal calculi, focal lesion, or hydronephrosis. Bladder is unremarkable. Stomach/Bowel: No bowel obstruction or ileus. No bowel wall thickening or inflammatory change. The appendix is surgically absent. Lymphatic: No pathologic adenopathy. Reproductive: Prostate is unremarkable. Other: No free fluid or free gas. No abdominal wall hernia. Musculoskeletal: No acute or destructive bony lesions. Reconstructed images demonstrate no additional findings. Review of the MIP images confirms the above findings. IMPRESSION: 1. No evidence of thoracic or abdominal aortic aneurysm or dissection. 2. Insufficient pulmonary arterial contrast enhancement to evaluate for  pulmonary emboli. 3. Diffuse hepatic steatosis. Electronically Signed   By: MRanda NgoM.D.   On: 06/27/2020 16:36    Cardiac Studies   No new   Assessment & Plan    #NICM #VT/VF arrest   Continue supportive care. No indication for emergent eval of coronaries given relatively recent LHC. Consider neuro eval for fasciculations +/- EEG.  For questions or updates, please contact CSimsPlease consult [email or web address] for contact info under Cardiology/STEMI.      Signed, CLars Mage MD  06/09/2020, 8:18 AM

## 2020-06-09 NOTE — Procedures (Addendum)
History: 45 year old male status post cardiac arrest with some concern for "myoclonus"  Sedation: None  Technique: This is a 21 channel routine scalp EEG performed at the bedside with bipolar and monopolar montages arranged in accordance to the international 10/20 system of electrode placement. One channel was dedicated to EKG recording.    Background: There is nearly continuous 1 to 2 Hz generalized, frontocentral predominant discharges with triphasic morphology though there is not definite evolution and frequency, there is spread in field and evolution and morphology.  These periods of spread/evolution are interspersed with slow periodic discharges with a 1 Hz frequency.  There is at least one episode where the frequency approaches 3 Hz concerning for seizure.  Photic stimulation: Physiologic driving is now performed  EEG Abnormalities: 1) generalized periodic discharges with triphasic morphology 2) discrete episodes of spread in field/evolution and morphology concerning for ongoing seizure activity  Clinical Interpretation: This EEG recorded a pattern on the ictal-interictal continuum, though there are features that I find concerning for an ictal nature to these discharges(e.g. status epilepticus).  Clinical correlation is advised, would consider benzodiazepine challenge.  Unfortunately due to technical reasons, I was unable to review the video associated with this EEG.  Ritta Slot, MD [facility] [phone]  If 7pm- 7am, please page neurology on call as listed in AMION.

## 2020-06-09 NOTE — Plan of Care (Signed)
  Problem: Education: °Goal: Knowledge of General Education information will improve °Description: Including pain rating scale, medication(s)/side effects and non-pharmacologic comfort measures °Outcome: Not Met (add Reason) °  °Problem: Health Behavior/Discharge Planning: °Goal: Ability to manage health-related needs will improve °Outcome: Not Met (add Reason) °  °Problem: Clinical Measurements: °Goal: Ability to maintain clinical measurements within normal limits will improve °Outcome: Not Met (add Reason) °Goal: Will remain free from infection °Outcome: Not Met (add Reason) °Goal: Diagnostic test results will improve °Outcome: Not Met (add Reason) °Goal: Respiratory complications will improve °Outcome: Not Met (add Reason) °Goal: Cardiovascular complication will be avoided °Outcome: Not Met (add Reason) °  °Problem: Activity: °Goal: Risk for activity intolerance will decrease °Outcome: Not Met (add Reason) °  °Problem: Nutrition: °Goal: Adequate nutrition will be maintained °Outcome: Not Met (add Reason) °  °Problem: Coping: °Goal: Level of anxiety will decrease °Outcome: Not Met (add Reason) °  °Problem: Elimination: °Goal: Will not experience complications related to bowel motility °Outcome: Not Met (add Reason) °Goal: Will not experience complications related to urinary retention °Outcome: Not Met (add Reason) °  °Problem: Pain Managment: °Goal: General experience of comfort will improve °Outcome: Not Met (add Reason) °  °Problem: Safety: °Goal: Ability to remain free from injury will improve °Outcome: Not Met (add Reason) °  °Problem: Skin Integrity: °Goal: Risk for impaired skin integrity will decrease °Outcome: Not Met (add Reason) °  °

## 2020-06-09 NOTE — Progress Notes (Signed)
Keppra IVPB started at this time

## 2020-06-09 NOTE — Progress Notes (Signed)
Provider notified of increasing myoclonic activity and vent asynchrony. Neuro status remains unchanged from previous assessments. See MAR for new orders.

## 2020-06-09 NOTE — Progress Notes (Signed)
ANTICOAGULATION CONSULT NOTE  Pharmacy Consult for heparin Indication: chest pain/ACS  Allergies  Allergen Reactions  . Penicillins Swelling    Did it involve swelling of the face/tongue/throat, SOB, or low BP? Y Did it involve sudden or severe rash/hives, skin peeling, or any reaction on the inside of your mouth or nose? N Did you need to seek medical attention at a hospital or doctor's office? Y When did it last happen? 10 years If all above answers are "NO", may proceed with cephalosporin use.    Patient Measurements: Height: 6\' 2"  (188 cm) Weight: 100.5 kg (221 lb 9 oz) IBW/kg (Calculated) : 82.2 Heparin Dosing Weight: 100kg  Vital Signs: Temp: 98.6 F (37 C) (02/06 1200) Temp Source: Bladder (02/06 1200) BP: 110/87 (02/06 1215) Pulse Rate: 77 (02/06 1215)  Labs: Recent Labs    06-11-20 1553 2020-06-11 1808 June 11, 2020 1826 06-11-20 1954 06/09/20 0400 06/09/20 0721 06/09/20 1108  HGB 16.9   < >  --  16.5 17.2* 18.0*  --   HCT 54.2*   < >  --  49.1 50.3 53.0*  --   PLT 270  --   --  284 281  --   --   LABPROT 14.0  --   --   --   --   --   --   INR 1.1  --   --   --   --   --   --   HEPARINUNFRC  --   --   --   --  0.57  --  0.68  CREATININE 1.63*  --   --  1.42* 1.57*  --   --   TROPONINIHS 241*  --  9,095* 13,260* 18,068*  --   --    < > = values in this interval not displayed.    Estimated Creatinine Clearance: 76 mL/min (A) (by C-G formula based on SCr of 1.57 mg/dL (H)).  Assessment: 45 year old male admitted post cardiac arrest with approximately 30 minutes of downtime. Patient now with elevated hs troponin to >9k. On heparin for ACS.  Heparin level therapeutic (0.68) on gtt at 1400 units/hr.  Goal of Therapy:  Heparin level 0.3-0.7 units/ml Monitor platelets by anticoagulation protocol: Yes   Plan:  Continue heparin at 1400 units/hr Daily heparin level and CBC  59, PharmD Clinical Pharmacist **Pharmacist phone directory can now be found  on [email or web address] ([email or web address]).  Listed under [facility].

## 2020-06-09 NOTE — Progress Notes (Signed)
LTM EEG hooked up and running - no initial skin breakdown - push button tested - neuro notified. Event button tested , [facility] monitored.

## 2020-06-09 NOTE — Progress Notes (Signed)
  Echocardiogram 2D Echocardiogram has been performed with  Definity.  Gerda Diss 06/09/2020, 10:16 AM

## 2020-06-09 NOTE — Progress Notes (Signed)
NAME:  Derrick Hoffman, MRN:  [id number], DOB:  December 19, 1975, LOS: 1 ADMISSION DATE:  06/25/2020, CONSULTATION DATE:  2020-06-25 REFERRING MD:  Dr.Rees, CHIEF COMPLAINT:  Cardiac arrest   Brief History:  45yo male admitted post cardiac arrest, estimated downtime ~55mins  History of Present Illness:  Derrick Hoffman is a 44 y.o. male with PMH significant for HTN, tobacco abuse, hepatic steatosis, and anxiety who presented to the ED via EMS after suffering outside hospital arrest. Patient was found in hotel room by friend complaining of chest pain. On EMS arrival patient was seen in PEA. He received 4 rounds of Epi and one shock for Vfib before ROSC seen. King airway placed prior to ED arrival. Estimated downtime was approximately   Vitals on admission significant for hypothermia, tachypnea, tachycardia and hypertension. Labwork significant for low K, mild transaminitis. ABG hypoxic and hypercapnic respiratory failure  CXR negative. CT head negative. CT angio chest/ang/pelvis negative for acute dissection insufficient contrast to evaluate for PE. PCCM consulted for further management and admisison  Girlfriend who accompanies said he drank a fair bit last night but seemed fine until this afternoon when he began having crushing left chest pain, fell down and starting having frothing at mouth as well as seizure like activity.  CPR was started fairly quickly.  Does not do cocaine, does smoke marijuana fairly regularly.  Past Medical History:  Smoker HTN Steatohepatitis Anxiety Alcohol use  Significant Hospital Events:  June 25, 2020 admitted  Consults:  Cardiology  Procedures:    Significant Diagnostic Tests:  CXR 2/5 > negative  CT head 2/5 > negative  CT angio chest/ang/pelvis 2/5 > negative for acute dissection insufficient contrast to evaluate for PE  Micro Data:  COVID 2/5 >  Antimicrobials:     Interim History / Subjective:  Unfortunately now having myoclonus.  Objective   Blood  pressure 104/85, pulse 79, temperature 98.2 F (36.8 C), temperature source Bladder, resp. rate (!) 21, height 6\' 2"  (1.88 m), weight 100.5 kg, SpO2 100 %.    Vent Mode: PRVC FiO2 (%):  [50 %-100 %] 50 % Set Rate:  [16 bmp] 16 bmp Vt Set:  [650 mL] 650 mL PEEP:  [5 cmH20] 5 cmH20 Plateau Pressure:  [16 cmH20-21 cmH20] 21 cmH20   Intake/Output Summary (Last 24 hours) at 06/09/2020 0856 Last data filed at 06/09/2020 0800 Gross per 24 hour  Intake 1618.9 ml  Output 545 ml  Net 1073.9 ml   Filed Weights   06-25-20 1900 06/25/2020 2000 06/09/20 0205  Weight: 100 kg 100.5 kg 100.5 kg    Examination: Constitutional: ill appearing man in myoclonus  Eyes: upward gaze preference, eyelid twitching Ears, nose, mouth, and throat: ETT in place, minimal secretions Cardiovascular: RRR, ext warm Respiratory: Scattered mechanical breath sounds, triggering vent Gastrointestinal: Soft, hypoactive BS Skin: No rashes, normal turgor Neurologic: GCS3, myoclonus, on sedation Psychiatric: cannot assess   ABG okay CBG okay Cr stable K/mg look good Trops still going up WBC going down  Resolved Hospital Problem list     Assessment & Plan:  OOH PEA arrest, NSTEMI- prolonged downtime.   Also has had intermittent VT since ablated with electrolyte repletion, amiodarone pushes and gtt as well as lidocaine push.  Events preceded by hours of left chest pain.  Girlfriend denies any cocaine use.  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  EKGs without STEMI but do have some ischemic changes. Not cath candidate unless some neuro recovery. Anoxic encephalopathy, myoclonus-  delayed which portends worse prognosis. Baseline HTN, EtOH cardiomyopathy, MJ abuse, HLD  - TTM, vEEG - f/u echo, bedside did not look too different from one in Nov 2021 - Amio, heparin gtt, asa - Sedate to vent synchrony, benefit of suppressing delayed myoclonus has not been established in literature -  Consider repeat head CT vs. 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

## 2020-06-09 NOTE — Progress Notes (Signed)
ANTICOAGULATION CONSULT NOTE  Pharmacy Consult for heparin Indication: chest pain/ACS  Allergies  Allergen Reactions  . Penicillins Swelling    Did it involve swelling of the face/tongue/throat, SOB, or low BP? Y Did it involve sudden or severe rash/hives, skin peeling, or any reaction on the inside of your mouth or nose? N Did you need to seek medical attention at a hospital or doctor's office? Y When did it last happen? 10 years If all above answers are "NO", may proceed with cephalosporin use.    Patient Measurements: Height: 6\' 2"  (188 cm) Weight: 100.5 kg (221 lb 9 oz) IBW/kg (Calculated) : 82.2 Heparin Dosing Weight: 100kg  Vital Signs: Temp: 98.1 F (36.7 C) (02/06 0400) Temp Source: Bladder (02/06 0400) BP: 113/82 (02/06 0434) Pulse Rate: 87 (02/06 0434)  Labs: Recent Labs    06/18/2020 1546 06/22/2020 1553 06/10/2020 1808 06/06/2020 1826 07/01/2020 1954 06/09/20 0400  HGB 18.0* 16.9 17.0  --  16.5 17.2*  HCT 53.0* 54.2* 50.0  --  49.1 50.3  PLT  --  270  --   --  284 281  LABPROT  --  14.0  --   --   --   --   INR  --  1.1  --   --   --   --   HEPARINUNFRC  --   --   --   --   --  0.57  CREATININE 1.30* 1.63*  --   --  1.42*  --   TROPONINIHS  --  241*  --  9,095* 13,260*  --     Estimated Creatinine Clearance: 84 mL/min (A) (by C-G formula based on SCr of 1.42 mg/dL (H)).  Assessment: 45 year old male admitted post cardiac arrest with approximately 30 minutes of downtime. Patient now with elevated hs troponin to >9k. On heparin for ACS.  Heparin level therapeutic (0.57) on gtt at 1400 units/hr. No bleeding noted.  Goal of Therapy:  Heparin level 0.3-0.7 units/ml Monitor platelets by anticoagulation protocol: Yes   Plan:  Continue heparin at 1400 units/hr F/u 6h confirmatory heparin level  59, PharmD, BCPS Please see amion for complete clinical pharmacist phone list 06/09/2020 4:47 AM

## 2020-06-09 NOTE — Progress Notes (Signed)
Brief Neuro Update. Notified by LTM reader that patient has GPEDs, now at 2-2.5Hz  on cEEG.  Recs: - I ordered Ativan 2mg  IV once - I ordered VPA load of 1500mg  IV once - I increased Keppra to 1500mg  BID. - VPA post load levels have been ordered.  Addendum @ 0334: - Will start on VPA 500 IV TID with a dose now and increase Propofol to 2mcg/kg/min.   [facility] Pager Number 

## 2020-06-09 NOTE — Progress Notes (Signed)
EEG complete - results pending 

## 2020-06-10 DIAGNOSIS — Z7189 Other specified counseling: Secondary | ICD-10-CM

## 2020-06-10 DIAGNOSIS — Z515 Encounter for palliative care: Secondary | ICD-10-CM

## 2020-06-10 DIAGNOSIS — J9601 Acute respiratory failure with hypoxia: Secondary | ICD-10-CM

## 2020-06-10 DIAGNOSIS — Z9911 Dependence on respirator [ventilator] status: Secondary | ICD-10-CM

## 2020-06-10 DIAGNOSIS — G931 Anoxic brain damage, not elsewhere classified: Secondary | ICD-10-CM

## 2020-06-10 LAB — HEPATIC FUNCTION PANEL
ALT: 122 U/L — ABNORMAL HIGH (ref 0–44)
AST: 199 U/L — ABNORMAL HIGH (ref 15–41)
Albumin: 3.7 g/dL (ref 3.5–5.0)
Alkaline Phosphatase: 107 U/L (ref 38–126)
Bilirubin, Direct: 0.5 mg/dL — ABNORMAL HIGH (ref 0.0–0.2)
Indirect Bilirubin: 0.8 mg/dL (ref 0.3–0.9)
Total Bilirubin: 1.3 mg/dL — ABNORMAL HIGH (ref 0.3–1.2)
Total Protein: 7.2 g/dL (ref 6.5–8.1)

## 2020-06-10 LAB — CBC
HCT: 49.1 % (ref 39.0–52.0)
Hemoglobin: 15.6 g/dL (ref 13.0–17.0)
MCH: 33.3 pg (ref 26.0–34.0)
MCHC: 31.8 g/dL (ref 30.0–36.0)
MCV: 104.7 fL — ABNORMAL HIGH (ref 80.0–100.0)
Platelets: 209 10*3/uL (ref 150–400)
RBC: 4.69 MIL/uL (ref 4.22–5.81)
RDW: 16 % — ABNORMAL HIGH (ref 11.5–15.5)
WBC: 17.7 10*3/uL — ABNORMAL HIGH (ref 4.0–10.5)
nRBC: 0 % (ref 0.0–0.2)

## 2020-06-10 LAB — POCT I-STAT 7, (LYTES, BLD GAS, ICA,H+H)
Acid-base deficit: 2 mmol/L (ref 0.0–2.0)
Bicarbonate: 24.5 mmol/L (ref 20.0–28.0)
Calcium, Ion: 1.21 mmol/L (ref 1.15–1.40)
HCT: 49 % (ref 39.0–52.0)
Hemoglobin: 16.7 g/dL (ref 13.0–17.0)
O2 Saturation: 95 %
Potassium: 4.4 mmol/L (ref 3.5–5.1)
Sodium: 140 mmol/L (ref 135–145)
TCO2: 26 mmol/L (ref 22–32)
pCO2 arterial: 47.7 mmHg (ref 32.0–48.0)
pH, Arterial: 7.318 — ABNORMAL LOW (ref 7.350–7.450)
pO2, Arterial: 84 mmHg (ref 83.0–108.0)

## 2020-06-10 LAB — GLUCOSE, CAPILLARY
Glucose-Capillary: 83 mg/dL (ref 70–99)
Glucose-Capillary: 91 mg/dL (ref 70–99)
Glucose-Capillary: 107 mg/dL — ABNORMAL HIGH (ref 70–99)
Glucose-Capillary: 84 mg/dL (ref 70–99)
Glucose-Capillary: 134 mg/dL — ABNORMAL HIGH (ref 70–99)

## 2020-06-10 LAB — BASIC METABOLIC PANEL
Anion gap: 16 — ABNORMAL HIGH (ref 5–15)
Anion gap: 12 (ref 5–15)
BUN: 21 mg/dL — ABNORMAL HIGH (ref 6–20)
BUN: 22 mg/dL — ABNORMAL HIGH (ref 6–20)
CO2: 18 mmol/L — ABNORMAL LOW (ref 22–32)
CO2: 22 mmol/L (ref 22–32)
Calcium: 8.9 mg/dL (ref 8.9–10.3)
Calcium: 8.8 mg/dL — ABNORMAL LOW (ref 8.9–10.3)
Chloride: 106 mmol/L (ref 98–111)
Chloride: 106 mmol/L (ref 98–111)
Creatinine, Ser: 1.69 mg/dL — ABNORMAL HIGH (ref 0.61–1.24)
Creatinine, Ser: 1.29 mg/dL — ABNORMAL HIGH (ref 0.61–1.24)
GFR, Estimated: 51 mL/min — ABNORMAL LOW (ref >60)
GFR, Estimated: >60 mL/min (ref >60)
Glucose, Bld: 106 mg/dL — ABNORMAL HIGH (ref 70–99)
Glucose, Bld: 110 mg/dL — ABNORMAL HIGH (ref 70–99)
Potassium: 3.8 mmol/L (ref 3.5–5.1)
Potassium: 3.6 mmol/L (ref 3.5–5.1)
Sodium: 140 mmol/L (ref 135–145)
Sodium: 140 mmol/L (ref 135–145)

## 2020-06-10 LAB — CK TOTAL AND CKMB (NOT AT ARMC)
CK, MB: 22.5 ng/mL — ABNORMAL HIGH (ref 0.5–5.0)
Relative Index: 1.1 (ref 0.0–2.5)
Total CK: 2056 U/L — ABNORMAL HIGH (ref 49–397)

## 2020-06-10 LAB — VALPROIC ACID LEVEL: Valproic Acid Lvl: 63 ug/mL (ref 50.0–100.0)

## 2020-06-10 LAB — HEPARIN LEVEL (UNFRACTIONATED): Heparin Unfractionated: 0.39 IU/mL (ref 0.30–0.70)

## 2020-06-10 LAB — MAGNESIUM: Magnesium: 2.5 mg/dL — ABNORMAL HIGH (ref 1.7–2.4)

## 2020-06-10 LAB — LACTIC ACID, PLASMA: Lactic Acid, Venous: 2.1 mmol/L (ref 0.5–1.9)

## 2020-06-10 LAB — PHOSPHORUS: Phosphorus: 3.5 mg/dL (ref 2.5–4.6)

## 2020-06-10 MED ORDER — PROSOURCE TF PO LIQD
45.0000 mL | Freq: Four times a day (QID) | ORAL | Status: DC
  Administered 2020-06-10 – 2020-06-12 (×7): 45 mL
  Filled 2020-06-10 (×7): qty 45

## 2020-06-10 MED ORDER — VALPROATE SODIUM 100 MG/ML IV SOLN
500.0000 mg | Freq: Three times a day (TID) | INTRAVENOUS | Status: DC
  Administered 2020-06-10 – 2020-06-12 (×8): 500 mg via INTRAVENOUS
  Filled 2020-06-10 (×10): qty 5

## 2020-06-10 MED ORDER — MIDAZOLAM BOLUS VIA INFUSION
10.0000 mg | Freq: Once | INTRAVENOUS | Status: DC
  Filled 2020-06-10: qty 10

## 2020-06-10 MED ORDER — VITAL AF 1.2 CAL PO LIQD
1000.0000 mL | ORAL | Status: DC
  Administered 2020-06-10 – 2020-06-11 (×2): 1000 mL

## 2020-06-10 MED ORDER — LACTATED RINGERS IV SOLN: INTRAVENOUS | Status: DC

## 2020-06-10 MED ORDER — MIDAZOLAM 50MG/50ML (1MG/ML) PREMIX INFUSION
0.5000 mg/h | INTRAVENOUS | Status: DC
  Administered 2020-06-10: 5 mg/h via INTRAVENOUS
  Administered 2020-06-10: 0.5 mg/h via INTRAVENOUS
  Administered 2020-06-10 – 2020-06-11 (×2): 5 mg/h via INTRAVENOUS
  Filled 2020-06-10 (×4): qty 50

## 2020-06-10 NOTE — Progress Notes (Signed)
Initial Nutrition Assessment  DOCUMENTATION CODES:   Not applicable  INTERVENTION:   Tube feeding:  -Vital AF 1.2 @ 55 ml/hr via OG (1320 ml) -ProSource TF 45 ml QID  Provides: 1744 kcals (2219 kcal with propofol), 143 grams protein, 1071 ml free water.   NUTRITION DIAGNOSIS:   Increased nutrient needs related to acute illness as evidenced by estimated needs.  GOAL:   Patient will meet greater than or equal to 90% of their needs  MONITOR:   Vent status,Skin,TF tolerance,Weight trends,Labs,I & O's  REASON FOR ASSESSMENT:   Consult Enteral/tube feeding initiation and management  ASSESSMENT:   Patient with PMH significant for HTN, ETOH use, and steatohepatitis. Presents this admission with PEA arrest/NSTEMI in setting of ETOH use.   Pt discussed during ICU rounds and with RN.   On propofol. Unable to increase due to elevated triglycerides. Concern for anoxic injury given prolonged downtime. Plan for brain MRI tomorrow. Vital High Protein started at 40 ml/hr, transition to different formula to better meet needs.   Girlfriend at bedside denies patient has decreased appetite PTA. Typically eats two large meals daily. Denies recent weight loss. Utilize 100.5 kg as EDW for now.   Patient is currently intubated on ventilator support MV: 8 L/min Temp (24hrs), Avg:98.7 F (37.1 C), Min:97.5 F (36.4 C), Max:99.9 F (37.7 C)  UOP: 660 ml x 24 hrs  Drips: LR @ 75 ml/hr, propofol (18 ml/hr) Medications: SS novolog Labs: Mg 2.5 (H) CBG 83-112 TGs 324 (H)  NUTRITION - FOCUSED PHYSICAL EXAM:  Flowsheet Row Most Recent Value  Orbital Region No depletion  Upper Arm Region No depletion  Thoracic and Lumbar Region Unable to assess  Buccal Region Unable to assess  Temple Region No depletion  Clavicle Bone Region No depletion  Clavicle and Acromion Bone Region No depletion  Scapular Bone Region Unable to assess  Dorsal Hand No depletion  Patellar Region No depletion   Anterior Thigh Region No depletion  Posterior Calf Region No depletion  Edema (RD Assessment) Mild  Hair Reviewed  Eyes Unable to assess  Mouth Unable to assess  Skin Reviewed  Nails Reviewed     Diet Order:   Diet Order    None      EDUCATION NEEDS:   Not appropriate for education at this time  Skin:  Skin Assessment: Skin Integrity Issues: Skin Integrity Issues:: Incisions Incisions: R lip  Last BM:  PTA  Height:   Ht Readings from Last 1 Encounters:  06/20/2020 6\' 2"  (1.88 m)    Weight:   Wt Readings from Last 1 Encounters:  06/10/20 102.5 kg    BMI:  Body mass index is 29.01 kg/m.  Estimated Nutritional Needs:   Kcal:  2140 kcal  Protein:  130-145 grams  Fluid:  >/= 2 L/day  08/08/20 RD, LDN Clinical Nutrition Pager listed in AMION

## 2020-06-10 NOTE — Consult Note (Signed)
Consultation Note Date: 06/10/2020   Patient Name: Derrick Hoffman  DOB: [birth year]  MRN: [id number]  Age / Sex: 45 y.o., male  PCP: Ladell Pier, MD Referring Physician: Julian Hy, DO  Reason for Consultation: Establishing goals of care  HPI/Patient Profile: 45 y.o. 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 Noted to have chest pain and seizure activity prior to arrest. Also with pulseless VT with one shock and CPR x 2 minutes in ED. Concern for myoclonus, seizures, lack of brainstem reflexes, anoxic brain injury. Now with EF 15-20%, global hypokinesis, grade 3 diastolic dysfunction and mild RV failure.   Clinical Assessment and Goals of Care: I met today at Ms. Hail' bedside with girlfriend, Derrick Hoffman. Derrick Hoffman shares that she has been with Mr. Keetch ~8-9 months and she was present at time of his cardiac arrest. She has been communicating with his family (mainly his sister, Derrick Hoffman). She has also had conversation with providers her at the hospital. She clarifies that Ms. Mctigue has a father and 2 sisters. She also notes that he has 3 sons but she is unsure of their location or ages. At this time she is still trying to process Ms. Chiang current state and what has happened. Emotional support provided.   I met again today at Mr. Cragle bedside with his father and 2 sisters. They have had conversation with PCCM today and have good understanding of Mr. Senft condition. I clarified surrogate decision maker as Mr. Bellanca father and they are trying to pull Mr. Knobel adult sons into conversation as well. Father and sisters work together. They agree with providing information to girlfriend but she is not the  legal decision maker.   Family have plans for meeting with myself and PCCM tomorrow 1300 pm. We will try and get sons over telephone to participate. Gave instructions on how to access [facility] for televisit for sons. Father does share that he knows that this will be "in God's hands."  All questions/concerns addressed. Emotional support provided.   Primary Decision Maker NEXT OF KIN adult sons (as available) and father with support of sisters    SUMMARY OF RECOMMENDATIONS   - Family meeting tomorrow 2/8 1300  Code Status/Advance Care Planning:  Full code   Symptom Management:   Per PCCM.   Palliative Prophylaxis:   Frequent Pain Assessment, Oral Care and Turn Reposition  Psycho-social/Spiritual:   Desire for further Chaplaincy support:yes  Additional Recommendations: Compassionate Wean Education and Grief/Bereavement Support  Prognosis:   Overall prognosis very concerning from neurological standpoint.   Discharge Planning: To Be Determined      Primary Diagnoses: Present on Admission: **None**   I have reviewed the medical record, interviewed the patient and family, and examined the patient. The following aspects are pertinent.  Past Medical History:  Diagnosis Date  . Abnormal  liver function test   . Anxiety   . Aortic atherosclerosis (HCC)   . Childhood asthma   . Habitual alcohol use   . Hepatic steatosis   . History of kidney stones 1990  . Hypertension   . Hypokalemia   . Tobacco abuse    Social History   Socioeconomic History  . Marital status: Married    Spouse name: Not on file  . Number of children: 0  . Years of education: Not on file  . Highest education level: Bachelor's degree (e.g., BA, AB, BS)  Occupational History  . Occupation: unemployed  Tobacco Use  . Smoking status: Current Every Day Smoker    Packs/day: 0.50    Years: 1.00    Pack years: 0.50  . Smokeless tobacco: Never Used  Vaping Use  . Vaping Use: Never used   Substance and Sexual Activity  . Alcohol use: Yes    Alcohol/week: 48.0 standard drinks    Types: 48 Shots of liquor per week    Comment: 01/05/2018 " 5th of liquor/day;  3 days/wk  . Drug use: Not Currently  . Sexual activity: Not Currently  Other Topics Concern  . Not on file  Social History Narrative  . Not on file   Social Determinants of Health   Financial Resource Strain: Not on file  Food Insecurity: Not on file  Transportation Needs: Not on file  Physical Activity: Not on file  Stress: Not on file  Social Connections: Not on file   Family History  Problem Relation Age of Onset  . CAD Mother        MI age 67  . Diabetes Father    Scheduled Meds: . acetaminophen  650 mg Oral Q4H   Or  . acetaminophen (TYLENOL) oral liquid 160 mg/5 mL  650 mg Per Tube Q4H   Or  . acetaminophen  650 mg Rectal Q4H  . chlorhexidine gluconate (MEDLINE KIT)  15 mL Mouth Rinse BID  . Chlorhexidine Gluconate Cloth  6 each Topical Daily  . feeding supplement (PROSource TF)  45 mL Per Tube BID  . feeding supplement (VITAL HIGH PROTEIN)  1,000 mL Per Tube Q24H  . insulin aspart  0-20 Units Subcutaneous Q4H  . mouth rinse  15 mL Mouth Rinse 10 times per day  . midazolam  10 mg Intravenous Once  . pantoprazole (PROTONIX) IV  40 mg Intravenous QHS  . sodium chloride flush  10-40 mL Intracatheter Q12H   Continuous Infusions: . sodium chloride 10 mL/hr at 06/10/20 1100  . amiodarone 30 mg/hr (06/10/20 1100)  . fentaNYL infusion INTRAVENOUS 200 mcg/hr (06/10/20 1100)  . heparin 1,400 Units/hr (06/10/20 1100)  . lactated ringers 75 mL/hr at 06/10/20 1100  . levETIRAcetam Stopped (06/10/20 0547)  . midazolam 0.5 mg/hr (06/10/20 1122)  . norepinephrine (LEVOPHED) Adult infusion Stopped (06/09/20 1106)  . propofol (DIPRIVAN) infusion 30 mcg/kg/min (06/10/20 1100)  . valproate sodium Stopped (06/10/20 0509)   PRN Meds:.sodium chloride, docusate sodium, fentaNYL, midazolam, polyethylene glycol,  sodium chloride flush Allergies  Allergen Reactions  . Penicillins Swelling    Did it involve swelling of the face/tongue/throat, SOB, or low BP? Y Did it involve sudden or severe rash/hives, skin peeling, or any reaction on the inside of your mouth or nose? N Did you need to seek medical attention at a hospital or doctor's office? Y When did it last happen? 10 years If all above answers are "NO", may proceed with cephalosporin use.   Review  of Systems  Unable to perform ROS: Acuity of condition    Physical Exam Vitals and nursing note reviewed.  Constitutional:      General: He is not in acute distress.    Appearance: He is ill-appearing.     Interventions: He is intubated.  Cardiovascular:     Rate and Rhythm: Normal rate.  Pulmonary:     Effort: No tachypnea or accessory muscle usage. He is intubated.  Abdominal:     Palpations: Abdomen is soft.  Neurological:     Comments: Sedated on vent  Posturing  Shivering noted at times     Vital Signs: BP (!) 123/93   Pulse 90   Temp 99.4 F (37.4 C)   Resp 16   Ht _0  (1.88 m)   Wt 102.5 kg   SpO2 99%   BMI 29.01 kg/m  Pain Scale: CPOT       SpO2: SpO2: 99 % O2 Device:SpO2: 99 % O2 Flow Rate: .   IO: Intake/output summary:   Intake/Output Summary (Last 24 hours) at 06/10/2020 1231 Last data filed at 06/10/2020 1100 Gross per 24 hour  Intake 3521.44 ml  Output 670 ml  Net 2851.44 ml    LBM: Last BM Date:  (unknown) Baseline Weight: Weight: 100 kg Most recent weight: Weight: 102.5 kg     Palliative Assessment/Data:     Time In/Out: 1240-1315, 1430-1450 Time Total: 55 min Greater than 50%  of this time was spent counseling and coordinating care related to the above assessment and plan.  Signed by: Vinie Sill, NP Palliative Medicine Team Pager # [phone] (M-F 8a-5p) Team Phone # [phone] (Nights/Weekends)

## 2020-06-10 NOTE — Procedures (Signed)
Patient Name: Derrick Hoffman  MRN: [id number]  Epilepsy Attending: Charlsie Quest  Referring Physician/Provider:  Duration: 06/09/2020 1223 to 06/10/2020 1223  Patient history: 45 year old male status post cardiac arrest.  EEG generally for seizures.  Level of alertness: comatose  AEDs during EEG study: Keppra, Depakote, propofol  Technical aspects: This EEG study was done with scalp electrodes positioned according to the 10-20 International system of electrode placement. Electrical activity was acquired at a sampling rate of 500Hz  and reviewed with a high frequency filter of 70Hz  and a low frequency filter of 1Hz . EEG data were recorded continuously and digitally stored.   Description: EEG showed generalized periodic discharges with triphasic morphology at 2 to 3 Hz which at times appeared rhythmic with waxing and waning amplitude.  Continuous generalized 3-5 theta-delta slowing was also noted.  Hyperventilation and photic stimulation were not performed.     ABNORMALITY -Periodic discharges with triphasic morphology, generalized -Continuous slow, generalized  IMPRESSION: This study showed periodic discharges with triphasic morphology 2 to 3 Hz which at times are rhythmic with waxing waning amplitude. This pattern is on the ictal-interictal continuum with high potential for seizures.  Additionally there was evidence of severe diffuse encephalopathy, nonspecific etiology.  Nelli Swalley 

## 2020-06-10 NOTE — Plan of Care (Signed)

## 2020-06-10 NOTE — Progress Notes (Addendum)
I updated the sister Sue Lush [phone]; also involved sister Juliette Alcide ACNP-BC [facility] Pulmonary/Critical Care Pager # [phone] OR # [phone] if no answer

## 2020-06-10 NOTE — Progress Notes (Addendum)
Subjective: Girlfriend at bedside.  Patient continued to have episodes of chewing movements overnight.  ROS: Unable to obtain due to poor mental status  Examination  Vital signs in last 24 hours: Temp:  [97.5 F (36.4 C)-99.9 F (37.7 C)] 98.8 F (37.1 C) (02/07 0740) Pulse Rate:  [66-83] 83 (02/07 1000) Resp:  [14-27] 16 (02/07 0333) BP: (100-134)/(67-103) 123/97 (02/07 1000) SpO2:  [97 %-100 %] 99 % (02/07 1000) Arterial Line BP: (88-139)/(70-87) 139/87 (02/07 1000) FiO2 (%):  [30 %-40 %] 30 % (02/07 0817) Weight:  [102.5 kg] 102.5 kg (02/07 0500)  General: lying in bed, not in apparent distress CVS: pulse-normal rate and rhythm RS: Intubated coarse breath sounds bilaterally Extremities: normal, warm Neuro: On propofol at 62mcg/hr, comatose, does not open eyes noxious stimuli, PERRLA, corneal reflex absent, gag reflex present, slight withdraw to noxious stimuli in right upper and lower extremity, no movement in left upper and lower extremity  Basic Metabolic Panel: Recent Labs  Lab 07/03/20 1546 07-03-2020 1546 July 03, 2020 1553 07/03/20 1808 07/03/2020 1954 06/09/20 0400 06/09/20 0721 06/10/20 0402  NA 141  --  143 140 139 140 141 140  K 3.5  --  3.5 2.5* 3.1* 4.8 4.9 3.8  CL 104  --  100  --  100 105  --  106  CO2  --   --  15*  --  19* 20*  --  18*  GLUCOSE 228*  --  236*  --  187* 126*  --  106*  BUN 6  --  7  --  10 11  --  21*  CREATININE 1.30*  --  1.63*  --  1.42* 1.57*  --  1.69*  CALCIUM  --    < > 9.2  --  8.7* 9.1  --  8.9  MG  --   --   --   --  2.5* 2.7*  --  2.5*  PHOS  --   --   --   --   --  4.4  --  3.5   < > = values in this interval not displayed.    CBC: Recent Labs  Lab 07/03/2020 1553 07-03-2020 1808 Jul 03, 2020 1954 06/09/20 0400 06/09/20 0721 06/10/20 0402  WBC 16.0*  --  22.6* 20.3*  --  17.7*  NEUTROABS 9.8*  --   --   --   --   --   HGB 16.9 17.0 16.5 17.2* 18.0* 15.6  HCT 54.2* 50.0 49.1 50.3 53.0* 49.1  MCV 107.5*  --  100.4* 101.2*  --   104.7*  PLT 270  --  284 281  --  209     Coagulation Studies: Recent Labs    07-03-20 1553  LABPROT 14.0  INR 1.1    Imaging CT head without contrast 2020/07/03: No acute intracranial abnormality.  ASSESSMENT AND PLAN: 45 year old male status post cardiac arrest.   Cardiorespiratory arrest Myoclonus Congestive heart failure Alcohol use disorder Nicotine use disorder AKI Leukocytosis Hypermagnesemia Elevated triglycerides -LTM EEG continues to show periodic discharges with triphasic morphology at 2 to 3 Hz  Recommendations -Unable to increase propofol due to elevated triglycerides -We will add Versed drip, will titrate till GPEDs improve -Continue Keppra 1500 mg twice daily, valproic acid 500 mg every 8 hours -Discontinue buspirone as it could potentially make seizures worse.  Can consider resuming if patient improves -Plan for MRI brain at 72 hours/tomorrow -Patient had prolonged downtime, had suspected myoclonic activity on arrival, EEG shows generalized periodic  discharges at 2 to 3 Hz.  This is concerning for severe neurologic injury with likely poor prognosis -Discussed with girlfriend at bedside and critical care team team Zenia Resides, NP.  -Seizure precautions  CRITICAL CARE Performed by: Charlsie Quest   Total critical care time: 35 minutes  Critical care time was exclusive of separately billable procedures and treating other patients.  Critical care was necessary to treat or prevent imminent or life-threatening deterioration.  Critical care was time spent personally by me on the following activities: development of treatment plan with patient and/or surrogate as well as nursing, discussions with consultants, evaluation of patient's response to treatment, examination of patient, obtaining history from patient or surrogate, ordering and performing treatments and interventions, ordering and review of laboratory studies, ordering and review of radiographic  studies, pulse oximetry and re-evaluation of patient's condition.   Lindie Spruce Epilepsy [facility] For questions after 5pm please refer to AMION to reach the Neurologist on call

## 2020-06-10 NOTE — Progress Notes (Signed)
NAME:  Derrick Hoffman, MRN:  [id number], DOB:  Sep 09, 1975, LOS: 2 ADMISSION DATE:  2020/06/23, CONSULTATION DATE:  06-23-2020 REFERRING MD:  Dr.Rees, CHIEF COMPLAINT:  Cardiac arrest   Brief History:  45yo male admitted post cardiac arrest, estimated downtime ~66mins   Past Medical History:  Smoker HTN Steatohepatitis Anxiety Alcohol use  Significant Hospital Events:  06-23-2020 admitted 2/6 developed myoclonic appearing activity. EEG obtained. Showed status. Neuro called. They felt findings c/w anoxic brain injury w/ post-anoxic myoclonic status. Loaded Keppra. Later in evening. GPEDs noted. VPA loaded and keppra increased.  Consults:  Cardiology  Procedures:    Significant Diagnostic Tests:  CXR 2/5 > negative CT head 2/5 > negative CT angio chest/ang/pelvis 2/5 > negative for acute dissection insufficient contrast to evaluate for PE 2/6 eeg: + status epilepticus  Micro Data:  COVID 2/5 >  Antimicrobials:     Interim History / Subjective:  On-going GPEDSs  Objective   Blood pressure 109/77, pulse 78, temperature 98.8 F (37.1 C), resp. rate 16, height 6\' 2"  (1.88 m), weight 102.5 kg, SpO2 100 %.    Vent Mode: PRVC FiO2 (%):  [30 %-40 %] 30 % Set Rate:  [16 bmp] 16 bmp Vt Set:  [650 mL] 650 mL PEEP:  [5 cmH20] 5 cmH20 Plateau Pressure:  [20 cmH20-22 cmH20] 22 cmH20   Intake/Output Summary (Last 24 hours) at 06/10/2020 0950 Last data filed at 06/10/2020 0400 Gross per 24 hour  Intake 3392.19 ml  Output 810 ml  Net 2582.19 ml   Filed Weights   06/23/20 2000 06/09/20 0205 06/10/20 0500  Weight: 100.5 kg 100.5 kg 102.5 kg    Examination:  General 46 year old black male currently heavily sedated and on full ventilator support HEENT LTM monitor in place orally intubated pupils equal reactive Pulmonary: Equal bilateral chest rise decreased bases Cardiac: Regular rate and rhythm Abdomen: Soft nontender GU: Clear yellow Neuro: Unresponsive, has decerebrate posturing,  no current myoclonus noted Extremities warm dry  Resolved Hospital Problem list     Assessment & Plan:  PEA arrest, NSTEMI- in setting of ETOH related systolic CM w/ acute reduction of systolic fxn.  -EF down to 15 to 20% with severely reduced LV function and global hypokinesis as well as grade 3 diastolic dysfunction there is also mild reduced right ventricular systolic function  Also has had intermittent VT since ablated with electrolyte repletion,  Plan Continue telemetry monitoring Continue amiodarone and heparin Additional recommendations per cardiology at this point holding off on further diagnostics given worrisome neurological exam  Acute respiratory failure w/ ventilator dependence s/p PEA arrest Portable chest x-ray personally reviewed: This shows Endotracheal tube in satisfactory position there bibasilar atelectasis, probable element of edema -ABG reviewed -Mental status preventing weaning Plan Full ventilator support PAD protocol, with RASS goal -3 to -4 through today VAP bundle A.m. chest x-ray  Anoxic encephalopathy with myoclonic status- delayed which portends worse prognosis.  Downtime estimated around 30 minutes -Valproic acid loaded in addition to Keppra Plan Continue Keppra and valproic acid Continue LTM Will eventually need MRI Supportive care  Anion gap metabolic acidosis Plan Check lactic acid Avoid hypotension Serial chemistries  AKI Plan Continue IV hydration Renal dose medications Strict intake output Keep Foley catheter in place  Baseline HTN, EtOH cardiomyopathy, MJ abuse, HLD Plan As above   Best practice (evaluated daily)  Diet: start TF Pain/Anxiety/Delirium protocol (if indicated): prop/fent, RASS goal -4 to -5 VAP protocol (if indicated): in place DVT prophylaxis: heparin gtt GI  prophylaxis: ppi Glucose control: ssi Mobility: BR Disposition:ICU  Goals of Care:  Will update as schedule allows Full scope of care with  discussion 06-18-20  Critical care x32 minutes  Simonne Martinet ACNP-BC [facility] Pulmonary/Critical Care Pager # [phone] OR # [phone] if no answer

## 2020-06-10 NOTE — Progress Notes (Signed)
Pt BP spiked up to 170-180 SBP and became tachycardic 100-110s. Pt noted to have shivering activity. RN called Elink for further evaluation and orders. RN titrated Prop up to bring down patient BP. Per Dr. Dellie Catholic, continue to monitor BP and use the prop for management of HTN. If pressure drops, will obtain further orders.

## 2020-06-10 NOTE — Progress Notes (Signed)
ANTICOAGULATION CONSULT NOTE  Pharmacy Consult for heparin Indication: chest pain/ACS  Allergies  Allergen Reactions  . Penicillins Swelling    Did it involve swelling of the face/tongue/throat, SOB, or low BP? Y Did it involve sudden or severe rash/hives, skin peeling, or any reaction on the inside of your mouth or nose? N Did you need to seek medical attention at a hospital or doctor's office? Y When did it last happen? 10 years If all above answers are "NO", may proceed with cephalosporin use.    Patient Measurements: Height: 6\' 2"  (188 cm) Weight: 102.5 kg (225 lb 15.5 oz) IBW/kg (Calculated) : 82.2 Heparin Dosing Weight: 100kg  Vital Signs: Temp: 99.4 F (37.4 C) (02/07 1135) Temp Source: Bladder (02/07 0600) BP: 123/93 (02/07 1100) Pulse Rate: 90 (02/07 1100)  Labs: Recent Labs    Jul 06, 2020 1553 07/06/20 1808 July 06, 2020 1826 07/06/20 1954 06/09/20 0400 06/09/20 0721 06/09/20 1108 06/10/20 0402 06/10/20 0540 06/10/20 1054  HGB 16.9   < >  --  16.5 17.2* 18.0*  --  15.6  --   --   HCT 54.2*   < >  --  49.1 50.3 53.0*  --  49.1  --   --   PLT 270  --   --  284 281  --   --  209  --   --   LABPROT 14.0  --   --   --   --   --   --   --   --   --   INR 1.1  --   --   --   --   --   --   --   --   --   HEPARINUNFRC  --   --   --   --  0.57  --  0.68  --  0.39  --   CREATININE 1.63*  --   --  1.42* 1.57*  --   --  1.69*  --   --   CKTOTAL  --   --   --   --   --   --   --   --   --  2,056*  CKMB  --   --   --   --   --   --   --   --   --  22.5*  TROPONINIHS 241*  --  9,095* 13,260* 18,068*  --   --   --   --   --    < > = values in this interval not displayed.    Estimated Creatinine Clearance: 71.2 mL/min (A) (by C-G formula based on SCr of 1.69 mg/dL (H)).  Assessment: 45 year old male admitted post cardiac arrest with approximately 30 minutes of downtime. Patient now with elevated hs troponin to >9k. Heparin started for ACS. Heparin therapeutic and CBC  stable.  Goal of Therapy:  Heparin level 0.3-0.7 units/ml Monitor platelets by anticoagulation protocol: Yes   Plan:  Continue heparin at 1400 units/hr Daily heparin level and CBC  59, PharmD, [place], [facility] Clinical Pharmacist [phone] Please check AMION for all [facility] Pharmacy numbers 06/10/2020

## 2020-06-10 NOTE — Progress Notes (Signed)
Progress Note  Patient Name: Derrick Hoffman Date of Encounter: 06/10/2020  Primary Cardiologist:   Quay Burow, MD   Subjective   Intubated, cooled.    Inpatient Medications    Scheduled Meds: . acetaminophen  650 mg Oral Q4H   Or  . acetaminophen (TYLENOL) oral liquid 160 mg/5 mL  650 mg Per Tube Q4H   Or  . acetaminophen  650 mg Rectal Q4H  . busPIRone  30 mg Oral Q8H   Or  . busPIRone  30 mg Per Tube Q8H  . chlorhexidine gluconate (MEDLINE KIT)  15 mL Mouth Rinse BID  . Chlorhexidine Gluconate Cloth  6 each Topical Daily  . feeding supplement (PROSource TF)  45 mL Per Tube BID  . feeding supplement (VITAL HIGH PROTEIN)  1,000 mL Per Tube Q24H  . insulin aspart  0-20 Units Subcutaneous Q4H  . mouth rinse  15 mL Mouth Rinse 10 times per day  . pantoprazole (PROTONIX) IV  40 mg Intravenous QHS  . sodium chloride flush  10-40 mL Intracatheter Q12H   Continuous Infusions: . sodium chloride 10 mL/hr at 06/10/20 0400  . amiodarone 30 mg/hr (06/10/20 0400)  . fentaNYL infusion INTRAVENOUS 100 mcg/hr (06/10/20 0600)  . heparin 1,400 Units/hr (06/10/20 0400)  . levETIRAcetam 1,500 mg (06/10/20 0532)  . norepinephrine (LEVOPHED) Adult infusion Stopped (06/09/20 1106)  . propofol (DIPRIVAN) infusion 20 mcg/kg/min (06/10/20 4599)  . valproate sodium 500 mg (06/10/20 0409)   PRN Meds: sodium chloride, docusate sodium, fentaNYL, midazolam, polyethylene glycol, sodium chloride flush   Vital Signs    Vitals:   06/10/20 0400 06/10/20 0500 06/10/20 0600 06/10/20 0740  BP: 111/83 111/84 109/77   Pulse: 82 79 78   Resp:      Temp: 98.2 F (36.8 C)  99.9 F (37.7 C) 98.8 F (37.1 C)  TempSrc: Bladder  Bladder   SpO2: 100% 100% 100%   Weight:  102.5 kg    Height:        Intake/Output Summary (Last 24 hours) at 06/10/2020 0926 Last data filed at 06/10/2020 0400 Gross per 24 hour  Intake 3392.19 ml  Output 810 ml  Net 2582.19 ml   Filed Weights   06/18/2020 2000 06/09/20  0205 06/10/20 0500  Weight: 100.5 kg 100.5 kg 102.5 kg    Telemetry    NSR - Personally Reviewed  ECG    NA - Personally Reviewed  Physical Exam   GEN:    Critically ill appearing Neck: No  JVD Cardiac: RRR, no murmurs, rubs, or gallops.   (Difficult exam) Respiratory:     Decreased breath sounds GI: Soft, nontender, non-distended  MS: No edema; No deformity. Neuro:  Intubated, cooled   Labs    Chemistry Recent Labs  Lab 07/01/2020 1553 06/06/2020 1808 06/16/2020 1954 06/09/20 0400 06/09/20 0721 06/10/20 0402  NA 143   < > 139 140 141 140  K 3.5   < > 3.1* 4.8 4.9 3.8  CL 100  --  100 105  --  106  CO2 15*  --  19* 20*  --  18*  GLUCOSE 236*  --  187* 126*  --  106*  BUN 7  --  10 11  --  21*  CREATININE 1.63*  --  1.42* 1.57*  --  1.69*  CALCIUM 9.2  --  8.7* 9.1  --  8.9  PROT 8.2*  --   --   --   --  7.2  ALBUMIN  4.3  --   --   --   --  3.7  AST 471*  --   --   --   --  199*  ALT 286*  --   --   --   --  122*  ALKPHOS 123  --   --   --   --  107  BILITOT 1.3*  --   --   --   --  1.3*  GFRNONAA 53*  --  >60 55*  --  51*  ANIONGAP 28*  --  20* 15  --  16*   < > = values in this interval not displayed.     Hematology Recent Labs  Lab 06/20/2020 1954 06/09/20 0400 06/09/20 0721 06/10/20 0402  WBC 22.6* 20.3*  --  17.7*  RBC 4.89 4.97  --  4.69  HGB 16.5 17.2* 18.0* 15.6  HCT 49.1 50.3 53.0* 49.1  MCV 100.4* 101.2*  --  104.7*  MCH 33.7 34.6*  --  33.3  MCHC 33.6 34.2  --  31.8  RDW 15.1 15.6*  --  16.0*  PLT 284 281  --  209    Cardiac EnzymesNo results for input(s): TROPONINI in the last 168 hours. No results for input(s): TROPIPOC in the last 168 hours.   BNPNo results for input(s): BNP, PROBNP in the last 168 hours.   DDimer No results for input(s): DDIMER in the last 168 hours.   Radiology    CT Head Wo Contrast  Result Date: 06/07/2020 CLINICAL DATA:  Headache.  Intracranial hemorrhage suspected. EXAM: CT HEAD WITHOUT CONTRAST TECHNIQUE:  Contiguous axial images were obtained from the base of the skull through the vertex without intravenous contrast. COMPARISON:  None. FINDINGS: Brain: No evidence of acute infarction, hemorrhage, hydrocephalus, extra-axial collection or mass lesion/mass effect. Vascular: Hyperdense vessels likely due to administration of contrast for concurrent chest CT angiogram. Skull: Normal. Negative for fracture or focal lesion. Sinuses/Orbits: Partial opacification of the ethmoid sinuses. Other: None. IMPRESSION: 1. No acute intracranial abnormality. 2. Hyperdense intracranial vessels likely due to administration of contrast for concurrent chest CT angiogram. Electronically Signed   By: Fidela Salisbury M.D.   On: 06/17/2020 16:34   DG Chest Port 1 View  Result Date: 06/09/2020 CLINICAL DATA:  45 year old male loss of consciousness, intubated. EXAM: PORTABLE CHEST 1 VIEW COMPARISON:  CT Chest, Abdomen, and Pelvis today are reported separately. 06/21/2020 and earlier. FINDINGS: Portable AP semi upright view at 0441 hours. More kyphotic positioning. ETT tip appear stable the level the clavicles. Enteric tube courses to the abdomen as before, tip not included. New left IJ approach central line, tip projects about 3 vertebral bodies below the carina near the level of the diaphragm. New resuscitation or pacer pads project over the lower chest. Mediastinal contours appear stable. No pneumothorax. Lower lung volumes with crowding of lung markings. No pleural effusion or consolidation is evident. No acute osseous abnormality identified. Paucity bowel gas in the upper abdomen. IMPRESSION: 1. Left IJ central line placed. Tip is at the right atrium level given the current lung volumes. Recommend retraction of 1-2 cm and attention on follow-up. 2.  Otherwise stable lines and tubes. 3. Lower lung volumes with perihilar and basilar atelectasis suspected. No pneumothorax. Electronically Signed   By: Genevie Ann M.D.   On: 06/09/2020 06:41    DG Chest Portable 1 View  Result Date: 06/17/2020 CLINICAL DATA:  Post intubation. EXAM: PORTABLE CHEST 1 VIEW COMPARISON:  March 03, 2020  FINDINGS: There has been an interval intubation. The endotracheal tube tip in satisfactory position. Cardiomediastinal silhouette is normal. Mediastinal contours appear intact. There is no evidence of focal airspace consolidation, pleural effusion or pneumothorax. Osseous structures are without acute abnormality. Soft tissues are grossly normal. IMPRESSION: 1. Interval intubation. 2. No evidence of acute cardiopulmonary disease. Electronically Signed   By: Fidela Salisbury M.D.   On: 06/06/2020 15:54   EEG adult  Result Date: 06/09/2020 Greta Doom, MD     06/09/2020  1:10 PM History: 45 year old male status post cardiac arrest with some concern for "myoclonus" Sedation: None Technique: This is a 21 channel routine scalp EEG performed at the bedside with bipolar and monopolar montages arranged in accordance to the international 10/20 system of electrode placement. One channel was dedicated to EKG recording. Background: There is nearly continuous 1 to 2 Hz generalized, frontocentral predominant discharges with triphasic morphology though there is not definite evolution and frequency, there is spread in field and evolution and morphology.  These periods of spread/evolution are interspersed with slow periodic discharges with a 1 Hz frequency.  There is at least one episode where the frequency approaches 3 Hz concerning for seizure. Photic stimulation: Physiologic driving is now performed EEG Abnormalities: 1) generalized periodic discharges with triphasic morphology 2) discrete episodes of spread in field/evolution and morphology concerning for ongoing seizure activity Clinical Interpretation: This EEG recorded a pattern on the ictal-interictal continuum, though there are features that I find concerning for an ictal nature to these discharges(e.g. status  epilepticus).  Clinical correlation is advised, would consider benzodiazepine challenge.  Unfortunately due to technical reasons, I was unable to review the video associated with this EEG. Roland Rack, MD [facility] [phone] If 7pm- 7am, please page neurology on call as listed in [place].   ECHOCARDIOGRAM COMPLETE  Result Date: 06/09/2020    ECHOCARDIOGRAM REPORT   Patient Name:   Derrick Hoffman Date of Exam: 06/09/2020 Medical Rec #:  [id number]     Height:       74.0 in Accession #:    [id number]    Weight:       221.6 lb Date of Birth:  [birth year]     BSA:          2.270 m Patient Age:    66 years      BP:           140/85 mmHg Patient Gender: M             HR:           79 bpm. Exam Location:  Inpatient Procedure: 2D Echo, Cardiac Doppler, Color Doppler and Intracardiac            Opacification Agent STAT ECHO Indications:    Cardiac arrest  History:        Patient has prior history of Echocardiogram examinations, most                 recent 03/05/2020. Risk Factors:Hypertension and Current Smoker.                 ETOH use.  Sonographer:    Clayton Lefort RDCS (AE) Referring Phys: [id number] Candee Furbish  Sonographer Comments: Echo performed with patient supine and on artificial respirator. IMPRESSIONS  1. Left ventricular ejection fraction, by estimation, is 15-20%. The left ventricle has severely decreased function. The left ventricle demonstrates global hypokinesis. The left ventricular internal cavity size was moderately dilated. There is moderate left  ventricular hypertrophy. Left ventricular diastolic parameters are consistent with Grade III diastolic dysfunction (restrictive).  2. Right ventricular systolic function is mildly reduced. The right ventricular size is normal. Tricuspid regurgitation signal is inadequate for assessing PA pressure.  3. The mitral valve is normal in structure. Mild mitral valve regurgitation. No evidence of mitral stenosis.  4. The aortic valve is tricuspid.  Aortic valve regurgitation is trivial. No aortic stenosis is present.  5. The inferior vena cava is normal in size with <50% respiratory variability, suggesting right atrial pressure of 8 mmHg. Comparison(s): A prior study was performed on 03/05/20. Prior images reviewed side by side. LVEF has decreased. Conclusion(s)/Recommendation(s): No left ventricular mural or apical thrombus/thrombi. FINDINGS  Left Ventricle: Left ventricular ejection fraction, by estimation, is 15-20%. The left ventricle has severely decreased function. The left ventricle demonstrates global hypokinesis. Definity contrast agent was given IV to delineate the left ventricular endocardial borders. The left ventricular internal cavity size was moderately dilated. There is moderate left ventricular hypertrophy. Left ventricular diastolic parameters are consistent with Grade III diastolic dysfunction (restrictive). Right Ventricle: The right ventricular size is normal. No increase in right ventricular wall thickness. Right ventricular systolic function is mildly reduced. Tricuspid regurgitation signal is inadequate for assessing PA pressure. The tricuspid regurgitant velocity is 1.62 m/s, and with an assumed right atrial pressure of 8 mmHg, the estimated right ventricular systolic pressure is 38.9 mmHg. Left Atrium: Left atrial size was normal in size. Right Atrium: Right atrial size was normal in size. Pericardium: There is no evidence of pericardial effusion. Mitral Valve: The mitral valve is normal in structure. Mild mitral valve regurgitation. No evidence of mitral valve stenosis. MV peak gradient, 1.4 mmHg. The mean mitral valve gradient is 0.0 mmHg. Tricuspid Valve: The tricuspid valve is normal in structure. Tricuspid valve regurgitation is trivial. No evidence of tricuspid stenosis. Aortic Valve: The aortic valve is tricuspid. Aortic valve regurgitation is trivial. No aortic stenosis is present. Aortic valve mean gradient measures 2.0 mmHg.  Aortic valve peak gradient measures 3.1 mmHg. Aortic valve area, by VTI measures 2.40 cm. Pulmonic Valve: The pulmonic valve was not well visualized. Pulmonic valve regurgitation is not visualized. No evidence of pulmonic stenosis. Aorta: The aortic root is normal in size and structure. Venous: The inferior vena cava is normal in size with less than 50% respiratory variability, suggesting right atrial pressure of 8 mmHg. IAS/Shunts: The interatrial septum was not well visualized.  LEFT VENTRICLE PLAX 2D LVIDd:         6.30 cm  Diastology LVIDs:         5.60 cm  LV e' medial:    4.56 cm/s LV PW:         1.40 cm  LV E/e' medial:  12.2 LV IVS:        1.50 cm  LV e' lateral:   4.00 cm/s LVOT diam:     2.10 cm  LV E/e' lateral: 13.9 LV SV:         31 LV SV Index:   14 LVOT Area:     3.46 cm                          3D Volume EF:                         3D EF:        26 %  LV EDV:       292 ml                         LV ESV:       215 ml                         LV SV:        77 ml RIGHT VENTRICLE          IVC RV Basal diam:  3.60 cm  IVC diam: 1.80 cm RV Mid diam:    3.50 cm TAPSE (M-mode): 1.6 cm LEFT ATRIUM             Index       RIGHT ATRIUM           Index LA diam:        4.40 cm 1.94 cm/m  RA Area:     18.20 cm LA Vol (A2C):   67.2 ml 29.60 ml/m RA Volume:   55.70 ml  24.54 ml/m LA Vol (A4C):   49.9 ml 21.98 ml/m LA Biplane Vol: 60.8 ml 26.78 ml/m  AORTIC VALVE AV Area (Vmax):    2.75 cm AV Area (Vmean):   2.30 cm AV Area (VTI):     2.40 cm AV Vmax:           88.60 cm/s AV Vmean:          65.700 cm/s AV VTI:            0.131 m AV Peak Grad:      3.1 mmHg AV Mean Grad:      2.0 mmHg LVOT Vmax:         70.40 cm/s LVOT Vmean:        43.700 cm/s LVOT VTI:          0.091 m LVOT/AV VTI ratio: 0.69  AORTA Ao Root diam: 3.70 cm Ao Asc diam:  3.60 cm MITRAL VALVE               TRICUSPID VALVE MV Area (PHT): 5.13 cm    TR Peak grad:   10.5 mmHg MV Area VTI:   1.97 cm    TR Vmax:         162.00 cm/s MV Peak grad:  1.4 mmHg MV Mean grad:  0.0 mmHg    SHUNTS MV Vmax:       0.60 m/s    Systemic VTI:  0.09 m MV Vmean:      30.6 cm/s   Systemic Diam: 2.10 cm MV Decel Time: 148 msec MV E velocity: 55.70 cm/s MV A velocity: 26.90 cm/s MV E/A ratio:  2.07 Cherlynn Kaiser MD Electronically signed by Cherlynn Kaiser MD Signature Date/Time: 06/09/2020/10:32:37 AM    Final    CT Angio Chest/Abd/Pel for Dissection W and/or W/WO  Result Date: 06/05/2020 CLINICAL DATA:  Chest pain, loss of consciousness EXAM: CT ANGIOGRAPHY CHEST, ABDOMEN AND PELVIS TECHNIQUE: Non-contrast CT of the chest was initially obtained. Multidetector CT imaging through the chest, abdomen and pelvis was performed using the standard protocol during bolus administration of intravenous contrast. Multiplanar reconstructed images and MIPs were obtained and reviewed to evaluate the vascular anatomy. CONTRAST:  148m OMNIPAQUE IOHEXOL 350 MG/ML SOLN COMPARISON:  03/03/2020 FINDINGS: CTA CHEST FINDINGS Cardiovascular: No evidence of thoracic aortic aneurysm or dissection. Common origin of the innominate artery and left common carotid artery from the aortic arch.  Mild cardiomegaly unchanged. No pericardial effusion. Insufficient pulmonary arterial contrast enhancement to evaluate for pulmonary emboli. Mediastinum/Nodes: Endotracheal tube well above carina. Enteric catheter extends into the gastric lumen. No pathologic adenopathy. Lungs/Pleura: Dependent lower lobe atelectasis. No acute airspace disease, effusion, or pneumothorax. Central airways are patent. Musculoskeletal: No acute or destructive bony lesions. Reconstructed images demonstrate no additional findings. Review of the MIP images confirms the above findings. CTA ABDOMEN AND PELVIS FINDINGS VASCULAR Aorta: Normal caliber aorta without aneurysm, dissection, vasculitis or significant stenosis. Celiac: Patent without evidence of aneurysm, dissection, vasculitis or significant stenosis.  SMA: Patent without evidence of aneurysm, dissection, vasculitis or significant stenosis. Renals: Both renal arteries are patent without evidence of aneurysm, dissection, vasculitis, fibromuscular dysplasia or significant stenosis. There is an accessory left renal artery supplying the lower pole, with mild narrowing at its origin unchanged since previous. IMA: Patent without evidence of aneurysm, dissection, vasculitis or significant stenosis. Inflow: Patent without evidence of aneurysm, dissection, vasculitis or significant stenosis. Minimal atherosclerosis. Veins: No obvious venous abnormality within the limitations of this arterial phase study. Review of the MIP images confirms the above findings. NON-VASCULAR Hepatobiliary: Diffuse hepatic steatosis unchanged. No focal liver abnormality. Gallbladder is unremarkable. Pancreas: Unremarkable. No pancreatic ductal dilatation or surrounding inflammatory changes. Spleen: Normal in size without focal abnormality. Adrenals/Urinary Tract: Adrenal glands are unremarkable. Kidneys are normal, without renal calculi, focal lesion, or hydronephrosis. Bladder is unremarkable. Stomach/Bowel: No bowel obstruction or ileus. No bowel wall thickening or inflammatory change. The appendix is surgically absent. Lymphatic: No pathologic adenopathy. Reproductive: Prostate is unremarkable. Other: No free fluid or free gas. No abdominal wall hernia. Musculoskeletal: No acute or destructive bony lesions. Reconstructed images demonstrate no additional findings. Review of the MIP images confirms the above findings. IMPRESSION: 1. No evidence of thoracic or abdominal aortic aneurysm or dissection. 2. Insufficient pulmonary arterial contrast enhancement to evaluate for pulmonary emboli. 3. Diffuse hepatic steatosis. Electronically Signed   By: Randa Ngo M.D.   On: 06/23/2020 16:36    Cardiac Studies   ECHO:  1. Left ventricular ejection fraction, by estimation, is 15-20%. The left   ventricle has severely decreased function. The left ventricle demonstrates  global hypokinesis. The left ventricular internal cavity size was  moderately dilated. There is moderate  left ventricular hypertrophy. Left ventricular diastolic parameters are  consistent with Grade III diastolic dysfunction (restrictive).  2. Right ventricular systolic function is mildly reduced. The right  ventricular size is normal. Tricuspid regurgitation signal is inadequate  for assessing PA pressure.  3. The mitral valve is normal in structure. Mild mitral valve  regurgitation. No evidence of mitral stenosis.  4. The aortic valve is tricuspid. Aortic valve regurgitation is trivial.  No aortic stenosis is present.  5. The inferior vena cava is normal in size with <50% respiratory  variability, suggesting right atrial pressure of 8 mmHg.   Patient Profile     45 y.o. male with past medical history of chronic combined systolic and diastolic CHF/NICM (EF 34-19% by echo in 09/2019 with cath showing normal cors, EF similar by repeat echo in 03/2020), alcohol abuse and tobacco use who is being seen for the evaluation of cardiac arrest at the request of Dr. Ralene Bathe.   Assessment & Plan    CARDIAC ARREST:  Primary arrhythmic possible. Neuro evaluation in process and results look poor.    CHRONIC SYSTOLIC HF:  Echo images reviewed.  EF is severely and globally reduced.  Non ischemic previously.  No ischemic work planned  at this point.  Markedly elevated troponin could be secondary to arrest and shock.  Cannot exclude acute inflammatory process.  Marland Kitchen  He is not a candidate for advanced therapies.   Hemodynamically stable.   We might be able to titrate low dose beta blocker in the next 12 - 24 hours.  Would avoid ACE/ARB/ARNI.  Note EKG is consistent with acute neuro injury.  QT prolonged so avoid further QT prolonging drugs.  OK to continue amiodarone for now.       AKI:  Likely ATN with acute arrest.    Follow.     ELEVATED LIVER ENZYMES:  Likely "shock liver".  Follow.   For questions or updates, please contact [place] Please consult [email or web address] for contact info under Cardiology/STEMI.   Signed, Minus Breeding, MD  06/10/2020, 9:27 AM

## 2020-06-10 NOTE — Progress Notes (Signed)
LTM maintenance completed; no skin breakdown was seen at Fp1, Fp2, or A1. Also, turned video back on.

## 2020-06-11 ENCOUNTER — Inpatient Hospital Stay (HOSPITAL_COMMUNITY): Payer: Self-pay

## 2020-06-11 DIAGNOSIS — J9600 Acute respiratory failure, unspecified whether with hypoxia or hypercapnia: Secondary | ICD-10-CM

## 2020-06-11 LAB — BASIC METABOLIC PANEL
Anion gap: 12 (ref 5–15)
Anion gap: 10 (ref 5–15)
BUN: 23 mg/dL — ABNORMAL HIGH (ref 6–20)
BUN: 17 mg/dL (ref 6–20)
CO2: 22 mmol/L (ref 22–32)
CO2: 24 mmol/L (ref 22–32)
Calcium: 9 mg/dL (ref 8.9–10.3)
Calcium: 8.8 mg/dL — ABNORMAL LOW (ref 8.9–10.3)
Chloride: 104 mmol/L (ref 98–111)
Chloride: 104 mmol/L (ref 98–111)
Creatinine, Ser: 1.06 mg/dL (ref 0.61–1.24)
Creatinine, Ser: 1.03 mg/dL (ref 0.61–1.24)
GFR, Estimated: >60 mL/min (ref >60)
GFR, Estimated: >60 mL/min (ref >60)
Glucose, Bld: 102 mg/dL — ABNORMAL HIGH (ref 70–99)
Glucose, Bld: 92 mg/dL (ref 70–99)
Potassium: 3.9 mmol/L (ref 3.5–5.1)
Potassium: 4.1 mmol/L (ref 3.5–5.1)
Sodium: 138 mmol/L (ref 135–145)
Sodium: 138 mmol/L (ref 135–145)

## 2020-06-11 LAB — PHOSPHORUS: Phosphorus: 3.6 mg/dL (ref 2.5–4.6)

## 2020-06-11 LAB — CBC
HCT: 45.8 % (ref 39.0–52.0)
Hemoglobin: 15 g/dL (ref 13.0–17.0)
MCH: 34.5 pg — ABNORMAL HIGH (ref 26.0–34.0)
MCHC: 32.8 g/dL (ref 30.0–36.0)
MCV: 105.3 fL — ABNORMAL HIGH (ref 80.0–100.0)
Platelets: 206 10*3/uL (ref 150–400)
RBC: 4.35 MIL/uL (ref 4.22–5.81)
RDW: 16.2 % — ABNORMAL HIGH (ref 11.5–15.5)
WBC: 12.6 10*3/uL — ABNORMAL HIGH (ref 4.0–10.5)
nRBC: 0 % (ref 0.0–0.2)

## 2020-06-11 LAB — HEPARIN LEVEL (UNFRACTIONATED): Heparin Unfractionated: 0.29 IU/mL — ABNORMAL LOW (ref 0.30–0.70)

## 2020-06-11 LAB — HEPATIC FUNCTION PANEL
ALT: 97 U/L — ABNORMAL HIGH (ref 0–44)
AST: 188 U/L — ABNORMAL HIGH (ref 15–41)
Albumin: 3.4 g/dL — ABNORMAL LOW (ref 3.5–5.0)
Alkaline Phosphatase: 107 U/L (ref 38–126)
Bilirubin, Direct: 0.9 mg/dL — ABNORMAL HIGH (ref 0.0–0.2)
Indirect Bilirubin: 1 mg/dL — ABNORMAL HIGH (ref 0.3–0.9)
Total Bilirubin: 1.9 mg/dL — ABNORMAL HIGH (ref 0.3–1.2)
Total Protein: 7.2 g/dL (ref 6.5–8.1)

## 2020-06-11 LAB — GLUCOSE, CAPILLARY
Glucose-Capillary: 103 mg/dL — ABNORMAL HIGH (ref 70–99)
Glucose-Capillary: 109 mg/dL — ABNORMAL HIGH (ref 70–99)
Glucose-Capillary: 110 mg/dL — ABNORMAL HIGH (ref 70–99)
Glucose-Capillary: 63 mg/dL — ABNORMAL LOW (ref 70–99)
Glucose-Capillary: 79 mg/dL (ref 70–99)
Glucose-Capillary: 81 mg/dL (ref 70–99)
Glucose-Capillary: 92 mg/dL (ref 70–99)
Glucose-Capillary: 97 mg/dL (ref 70–99)

## 2020-06-11 LAB — MAGNESIUM: Magnesium: 2.2 mg/dL (ref 1.7–2.4)

## 2020-06-11 MED ORDER — DEXTROSE 50 % IV SOLN
25.0000 mL | Freq: Once | INTRAVENOUS | Status: AC
  Administered 2020-06-11: 25 mL via INTRAVENOUS
  Filled 2020-06-11: qty 50

## 2020-06-11 MED ORDER — CHLORHEXIDINE GLUCONATE 0.12 % MT SOLN
OROMUCOSAL | Status: AC
  Filled 2020-06-11: qty 15

## 2020-06-11 MED ORDER — AMIODARONE HCL 200 MG PO TABS
200.0000 mg | ORAL_TABLET | Freq: Two times a day (BID) | ORAL | Status: DC
  Administered 2020-06-11 – 2020-06-12 (×3): 200 mg
  Filled 2020-06-11 (×3): qty 1

## 2020-06-11 NOTE — Progress Notes (Signed)
MRI results reviewed with father and ex-wife at bedside- diffuse anoxic injury present. Family members are still traveling to [facility] and they have plans to transition to comfort care in the future. All questions answered.  Steffanie Dunn, DO 06/11/20 5:46 PM [facility]

## 2020-06-11 NOTE — Progress Notes (Signed)
Goals of Care Discussion  People included:  -Derrick Hoffman (father) -Sisters Sue Lush and Alphonzo Lemmings   Also included an aunt and ex-wife.   We discussed the following: the patients current physical findings, EEG results and concern about anoxic brain injury and what that means to Derrick Hoffman's future. They raised concern that they would not want him to have chronic trach or live his life in possible persistent vegetative state. They all agreed to DNR should he suffer a cardiac arrest.   At this point plan is: full DNR but to proceed w/ supportive care and await MRI results. The family seems to be leaning towards extubation and palliative approach but I think they would be more reassured in this decision if the MRI also exhibits evidence of anoxic brain injury. They are also awaiting the arrival of one son who will  Be arriving later tonight and also awaiting feedback from the [facility] re: another son currently serving in the armed forces and stationed in [place].   Simonne Martinet ACNP-BC [facility] Pulmonary/Critical Care Pager # [phone] OR # [phone] if no answer

## 2020-06-11 NOTE — Progress Notes (Signed)
NAME:  ARYA BOXLEY, MRN:  [id number], DOB:  December 12, 1975, LOS: 3 ADMISSION DATE:  06/15/20, CONSULTATION DATE:  15-Jun-2020 REFERRING MD:  Dr.Rees, CHIEF COMPLAINT:  Cardiac arrest   Brief History:  45yo male admitted post cardiac arrest, estimated downtime ~9mins   Past Medical History:  Smoker HTN Steatohepatitis Anxiety Alcohol use  Significant Hospital Events:  06/15/20 admitted 2/6 developed myoclonic appearing activity. EEG obtained. Showed status. Neuro called. They felt findings c/w anoxic brain injury w/ post-anoxic myoclonic status. Loaded Keppra. Later in evening. GPEDs noted. VPA loaded and keppra increased.  Consults:  Cardiology  Procedures:    Significant Diagnostic Tests:  CXR 2/5 > negative CT head 2/5 > negative CT angio chest/ang/pelvis 2/5 > negative for acute dissection insufficient contrast to evaluate for PE 2/6 eeg: + status epilepticus  Micro Data:  COVID 2/5 >  Antimicrobials:     Interim History / Subjective:  Sedated Still on heavy sedation for burst suppression  Objective   Blood pressure (Abnormal) 150/96, pulse 95, temperature 99 F (37.2 C), temperature source Bladder, resp. rate 12, height 6\' 2"  (1.88 m), weight 102.5 kg, SpO2 97 %.    Vent Mode: PSV;CPAP FiO2 (%):  [30 %] 30 % Set Rate:  [12 bmp] 12 bmp Vt Set:  [650 mL] 650 mL PEEP:  [5 cmH20] 5 cmH20 Pressure Support:  [10 cmH20] 10 cmH20 Plateau Pressure:  [10 cmH20-26 cmH20] 23 cmH20   Intake/Output Summary (Last 24 hours) at 06/11/2020 08/09/2020 Last data filed at 06/11/2020 0900 Gross per 24 hour  Intake 5415.75 ml  Output 20 ml  Net 5395.75 ml   Filed Weights   2020-06-15 2000 06/09/20 0205 06/10/20 0500  Weight: 100.5 kg 100.5 kg 102.5 kg    Examination:  General this is a 45 year old male. He is sedated still on LTM w/ high doses of sedation HENT NCAT no JVD orally intubated Pulm clear dec bases Card RRR abd soft not tender  Ext warm and dry  gu cl yellow  Neuro  unresponsive. Pupils equal and reactive. No cough or gag  Resolved Hospital Problem list   AKI resolved w/ IVFs 2/8 Anion gap metabolic acidosis w/ lactic acidosis-->Resolves as of 2/8  Assessment & Plan:  PEA arrest, NSTEMI- in setting of ETOH related systolic CM w/ acute reduction of systolic fxn.  -EF down to 15 to 20% with severely reduced LV function and global hypokinesis as well as grade 3 diastolic dysfunction there is also mild reduced right ventricular systolic function  Also has had intermittent VT since ablated with electrolyte repletion,  Plan Cont tele Cont amio and heparin Not a candidate for cardiac eval at this point given what appears to be sig anoxic injury   Acute respiratory failure w/ ventilator dependence s/p PEA arrest pcxr w/ ett and CVL ok. Has low volume film. Bibasilar atx. Can't exclude element of edema  -Mental status preventing weaning Plan Cont full vent support  Currently RASS goal - 4 w/ neuro trying to achieve burst suppression w/ versed VAP bundle Will get respiratory culture for purulent sputum   Anoxic encephalopathy with myoclonic status- delayed which portends worse prognosis.  Downtime estimated around 30 minutes -Valproic acid loaded in addition to Keppra-->Versed gtt started and prop increased 2/7--->EEG looking a little better. prop dosing decreased  Plan LTM per neuro Cont keppra and valproic acid Dec sedation dosing.  Needs MRI-->will discuss timing w. Neuro  Planning for GOC discussion at 1 pm today  TTM  to avoid fever  Baseline HTN, EtOH cardiomyopathy, MJ abuse, HLD Plan As above  Best practice (evaluated daily)  Diet: start TF Pain/Anxiety/Delirium protocol (if indicated): prop/fent, RASS goal -4 to -5 VAP protocol (if indicated): in place DVT prophylaxis: heparin gtt GI prophylaxis: ppi Glucose control: ssi Mobility: BR Disposition:ICU  Goals of Care:  Will update as schedule allows Full scope of care with  discussion 06/15/2020  My cct 32 min  Simonne Martinet ACNP-BC [facility] Pulmonary/Critical Care Pager # [phone] OR # [phone] if no answer

## 2020-06-11 NOTE — Plan of Care (Signed)
  Problem: Clinical Measurements: Goal: Respiratory complications will improve Outcome: Progressing Goal: Cardiovascular complication will be avoided Outcome: Progressing   Problem: Nutrition: Goal: Adequate nutrition will be maintained Outcome: Progressing   Problem: Coping: Goal: Level of anxiety will decrease Outcome: Progressing   Problem: Elimination: Goal: Will not experience complications related to bowel motility Outcome: Progressing Goal: Will not experience complications related to urinary retention Outcome: Progressing   Problem: Pain Managment: Goal: General experience of comfort will improve Outcome: Progressing   Problem: Safety: Goal: Ability to remain free from injury will improve Outcome: Progressing   Problem: Skin Integrity: Goal: Risk for impaired skin integrity will decrease Outcome: Progressing   Problem: Education: Goal: Knowledge of General Education information will improve Description: Including pain rating scale, medication(s)/side effects and non-pharmacologic comfort measures Outcome: Not Progressing   Problem: Health Behavior/Discharge Planning: Goal: Ability to manage health-related needs will improve Outcome: Not Progressing   Problem: Clinical Measurements: Goal: Ability to maintain clinical measurements within normal limits will improve Outcome: Not Progressing Goal: Will remain free from infection Outcome: Not Progressing Goal: Diagnostic test results will improve Outcome: Not Progressing   Problem: Activity: Goal: Risk for activity intolerance will decrease Outcome: Not Progressing   

## 2020-06-11 NOTE — Progress Notes (Signed)
Subjective: No acute events overnight.  Patient's girlfriend at bedside.   ROS: Unable to obtain due to poor mental status  Examination  Vital signs in last 24 hours: Temp:  [96 F (35.6 C)-100.2 F (37.9 C)] 99 F (37.2 C) (02/08 0800) Pulse Rate:  [72-106] 95 (02/08 0905) Resp:  [12-16] 12 (02/08 0400) BP: (105-165)/(72-114) 150/96 (02/08 0900) SpO2:  [92 %-99 %] 97 % (02/08 0907) Arterial Line BP: (101-169)/(69-101) 159/96 (02/08 0905) FiO2 (%):  [30 %] 30 % (02/08 0907)  General: lying in bed, not in apparent distress CVS: pulse-normal rate and rhythm RS: Intubated coarse breath sounds bilaterally Extremities: normal, warm Neuro: On propofol at 4mcg/hr and versed @5ml /hr, comatose, does not open eyes noxious stimuli, PERRLA, corneal reflex absent, gag reflex absent, does not withdraw to noxious stimuli in all 4 extremities  Basic Metabolic Panel: Recent Labs  Lab 2020/06/18 1954 06/09/20 0400 06/09/20 0721 06/10/20 0402 06/10/20 1520 06/10/20 1654 06/11/20 0355  NA 139 140 141 140 140 140 138  K 3.1* 4.8 4.9 3.8 4.4 3.6 3.9  CL 100 105  --  106  --  106 104  CO2 19* 20*  --  18*  --  22 22  GLUCOSE 187* 126*  --  106*  --  110* 102*  BUN 10 11  --  21*  --  22* 23*  CREATININE 1.42* 1.57*  --  1.69*  --  1.29* 1.06  CALCIUM 8.7* 9.1  --  8.9  --  8.8* 9.0  MG 2.5* 2.7*  --  2.5*  --   --  2.2  PHOS  --  4.4  --  3.5  --   --  3.6    CBC: Recent Labs  Lab 2020-06-18 1553 06/18/20 1808 2020/06/18 1954 06/09/20 0400 06/09/20 0721 06/10/20 0402 06/10/20 1520 06/11/20 0355  WBC 16.0*  --  22.6* 20.3*  --  17.7*  --  12.6*  NEUTROABS 9.8*  --   --   --   --   --   --   --   HGB 16.9   < > 16.5 17.2* 18.0* 15.6 16.7 15.0  HCT 54.2*   < > 49.1 50.3 53.0* 49.1 49.0 45.8  MCV 107.5*  --  100.4* 101.2*  --  104.7*  --  105.3*  PLT 270  --  284 281  --  209  --  206   < > = values in this interval not displayed.     Coagulation Studies: Recent Labs     June 18, 2020 1553  LABPROT 14.0  INR 1.1    Imaging MRI brain ordered and pending  ASSESSMENT AND PLAN:  45 year old male status post cardiac arrest.   Cardiorespiratory arrest -LTM EEG improved after adding Versed yesterday  Recommendations -Reduce propofol to 40mcg/hr -Continue Versed drip at 5 m/per hour for now - We will obtain MRI brain without contrast for anoxic/hypoxic brain injury -We will plan to wean off propofol and Versed after MRI brain -Continue Keppra 1500 mg twice daily, valproic acid 500 mg every 8 hours -Patient had prolonged downtime, had suspected myoclonic activity on arrival, EEG shows generalized periodic discharges at 2 to 3 Hz.  This is concerning for severe neurologic injury with likely poor prognosis -Critical care team planning on family meeting later today -Seizure precautions  CRITICAL CARE Performed by: 12m   Total critical care time: 35 minutes  Critical care time was exclusive of separately billable procedures and treating  other patients.  Critical care was necessary to treat or prevent imminent or life-threatening deterioration.  Critical care was time spent personally by me on the following activities: development of treatment plan with patient and/or surrogate as well as nursing, discussions with consultants, evaluation of patient's response to treatment, examination of patient, obtaining history from patient or surrogate, ordering and performing treatments and interventions, ordering and review of laboratory studies, ordering and review of radiographic studies, pulse oximetry and re-evaluation of patient's condition.     Lindie Spruce Epilepsy [facility] For questions after 5pm please refer to AMION to reach the Neurologist on call

## 2020-06-11 NOTE — Progress Notes (Signed)
vLTM EEG d/c Pt will be going to MRI No skin breakdown

## 2020-06-11 NOTE — Progress Notes (Signed)
Palliative:  HPI: 45 y.o. 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 Noted to have chest pain and seizure activity prior to arrest. Also with pulseless VT with one shock and CPR x 2 minutes in ED. Concern for myoclonus, seizures, lack of brainstem reflexes, anoxic brain injury. Now with EF 15-20%, global hypokinesis, grade 3 diastolic dysfunction and mild RV failure.    I met today with Mr. Weniger family along with Marni Griffon, NP. Mr. Tousley father Venora Maples, sister Seth Bake and Loree Fee, estranged wife, and an aunt (via telephone). He has a son arriving from out of town later today and they are working with [employer] to have another son released to come visit as well (currently in [place]). Laurey Arrow explained to family fully concern for brain damage from prolonged downtime and expectations and paths moving forward. Neurological damage complicated by known heart failure that has worsened as well. He explained signs we have seen that concern [place] for very poor prognosis and QOL. We discussed concern that Mr. Bonet will not return to previous functional status and concern for his ability to protect his airway, talk, eat/drink, walk. Concern that we anticipate that parts of his brain that make him who he is have likely been damaged. At this time they all agree with DNR status if Mr. Grivas were to further decline.   Family also seem to be leaning to a more comfort care approach vs prolonged life support with tracheostomy is testing and examination continue to point [place] to poor neurological prognosis. They were encouraged to continue these discussions as a family. Venora Maples shared that they have had to make these decisions before with Mr. Mccollam' mother. Family are appropriately tearful for the difficult situation  and decisions they face.   All questions/concerns addressed. Emotional support provided.   Exam: Unresponsive on vent with continuous EEG with sedation for discharges (improved today with current sedation). Gag reflex absent. No movement or withdrawal to stimuli. Tolerating vent support. Abd flat.   Plan: - DNR decided.  - Family leaning towards comfort path but no firm decisions today.  - I will follow for ongoing conversations and support to family.   Lyle, NP Palliative Medicine Team Pager [phone] (Please see [email or web address] for schedule) Team Phone [phone]    Greater than 50%  of this time was spent counseling and coordinating care related to the above assessment and plan

## 2020-06-11 NOTE — Procedures (Signed)
Patient Name: Derrick Hoffman  MRN: [id number]  Epilepsy Attending: Charlsie Quest  Referring Physician/Provider:  Duration: 06/10/2020 1223 to 06/11/2020 1233  Patient history: 45 year old male status post cardiac arrest.  EEG generally for seizures.  Level of alertness: comatose  AEDs during EEG study: Keppra, Depakote, propofol, versed  Technical aspects: This EEG study was done with scalp electrodes positioned according to the 10-20 International system of electrode placement. Electrical activity was acquired at a sampling rate of 500Hz  and reviewed with a high frequency filter of 70Hz  and a low frequency filter of 1Hz . EEG data were recorded continuously and digitally stored.   Description: EEG initially showed generalized periodic discharges with triphasic morphology at 2 to 3 Hz.  Gradually the periodic discharges abated and EEG showed continuous generalized 6 to 8 Hz theta-alpha activity as well as intermittent generalized 2 to 3 Hz delta slowing. EEG then gradually progressed to background suppression Hyperventilation and photic stimulation were not performed.     ABNORMALITY -Continuous slow, generalized - Background suppression, generalized   IMPRESSION: This study  is suggestive of severe to profound  diffuse encephalopathy, nonspecific etiology but likely related to anoxix-hypoxic brain injury.  No seizures or definite epileptiform discharges were seen during the study.  Kavari Parrillo 

## 2020-06-11 NOTE — Progress Notes (Signed)
EEG maint complete. No skin breakdown at electrode site. FP1 FP2 A1. Continue to monitor

## 2020-06-11 NOTE — Progress Notes (Signed)
ANTICOAGULATION CONSULT NOTE  Pharmacy Consult for heparin Indication: chest pain/ACS  Labs: Recent Labs    07/01/2020 1553 07/01/20 1808 01-Jul-2020 1826 Jul 01, 2020 1954 July 01, 2020 1954 06/09/20 0400 06/09/20 0721 06/09/20 1108 06/10/20 0402 06/10/20 0540 06/10/20 1054 06/10/20 1520 06/10/20 1654 06/11/20 0355  HGB 16.9   < >  --  16.5  --  17.2*   < >  --  15.6  --   --  16.7  --  15.0  HCT 54.2*   < >  --  49.1  --  50.3   < >  --  49.1  --   --  49.0  --  45.8  PLT 270  --   --  284  --  281  --   --  209  --   --   --   --  206  LABPROT 14.0  --   --   --   --   --   --   --   --   --   --   --   --   --   INR 1.1  --   --   --   --   --   --   --   --   --   --   --   --   --   HEPARINUNFRC  --   --   --   --    < > 0.57  --  0.68  --  0.39  --   --   --  0.29*  CREATININE 1.63*  --   --  1.42*  --  1.57*  --   --  1.69*  --   --   --  1.29*  --   CKTOTAL  --   --   --   --   --   --   --   --   --   --  2,056*  --   --   --   CKMB  --   --   --   --   --   --   --   --   --   --  22.5*  --   --   --   TROPONINIHS 241*  --  9,095* 13,260*  --  18,068*  --   --   --   --   --   --   --   --    < > = values in this interval not displayed.   Assessment: 45 year old male admitted post cardiac arrest with approximately 30 minutes of downtime. Patient now with elevated hs troponin to >9k. Heparin started for ACS. Heparin level this am 0.29 units/ml  Goal of Therapy:  Heparin level 0.3-0.7 units/ml Monitor platelets by anticoagulation protocol: Yes   Plan:  Increase heparin to 1450 units/hr Daily heparin level and CBC Thanks for allowing pharmacy to be a part of this patient's care.  Talbert Cage, PharmD Clinical Pharmacist

## 2020-06-12 DIAGNOSIS — G40B11 Juvenile myoclonic epilepsy, intractable, with status epilepticus: Secondary | ICD-10-CM

## 2020-06-12 LAB — HEPATIC FUNCTION PANEL
ALT: 76 U/L — ABNORMAL HIGH (ref 0–44)
AST: 136 U/L — ABNORMAL HIGH (ref 15–41)
Albumin: 3 g/dL — ABNORMAL LOW (ref 3.5–5.0)
Alkaline Phosphatase: 99 U/L (ref 38–126)
Bilirubin, Direct: 1 mg/dL — ABNORMAL HIGH (ref 0.0–0.2)
Indirect Bilirubin: 1.1 mg/dL — ABNORMAL HIGH (ref 0.3–0.9)
Total Bilirubin: 2.1 mg/dL — ABNORMAL HIGH (ref 0.3–1.2)
Total Protein: 6.7 g/dL (ref 6.5–8.1)

## 2020-06-12 LAB — BASIC METABOLIC PANEL
Anion gap: 9 (ref 5–15)
BUN: 17 mg/dL (ref 6–20)
CO2: 25 mmol/L (ref 22–32)
Calcium: 8.9 mg/dL (ref 8.9–10.3)
Chloride: 105 mmol/L (ref 98–111)
Creatinine, Ser: 1.04 mg/dL (ref 0.61–1.24)
GFR, Estimated: >60 mL/min (ref >60)
Glucose, Bld: 109 mg/dL — ABNORMAL HIGH (ref 70–99)
Potassium: 3.8 mmol/L (ref 3.5–5.1)
Sodium: 139 mmol/L (ref 135–145)

## 2020-06-12 LAB — CBC
HCT: 43.8 % (ref 39.0–52.0)
Hemoglobin: 13.8 g/dL (ref 13.0–17.0)
MCH: 33.2 pg (ref 26.0–34.0)
MCHC: 31.5 g/dL (ref 30.0–36.0)
MCV: 105.3 fL — ABNORMAL HIGH (ref 80.0–100.0)
Platelets: 175 10*3/uL (ref 150–400)
RBC: 4.16 MIL/uL — ABNORMAL LOW (ref 4.22–5.81)
RDW: 15.9 % — ABNORMAL HIGH (ref 11.5–15.5)
WBC: 11.7 10*3/uL — ABNORMAL HIGH (ref 4.0–10.5)
nRBC: 0 % (ref 0.0–0.2)

## 2020-06-12 LAB — GLUCOSE, CAPILLARY
Glucose-Capillary: 106 mg/dL — ABNORMAL HIGH (ref 70–99)
Glucose-Capillary: 113 mg/dL — ABNORMAL HIGH (ref 70–99)
Glucose-Capillary: 124 mg/dL — ABNORMAL HIGH (ref 70–99)
Glucose-Capillary: 128 mg/dL — ABNORMAL HIGH (ref 70–99)
Glucose-Capillary: 129 mg/dL — ABNORMAL HIGH (ref 70–99)

## 2020-06-12 LAB — TRIGLYCERIDES: Triglycerides: 308 mg/dL — ABNORMAL HIGH (ref <150)

## 2020-06-12 LAB — MAGNESIUM: Magnesium: 1.7 mg/dL (ref 1.7–2.4)

## 2020-06-12 LAB — PHOSPHORUS: Phosphorus: 2.9 mg/dL (ref 2.5–4.6)

## 2020-06-12 MED ORDER — GLYCOPYRROLATE 1 MG PO TABS
1.0000 mg | ORAL_TABLET | ORAL | Status: DC | PRN
  Filled 2020-06-12: qty 1

## 2020-06-12 MED ORDER — DIPHENHYDRAMINE HCL 50 MG/ML IJ SOLN: 25.0000 mg | INTRAMUSCULAR | Status: DC | PRN

## 2020-06-12 MED ORDER — MORPHINE SULFATE (PF) 4 MG/ML IV SOLN: 4.0000 mg | INTRAVENOUS | Status: DC | PRN

## 2020-06-12 MED ORDER — MIDAZOLAM 50MG/50ML (1MG/ML) PREMIX INFUSION
0.0000 mg/h | INTRAVENOUS | Status: DC
  Administered 2020-06-12: 1 mg/h via INTRAVENOUS
  Filled 2020-06-12 (×2): qty 50

## 2020-06-12 MED ORDER — GLYCOPYRROLATE 0.2 MG/ML IJ SOLN
0.6000 mg | INTRAMUSCULAR | Status: DC
  Administered 2020-06-12: 0.6 mg via INTRAVENOUS
  Filled 2020-06-12: qty 3

## 2020-06-12 MED ORDER — MAGNESIUM SULFATE 4 GM/100ML IV SOLN: 4.0000 g | Freq: Once | INTRAVENOUS | Status: DC

## 2020-06-12 MED ORDER — CHLORHEXIDINE GLUCONATE 0.12 % MT SOLN
OROMUCOSAL | Status: AC
  Administered 2020-06-12: 15 mL
  Filled 2020-06-12: qty 15

## 2020-06-12 MED ORDER — MORPHINE SULFATE (PF) 4 MG/ML IV SOLN
4.0000 mg | Freq: Once | INTRAVENOUS | Status: AC
  Administered 2020-06-12: 4 mg via INTRAVENOUS
  Filled 2020-06-12: qty 1

## 2020-06-12 MED ORDER — MIDAZOLAM BOLUS VIA INFUSION (WITHDRAWAL LIFE SUSTAINING TX)
4.0000 mg | INTRAVENOUS | Status: DC | PRN
  Filled 2020-06-12: qty 4

## 2020-06-12 MED ORDER — GLYCOPYRROLATE 0.2 MG/ML IJ SOLN: 0.2000 mg | INTRAMUSCULAR | Status: DC | PRN

## 2020-06-12 MED ORDER — POLYVINYL ALCOHOL 1.4 % OP SOLN: 1.0000 [drp] | Freq: Four times a day (QID) | OPHTHALMIC | Status: DC | PRN

## 2020-06-12 MED ORDER — MIDAZOLAM BOLUS VIA INFUSION (WITHDRAWAL LIFE SUSTAINING TX)
2.0000 mg | INTRAVENOUS | Status: DC | PRN
  Filled 2020-06-12: qty 2

## 2020-06-12 MED ORDER — METHYLPREDNISOLONE SODIUM SUCC 40 MG IJ SOLR
40.0000 mg | Freq: Once | INTRAMUSCULAR | Status: DC
  Filled 2020-06-12: qty 1

## 2020-06-12 MED ORDER — MIDAZOLAM HCL 2 MG/2ML IJ SOLN: 1.0000 mg | INTRAMUSCULAR | Status: DC | PRN

## 2020-06-12 MED ORDER — HYDROMORPHONE HCL 1 MG/ML IJ SOLN: 2.0000 mg | INTRAMUSCULAR | Status: DC | PRN

## 2020-06-12 MED ORDER — GLYCOPYRROLATE 0.2 MG/ML IJ SOLN
0.2000 mg | INTRAMUSCULAR | Status: DC | PRN
  Administered 2020-06-12: 0.2 mg via INTRAVENOUS
  Filled 2020-06-12: qty 1

## 2020-06-12 MED ORDER — FENTANYL BOLUS VIA INFUSION
100.0000 ug | INTRAVENOUS | Status: DC | PRN
  Filled 2020-06-12: qty 200

## 2020-07-02 NOTE — Progress Notes (Signed)
This chaplain responded to PMT referral for spiritual care for the family as they are arriving at the Pt. bedside.  The Pt. son is joined by his mother and sister.    The chaplain introduced herself and recognized the need for a spiritual pause as the family listens to and holds Dr. Ophelia Charter update. The Pt. son describes his father as "Super Man" and identifies with the tattoo on the Pt. chest.   In the same visit, chaplain joined PMT NP-AP with her F/U visit.  Introductions were made to the Pt. sister-Whitney and the family anticipates the Pt. father-Andre will arrive soon.   The chaplain shared contact information with Whitney for F/U spiritual care as needed.

## 2020-07-02 NOTE — Progress Notes (Signed)
   NAME:  Derrick Hoffman, MRN:  [id number], DOB:  03-18-1976, LOS: 4 ADMISSION DATE:  23-Jun-2020, CONSULTATION DATE:  06-23-2020 REFERRING MD:  Dr.Rees, CHIEF COMPLAINT:  Cardiac arrest   Brief History:  45yo male admitted post cardiac arrest, estimated downtime ~33mins   Past Medical History:  Smoker HTN Steatohepatitis Anxiety Alcohol use  Significant Hospital Events:  2020/06/23 admitted 2/6 developed myoclonic appearing activity. EEG obtained. Showed status. Neuro called. They felt findings c/w anoxic brain injury w/ post-anoxic myoclonic status. Loaded Keppra. Later in evening. GPEDs noted. VPA loaded and keppra increased.  2/8: MRI completed showed diffuse anoxic injury.  Family meeting completed.  Goals of care discussed.  Full DNR with no escalation.  Working towards extubation when all family arrives Consults:  Cardiology  Procedures:    Significant Diagnostic Tests:  CXR 2/5 > negative CT head 2/5 > negative CT angio chest/ang/pelvis 2/5 > negative for acute dissection insufficient contrast to evaluate for PE 2/6 eeg: + status epilepticus  MRI 2/8 consistent with diffuse anoxic injury Micro Data:  COVID 2/5 > neg  Antimicrobials:     Interim History / Subjective:  No changes Objective   Blood pressure 138/85, pulse (Abnormal) 107, temperature 100 F (37.8 C), temperature source Axillary, resp. rate 18, height 6\' 2"  (1.88 m), weight 102.5 kg, SpO2 98 %.    Vent Mode: PRVC FiO2 (%):  [30 %] 30 % Set Rate:  [12 bmp] 12 bmp Vt Set:  [650 mL] 650 mL PEEP:  [5 cmH20] 5 cmH20 Plateau Pressure:  [14 cmH20-18 cmH20] 16 cmH20   Intake/Output Summary (Last 24 hours) at 06/09/2020 0844 Last data filed at 06/13/2020 0600 Gross per 24 hour  Intake 2673.33 ml  Output 1405 ml  Net 1268.33 ml   Filed Weights   06/23/2020 2000 06/09/20 0205 06/10/20 0500  Weight: 100.5 kg 100.5 kg 102.5 kg    Examination:  General remains minimally responsive HEENT orally intubated Pulmonary  diminished bases Cardiac slightly tachycardic Abdomen soft Extremities warm Neuro GCS 3  Resolved Hospital Problem list   AKI resolved w/ IVFs 2/8 Anion gap metabolic acidosis w/ lactic acidosis-->Resolves as of 2/8  Assessment & Plan:  PEA arrest, NSTEMI- in setting of ETOH related systolic CM w/ acute reduction of systolic fxn.  Acute respiratory failure w/ ventilator dependence s/p PEA arrest Anoxic encephalopathy with myoclonic status: confirmed via MRI 2/8  Baseline HTN, EtOH cardiomyopathy, MJ abuse, HLD  Discussion MRI finding discussed w/ family last night. They will not want to continue aggressive support given these findings confirm what clinical exam and EEG. Still awaiting second son to arrive from [facility] current level of care Anticipate extubation later today or tomorrow and transition to full comfort     Best practice (evaluated daily)  Diet: start TF Pain/Anxiety/Delirium protocol (if indicated): prop/fent, RASS goal -4 to -5 VAP protocol (if indicated): in place DVT prophylaxis: heparin gtt GI prophylaxis: ppi Glucose control: ssi Mobility: BR Disposition:ICU  Goals of Care:  Will update as schedule allows Full scope of care with discussion 06/23/2020  My cct 32 min  08/06/20 ACNP-BC [facility] Pulmonary/Critical Care Pager # [phone] OR # [phone] if no answer

## 2020-07-02 NOTE — Plan of Care (Signed)
Patient's family is at bedside and ready to terminally withdraw per bedside RN.  Minister had prayed with him in the room. NP Tanja Port went to bedside to discuss plan with family. Withdraw of life orders have been placed.  Steffanie Dunn, DO 07-04-20 1:30 PM [facility]

## 2020-07-02 NOTE — Procedures (Signed)
Extubation Procedure Note  Patient Details:   Name: Derrick Hoffman DOB: August 13, 1975 MRN: [id number]   Airway Documentation:    Vent end date: 07/05/20 Vent end time: 1525   Evaluation  O2 sats: stable throughout Complications: No apparent complications Patient did tolerate procedure well. Bilateral Breath Sounds: Rhonchi,Diminished   No, pt could not speak post extubation.  Pt extubated to room air per physician's order and in accordance with the family's wishes.  [name] 07/05/20, 3:33 PM

## 2020-07-02 NOTE — Care Plan (Signed)
Reviewed MRI brain from 06/11/2020: Findings consistent with severe hypoxic/ischemic injury with diffuse edema and restricted diffusion involving the cerebral cortex and bilateral basal ganglia     Discussed with critical care NPP back call.  Plan is to transition patient to comfort care once patient's family arrives.  I agree with this plan given the history, MRI and EEG findings.  Neuro will sign off.  Please call us if needed.  Yazleen Molock Annabelle Harman

## 2020-07-02 NOTE — Progress Notes (Signed)
Expiration Note: 1737 Death pronounced w/Christine Hinshaw,RN and Jontavious Commons,RN.  Auscultation x1 full minute w/o breath sounds or heart rhythm/tones noted.  Family aware at bedside.  Dr. Chestine Spore made aware.

## 2020-07-02 NOTE — Progress Notes (Signed)
Wasted 50 mls IV Versed w/Princess,RN via stericycle.

## 2020-07-02 NOTE — Discharge Summary (Signed)
Physician Discharge Summary  Patient ID: Derrick Hoffman MRN: [id number] DOB/AGE: [birth year] 45 y.o.  Admit date: Jun 17, 2020 Discharge date: 21-Jun-2020  Admission Diagnoses: Cardiac arrest Acute respiratory failure with hypoxia Acute HFrEF NICM due to ETOH abuse NSTEMI HTN HLD Shock- likely cardiogenic  History of marijuana abuse  Discharge Diagnoses:  Active Problems:   Cardiac arrest (HCC)   Acute respiratory failure (HCC) status myoclonus Anoxic encephalopathy Elevated transaminases Nonischemic cardiomyopathy Acute on chronic HFrEF NSTEMI Marijuana abuse HLD HTN Shock, likely cardiogenic  Discharged Condition: deceased  Hospital Course: Derrick Hoffman was admitted to the hospital post arrest with acute respiratory failure with hypoxia. He was managed with full supportive care measures including vasopressors, TTM with goal normothermia, and fluid and electrolyte management. He was managed on amiodarone to prevent arrhythmias, which he was high risk for due to cardiomyopathy. He was monitoring with cEEG, which demonstrated persistent status myoclonus. He had significant anoxic brain injury confirmed on EEG and MRI. Myoclonus treated with antiepileptics. His comfort was ensured during the time that his family was discussing his goals of care and at the time of his terminal extubation. Derrick Hoffman passed away on 21-Jun-2020 at 1737 with family at bedside.  Consults: Neurology Cardiology Palliative Care  Significant Diagnostic Studies:  17-Jun-2020 CT head:  1. No acute intracranial abnormality. 2. Hyperdense intracranial vessels likely due to administration of contrast for concurrent chest CT angiogram.  CTA C/A/P 06-17-2020: 1. No evidence of thoracic or abdominal aortic aneurysm or dissection. 2. Insufficient pulmonary arterial contrast enhancement to evaluate for pulmonary emboli. 3. Diffuse hepatic steatosis.  MRI brain 06/11/20: Findings consistent with severe hypoxic/ischemic injury  with diffuse edema and restricted diffusion involving the cerebral cortex and bilateral basal ganglia, as described above.  cEEG 06/10/20: This study showed periodic discharges with triphasic morphology 2 to 3 Hz which at times are rhythmic with waxing waning amplitude. This pattern is on the ictal-interictal continuum with high potential for seizures.  Additionally there was evidence of severe diffuse encephalopathy, nonspecific etiology.  EEG 06/11/20: This study is suggestive of severe to profound  diffuse encephalopathy, nonspecific etiology but likely related to anoxix-hypoxic brain injury.  No seizures or definite epileptiform discharges were seen during the study.   Treatments:  Mechanical ventilation and respiratory support Continuous EEG monitoring Continuous cardiac monitoring Antiarrhythmics anticoagulation Vasopressors Analgesia Anti-epileptics Supplemental nutrition Insulin Electrolyte management   Disposition: funeral home of the family's choosing  Signed: Steffanie Dunn 06/13/2020, 1:37 PM

## 2020-07-02 NOTE — Progress Notes (Signed)
Wasted IV Versed and Fentanyl via pyxis w/Shaun K. RN

## 2020-07-02 NOTE — Progress Notes (Signed)
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 Also with pulseless VT with one shock and CPR x 2 minutes in ED. Concern for myoclonus, seizures, lack of brainstem reflexes, anoxic brain injury. Now with EF 15-20%, global hypokinesis, grade 3 diastolic dysfunction and mild RV failure.   I met today with Mr. Biela family. They have made the decision to proceed with one way extubation and comfort care. I assisted to negotiate between family and nursing staff visitation which had become very complicated and family called me distressed. Offered support to family and staff to better provide comfort to Ms. Kondracki throughout extubation and end of life process. He had severe dyspnea which improved with medication adjustment. Unfortunately his breathing pattern was distressing to family and I explained that this seems to be more a side effect from neurological injury instead of distress and discomfort. I reassured them that he appears comfortable with no grimacing or tense muscles and has medication to ensure he is sleeping and comfortable despite the appearance of his breathing pattern and secretions. Nursing to bedside throughout end of life process as well - appreciate assistance.   All questions/concerns addressed. Emotional support provided.   Exam: Unresponsive. Severe dyspnea, tachypnea, accessory muscle use, secretions improved with medication. Actively dying.   Plan: - Full comfort care.  - Actively dying. - Hospital death anticipated.   [place], NP Palliative Medicine Team Pager [phone] (Please see [email or web address] for schedule) Team Phone [phone]    Greater than 50%  of  this time was spent counseling and coordinating care related to the above assessment and plan

## 2020-07-02 NOTE — Progress Notes (Signed)
Luisa Hart at [facility] states family has declined donation. Body may go to funeral home

## 2020-07-02 DEATH — deceased

## 2020-07-12 ENCOUNTER — Telehealth: Payer: Self-pay | Admitting: Internal Medicine
